# Patient Record
Sex: Female | Born: 1935 | Race: White | Hispanic: No | Smoking: Former smoker
Health system: Southern US, Community
[De-identification: ages and names within clinical notes are randomized; demographics above are authoritative.]

## PROBLEM LIST (undated history)

## (undated) DIAGNOSIS — I272 Pulmonary hypertension, unspecified: Secondary | ICD-10-CM

## (undated) DIAGNOSIS — I5032 Chronic diastolic (congestive) heart failure: Secondary | ICD-10-CM

## (undated) DIAGNOSIS — F0390 Unspecified dementia without behavioral disturbance: Secondary | ICD-10-CM

## (undated) DIAGNOSIS — N183 Chronic kidney disease, stage 3 (moderate): Secondary | ICD-10-CM

## (undated) DIAGNOSIS — I2510 Atherosclerotic heart disease of native coronary artery without angina pectoris: Secondary | ICD-10-CM

## (undated) DIAGNOSIS — I480 Paroxysmal atrial fibrillation: Secondary | ICD-10-CM

## (undated) DIAGNOSIS — I639 Cerebral infarction, unspecified: Secondary | ICD-10-CM

## (undated) DIAGNOSIS — I701 Atherosclerosis of renal artery: Secondary | ICD-10-CM

## (undated) DIAGNOSIS — R918 Other nonspecific abnormal finding of lung field: Secondary | ICD-10-CM

## (undated) DIAGNOSIS — I509 Heart failure, unspecified: Secondary | ICD-10-CM

## (undated) DIAGNOSIS — I4891 Unspecified atrial fibrillation: Secondary | ICD-10-CM

## (undated) DIAGNOSIS — I10 Essential (primary) hypertension: Secondary | ICD-10-CM

## (undated) DIAGNOSIS — A4181 Sepsis due to Enterococcus: Secondary | ICD-10-CM

## (undated) DIAGNOSIS — E039 Hypothyroidism, unspecified: Secondary | ICD-10-CM

## (undated) DIAGNOSIS — W5501XA Bitten by cat, initial encounter: Secondary | ICD-10-CM

## (undated) DIAGNOSIS — S300XXA Contusion of lower back and pelvis, initial encounter: Secondary | ICD-10-CM

## (undated) DIAGNOSIS — I716 Thoracoabdominal aortic aneurysm, without rupture: Secondary | ICD-10-CM

## (undated) HISTORY — PX: OTHER SURGICAL HISTORY: SHX169

## (undated) HISTORY — PX: ABDOMINAL HYSTERECTOMY: SHX81

## (undated) HISTORY — DX: Bitten by cat, initial encounter: W55.01XA

---

## 2000-06-07 ENCOUNTER — Ambulatory Visit (HOSPITAL_COMMUNITY): Admission: RE | Admit: 2000-06-07 | Discharge: 2000-06-08 | Payer: Self-pay | Admitting: Cardiovascular Disease

## 2000-06-07 ENCOUNTER — Encounter: Payer: Self-pay | Admitting: Cardiovascular Disease

## 2000-06-28 ENCOUNTER — Inpatient Hospital Stay (HOSPITAL_COMMUNITY): Admission: EM | Admit: 2000-06-28 | Discharge: 2000-07-05 | Payer: Self-pay | Admitting: Neurology

## 2000-06-29 ENCOUNTER — Encounter: Payer: Self-pay | Admitting: Neurology

## 2000-06-30 ENCOUNTER — Encounter: Payer: Self-pay | Admitting: Neurology

## 2000-06-30 ENCOUNTER — Encounter: Payer: Self-pay | Admitting: Pediatrics

## 2000-07-04 ENCOUNTER — Encounter: Payer: Self-pay | Admitting: Neurology

## 2000-07-05 ENCOUNTER — Inpatient Hospital Stay (HOSPITAL_COMMUNITY)
Admission: RE | Admit: 2000-07-05 | Discharge: 2000-07-15 | Payer: Self-pay | Admitting: Physical Medicine & Rehabilitation

## 2000-07-06 ENCOUNTER — Encounter: Payer: Self-pay | Admitting: Physical Medicine & Rehabilitation

## 2000-07-13 ENCOUNTER — Encounter: Payer: Self-pay | Admitting: Physical Medicine & Rehabilitation

## 2000-08-14 ENCOUNTER — Ambulatory Visit (HOSPITAL_COMMUNITY): Admission: RE | Admit: 2000-08-14 | Discharge: 2000-08-14 | Payer: Self-pay | Admitting: Neurology

## 2000-08-14 ENCOUNTER — Encounter: Payer: Self-pay | Admitting: Neurology

## 2000-10-03 ENCOUNTER — Other Ambulatory Visit: Admission: RE | Admit: 2000-10-03 | Discharge: 2000-10-03 | Payer: Self-pay | Admitting: Internal Medicine

## 2001-06-14 ENCOUNTER — Emergency Department (HOSPITAL_COMMUNITY): Admission: EM | Admit: 2001-06-14 | Discharge: 2001-06-14 | Payer: Self-pay | Admitting: Emergency Medicine

## 2001-06-14 ENCOUNTER — Encounter: Payer: Self-pay | Admitting: Emergency Medicine

## 2002-01-08 ENCOUNTER — Emergency Department (HOSPITAL_COMMUNITY): Admission: EM | Admit: 2002-01-08 | Discharge: 2002-01-08 | Payer: Self-pay | Admitting: Emergency Medicine

## 2003-10-13 ENCOUNTER — Emergency Department (HOSPITAL_COMMUNITY): Admission: EM | Admit: 2003-10-13 | Discharge: 2003-10-14 | Payer: Self-pay | Admitting: Emergency Medicine

## 2003-10-16 ENCOUNTER — Ambulatory Visit (HOSPITAL_COMMUNITY): Admission: RE | Admit: 2003-10-16 | Discharge: 2003-10-16 | Payer: Self-pay | Admitting: Emergency Medicine

## 2004-02-11 ENCOUNTER — Ambulatory Visit (HOSPITAL_COMMUNITY): Admission: RE | Admit: 2004-02-11 | Discharge: 2004-02-11 | Payer: Self-pay | Admitting: Cardiovascular Disease

## 2004-09-23 ENCOUNTER — Emergency Department (HOSPITAL_COMMUNITY): Admission: EM | Admit: 2004-09-23 | Discharge: 2004-09-23 | Payer: Self-pay | Admitting: Emergency Medicine

## 2006-02-04 ENCOUNTER — Ambulatory Visit (HOSPITAL_COMMUNITY): Admission: RE | Admit: 2006-02-04 | Discharge: 2006-02-04 | Payer: Self-pay | Admitting: Family Medicine

## 2009-11-23 ENCOUNTER — Inpatient Hospital Stay (HOSPITAL_COMMUNITY): Admission: EM | Admit: 2009-11-23 | Discharge: 2009-12-01 | Payer: Self-pay | Admitting: Emergency Medicine

## 2009-11-23 ENCOUNTER — Encounter (INDEPENDENT_AMBULATORY_CARE_PROVIDER_SITE_OTHER): Payer: Self-pay | Admitting: Cardiovascular Disease

## 2009-11-26 ENCOUNTER — Ambulatory Visit: Payer: Self-pay | Admitting: Surgery

## 2009-12-05 ENCOUNTER — Emergency Department (HOSPITAL_COMMUNITY): Admission: EM | Admit: 2009-12-05 | Discharge: 2009-12-05 | Payer: Self-pay | Admitting: Emergency Medicine

## 2009-12-23 ENCOUNTER — Ambulatory Visit: Payer: Self-pay | Admitting: Surgery

## 2009-12-23 ENCOUNTER — Encounter: Admission: RE | Admit: 2009-12-23 | Discharge: 2009-12-23 | Payer: Self-pay | Admitting: Surgery

## 2010-03-02 ENCOUNTER — Inpatient Hospital Stay (HOSPITAL_COMMUNITY): Admission: EM | Admit: 2010-03-02 | Discharge: 2010-03-06 | Payer: Self-pay | Admitting: Emergency Medicine

## 2010-03-08 ENCOUNTER — Emergency Department (HOSPITAL_COMMUNITY): Admission: EM | Admit: 2010-03-08 | Discharge: 2010-03-08 | Payer: Self-pay | Admitting: Emergency Medicine

## 2010-03-28 ENCOUNTER — Emergency Department (HOSPITAL_COMMUNITY): Admission: EM | Admit: 2010-03-28 | Discharge: 2010-03-29 | Payer: Self-pay | Admitting: Emergency Medicine

## 2010-05-26 ENCOUNTER — Emergency Department (HOSPITAL_COMMUNITY)
Admission: EM | Admit: 2010-05-26 | Discharge: 2010-05-26 | Payer: Self-pay | Source: Home / Self Care | Admitting: Emergency Medicine

## 2010-06-23 ENCOUNTER — Ambulatory Visit: Payer: Self-pay | Admitting: Surgery

## 2010-06-23 ENCOUNTER — Encounter
Admission: RE | Admit: 2010-06-23 | Discharge: 2010-06-23 | Payer: Self-pay | Source: Home / Self Care | Attending: Surgery | Admitting: Surgery

## 2010-07-19 ENCOUNTER — Encounter: Payer: Self-pay | Admitting: Cardiology

## 2010-07-20 ENCOUNTER — Inpatient Hospital Stay (HOSPITAL_COMMUNITY)
Admission: EM | Admit: 2010-07-20 | Discharge: 2010-07-28 | Disposition: A | Discharge status: Home / Self Care | Payer: Self-pay | Source: Home / Self Care | Attending: Cardiology | Admitting: Cardiology

## 2010-07-20 ENCOUNTER — Inpatient Hospital Stay (HOSPITAL_COMMUNITY)
Admission: EM | Admit: 2010-07-20 | Discharge: 2010-07-20 | Disposition: A | Discharge status: Other Acute Inpatient Hospital | Payer: Self-pay | Source: Home / Self Care

## 2010-07-21 LAB — POCT CARDIAC MARKERS
Myoglobin, poc: >500 ng/mL (ref 12–200)
Troponin i, poc: <0.05 ng/mL (ref 0.00–0.09)

## 2010-07-21 LAB — CBC
HCT: 36.2 % (ref 36.0–46.0)
Hemoglobin: 11.9 g/dL — ABNORMAL LOW (ref 12.0–15.0)
MCH: 30.2 pg (ref 26.0–34.0)
RBC: 3.94 MIL/uL (ref 3.87–5.11)
WBC: 5.6 10*3/uL (ref 4.0–10.5)

## 2010-07-21 LAB — COMPREHENSIVE METABOLIC PANEL
Albumin: 3.9 g/dL (ref 3.5–5.2)
Alkaline Phosphatase: 40 U/L (ref 39–117)
CO2: 35 mEq/L — ABNORMAL HIGH (ref 19–32)
Calcium: 9.3 mg/dL (ref 8.4–10.5)
GFR calc Af Amer: 27 mL/min — ABNORMAL LOW (ref >60)
GFR calc non Af Amer: 23 mL/min — ABNORMAL LOW (ref >60)
Glucose, Bld: 103 mg/dL — ABNORMAL HIGH (ref 70–99)
Sodium: 141 mEq/L (ref 135–145)

## 2010-07-21 LAB — DIFFERENTIAL
Basophils Absolute: 0 10*3/uL (ref 0.0–0.1)
Basophils Relative: 1 % (ref 0–1)
Eosinophils Absolute: 0.3 10*3/uL (ref 0.0–0.7)
Eosinophils Relative: 6 % — ABNORMAL HIGH (ref 0–5)
Lymphocytes Relative: 47 % — ABNORMAL HIGH (ref 12–46)

## 2010-07-21 LAB — CK TOTAL AND CKMB (NOT AT ARMC)
CK, MB: 9.3 ng/mL (ref 0.3–4.0)
Total CK: 140 U/L (ref 7–177)

## 2010-07-21 LAB — MAGNESIUM: Magnesium: 2.3 mg/dL (ref 1.5–2.5)

## 2010-07-21 LAB — MRSA PCR SCREENING: MRSA by PCR: NEGATIVE

## 2010-07-21 LAB — BRAIN NATRIURETIC PEPTIDE: Pro B Natriuretic peptide (BNP): 319 pg/mL — ABNORMAL HIGH (ref 0.0–100.0)

## 2010-07-21 LAB — PROTIME-INR: INR: 1.02 (ref 0.00–1.49)

## 2010-07-22 LAB — BASIC METABOLIC PANEL
BUN: 23 mg/dL (ref 6–23)
CO2: 30 mEq/L (ref 19–32)
CO2: 29 mEq/L (ref 19–32)
Calcium: 8.4 mg/dL (ref 8.4–10.5)
Chloride: 105 mEq/L (ref 96–112)
Creatinine, Ser: 1.58 mg/dL — ABNORMAL HIGH (ref 0.4–1.2)
GFR calc Af Amer: 39 mL/min — ABNORMAL LOW (ref >60)
GFR calc non Af Amer: 27 mL/min — ABNORMAL LOW (ref >60)
Glucose, Bld: 91 mg/dL (ref 70–99)
Glucose, Bld: 97 mg/dL (ref 70–99)
Potassium: 3.8 mEq/L (ref 3.5–5.1)
Sodium: 139 mEq/L (ref 135–145)

## 2010-07-22 LAB — TSH
TSH: 3.547 u[IU]/mL (ref 0.350–4.500)
TSH: 3.686 u[IU]/mL (ref 0.350–4.500)

## 2010-07-22 LAB — LIPID PANEL
HDL: 49 mg/dL (ref >39)
Triglycerides: 108 mg/dL (ref <150)

## 2010-07-22 LAB — T4, FREE: Free T4: 0.42 ng/dL — ABNORMAL LOW (ref 0.80–1.80)

## 2010-07-22 LAB — CARDIAC PANEL(CRET KIN+CKTOT+MB+TROPI): Total CK: 123 U/L (ref 7–177)

## 2010-07-22 LAB — PROTIME-INR
INR: 1.1 (ref 0.00–1.49)
Prothrombin Time: 14.4 seconds (ref 11.6–15.2)

## 2010-07-22 LAB — CBC
Hemoglobin: 10.2 g/dL — ABNORMAL LOW (ref 12.0–15.0)
MCH: 29.1 pg (ref 26.0–34.0)
MCH: 29.2 pg (ref 26.0–34.0)
MCHC: 31.4 g/dL (ref 30.0–36.0)
MCHC: 30.8 g/dL (ref 30.0–36.0)
MCV: 92.9 fL (ref 78.0–100.0)
MCV: 94.6 fL (ref 78.0–100.0)
Platelets: 177 10*3/uL (ref 150–400)
RBC: 3.5 MIL/uL — ABNORMAL LOW (ref 3.87–5.11)
RDW: 14.9 % (ref 11.5–15.5)

## 2010-07-22 LAB — CK TOTAL AND CKMB (NOT AT ARMC)
CK, MB: 13.8 ng/mL (ref 0.3–4.0)
Relative Index: 6.9 — ABNORMAL HIGH (ref 0.0–2.5)
Relative Index: 6.9 — ABNORMAL HIGH (ref 0.0–2.5)
Total CK: 200 U/L — ABNORMAL HIGH (ref 7–177)

## 2010-07-22 LAB — TROPONIN I: Troponin I: 3.46 ng/mL (ref 0.00–0.06)

## 2010-07-22 LAB — HEPARIN LEVEL (UNFRACTIONATED): Heparin Unfractionated: 0.31 IU/mL (ref 0.30–0.70)

## 2010-07-22 LAB — MAGNESIUM: Magnesium: 2.4 mg/dL (ref 1.5–2.5)

## 2010-07-23 LAB — CBC
Hemoglobin: 8.7 g/dL — ABNORMAL LOW (ref 12.0–15.0)
MCV: 93 fL (ref 78.0–100.0)
Platelets: 177 10*3/uL (ref 150–400)
RBC: 3 MIL/uL — ABNORMAL LOW (ref 3.87–5.11)
WBC: 4.7 10*3/uL (ref 4.0–10.5)

## 2010-07-23 LAB — IRON AND TIBC: TIBC: 262 ug/dL (ref 250–470)

## 2010-07-23 LAB — HEPARIN LEVEL (UNFRACTIONATED): Heparin Unfractionated: 0.27 IU/mL — ABNORMAL LOW (ref 0.30–0.70)

## 2010-07-23 LAB — HEMOGLOBIN AND HEMATOCRIT, BLOOD
HCT: 30.5 % — ABNORMAL LOW (ref 36.0–46.0)
Hemoglobin: 9.6 g/dL — ABNORMAL LOW (ref 12.0–15.0)

## 2010-07-23 LAB — PROTIME-INR: Prothrombin Time: 14.3 seconds (ref 11.6–15.2)

## 2010-07-23 NOTE — H&P (Addendum)
Brandy Mcguire, HICKAM             ACCOUNT NO.:  [id number]  MEDICAL RECORD NO.:  [id number]          PATIENT TYPE:  EMS  LOCATION:  ED                            FACILITY:  [facility]  PHYSICIAN:  Elliot Cousin, M.D.    DATE OF BIRTH:  1933-10-22  DATE OF ADMISSION:  07/20/2010 DATE OF DISCHARGE:  LH                             HISTORY & PHYSICAL   PRIMARY CARE PHYSICIAN:  Dr. Assunta Found.  PRIMARY CARDIOLOGIST:  Dr. Nanetta Batty.  PRIMARY VASCULAR SURGEON:  Evelene Croon, M.D.  CHIEF COMPLAINT:  Chest pain and shortness of breath.  HISTORY OF PRESENT ILLNESS:  The patient is a 77 year old woman with a past medical history significant for coronary artery disease, status post stenting in the past, chronic diastolic congestive heart failure, oxygen-dependent COPD, and stage III chronic kidney disease.  She presents to the emergency department today with a chief complaint of chest pain and shortness of breath.  The patient got up this morning in her usual state of health.  When she got up, she became dizzy.  She described the dizziness as a spinning dizziness and also lightheadedness.  Shortly thereafter, she developed chest pain in the substernal area.  This was accompanied by shortness of breath.  She rated the chest pain at that time as  4 or 5/10 in intensity.  She describes it as a pressure.  She took 1 sublingual nitroglycerin, and it decreased the pain to  2/10 in intensity.  She had no associated diaphoresis, nausea, or radiation.  She did acknowledge that she felt her heart racing when she developed chest pain.  During the initial evaluation in the emergency department by Dr. Adriana Simas, the patient was noted to be in atrial fibrillation with a heart rate of 136 beats per minute.  The EKG revealed atrial fibrillation, bifascicular block, and a heart rate of 134 beats per minute.  She was given 10 mg of IV Cardizem.  She converted to normal sinus rhythm.  Her initial cardiac  markers are negative with the exception of a myoglobin of greater than 500.  Her serum sodium is within normal limits at 141, and her serum potassium is low normal at 3.6.  Her BUN is  24, and her creatinine is elevated at 2.14.  She is being admitted for further evaluation and management.  PAST MEDICAL HISTORY: 1. Coronary artery disease with a history of non-ST elevation     myocardial infarction and right coronary artery bare metal stenting     x2 in June 2011 by Dr. Nanetta Batty. 2. Chronic diastolic congestive heart failure. 3. Oxygen-dependent COPD with emphysema. 4. Pulmonary hypertension. 5. Hypertension. 6. Hypothyroidism. 7. Stage III chronic kidney disease. 8. History of renal artery stenosis, status post stent to the left     renal artery in December 2001. 9. Thoracic aortic aneurysm, which is being followed by Dr. Laneta Simmers. 10.Stable pulmonary nodules. 11.Iron deficiency anemia. 12.History of previous strokes.  MEDICATIONS: 1. Levothyroxine 25 mcg daily. 2. Crestor 20 mg q.h.s. 3. Stool softener 100 mg q.h.s. 4. Lasix 20 mg daily. 5. Sublingual nitroglycerin 0.4 mg sublingual every 5 minutes x3 as  needed for chest pain. 6. Fish oil capsule 1000 mg q.h.s. 7. Centrum Silver once daily. 8. Isosorbide mononitrate 30 mg daily. 9. Protonix 40 mg daily. 10.Plavix 75 mg daily. 11.Aspirin 325 mg daily. 12.Symbicort 80/4.5 mg 2 puffs twice daily as needed. 13.Oxygen 3 liters per minute.  ALLERGIES:  NO KNOWN DRUG ALLERGIES.  SOCIAL HISTORY:  The patient is widowed.  [place]  Her son, Gerlene Burdock, lives with her.  She is retired.  She quit smoking in 2002.  She denies alcohol and illicit drug use.  She does not smoke.  She ambulates mostly without a walker.  She has 5 children.  FAMILY HISTORY:  Her mother died of an intestinal cancer at 29 years of age.  Her father died of a blood clot at 60 years of age.  REVIEW OF SYSTEMS:  As  above in the history present illness, otherwise review of systems is negative.  PHYSICAL EXAMINATION:  Temperature 97.7, blood pressure 138/93, pulse 69, respiratory rate 20, oxygen saturation 100% on 3 liters of oxygen. GENERAL: The patient is a pleasant, alert, elderly 77 year old Caucasian woman who is currently sitting up in bed in no acute distress. HEENT:  Head is normocephalic nontraumatic.  Pupils are equal, round, and reactive to light.  Extraocular muscles are intact.  Conjunctivae are clear.  Sclerae are white.  Tympanic membranes not examined.  Nasal mucosa is mildly dry.  No sinus tenderness.  Oropharynx reveals no teeth.  Mucous membranes are moist.  No posterior exudates or erythema. NECK:  Supple.  No adenopathy, no thyromegaly, no bruit or JVD. LUNGS:  Occasional crackles and a few wheezes auscultated bilaterally. Breathing is nonlabored. HEART:  S1-S2 with a soft systolic murmur. ABDOMEN:  Obese positive bowel sounds.  Soft, nontender, nondistended. No hepatosplenomegaly.  No masses palpated. GU/RECTAL:  Deferred. EXTREMITIES:  Pedal pulses are palpable bilaterally.  Only a trace of pedal edema bilaterally.NEUROLOGIC:  The patient is alert and oriented x3.  Cranial nerves II- XII are intact.  Strength is 5/5 throughout.  Sensation is intact.  ADMISSION LABORATORIES:  EKG #1 reveals atrial fibrillation with rapid ventricular response, right bundle branch block, left anterior fascicular block, and a heart rate of 134 beats per minute.  EKG #2 reveals normal sinus rhythm with bifascicular block and a heart rate of 66 beats per minute.  Chest x-ray reveals borderline enlargement of cardiac silhouette. Bronchitic changes.  Sodium 141, potassium 3.6, chloride 97, CO2 35, glucose 103, BUN 24, creatinine 2.14, total bilirubin 0.5, alkaline phosphatase 40, SGOT 27, SGPT 10, total protein 7.9, albumin 3.9, calcium 9.3.  WBC 5.6, hemoglobin 11.9, platelet count  226.  ASSESSMENT: 1. Chest pain with associated shortness of breath and palpitations.     It is likely that the patient's symptoms were secondary to     paroxysmal atrial fibrillation with rapid ventricular response.     However, given her history of coronary artery disease, myocardial     infarction, and/or ischemia are concerns. 2. Coronary artery disease with a history of myocardial infarction and     stenting of the right coronary artery.  The patient is treated with     antiplatelet therapy including Plavix and aspirin.  Her EKG reveals     a bifascicular block. 3. Chronic diastolic congestive heart failure and pulmonary     hypertension per 2-D echocardiogram in May 2011. 4. Oxygen-dependent chronic obstructive pulmonary disease with     emphysema.  The patient has a few  crackles and a couple of wheezes     on exam, but it does not appear that she has exacerbation of her     chronic obstructive pulmonary disease. 5. Stage III chronic kidney disease.  Her creatinine appears to be at     baseline. 6. Chronic pulmonary nodules and  aortic aneurysm.  The     patient is followed once or twice yearly by Dr. Laneta Simmers.  A CT scan     of her chest in December 2011 revealed no significant change in the     thoracic aortic aneurysm.  The pulmonary nodules remained virtually     unchanged. But a follow-up CT of the chest in 6 months was     recommended by the radiologist.  PLAN: 1. The patient was given a 10 mg IV dose of Cardizem.. 2. We will start oral Cardizem at 60 days mg q.8h. initially. 3. We will add Coumadin and hold aspirin.  We will continue Plavix.     We will discuss this further with her cardiologist. 4. We will consult cardiologist, Dr. Allyson Sabal or colleague, for further     management recommendations. 5. For further evaluation, we will order a magnesium level, TSH, free     T4, cardiac enzymes, 2-D echocardiogram, and fasting lipid panel.     Elliot Cousin,  M.D.     DF/MEDQ  D:  07/20/2010  T:  07/20/2010  Job:  [id number]  cc:   Nanetta Batty, M.D. Fax: [phone]  Corrie Mckusick, M.D. Fax: [phone]  Evelene Croon, M.D. [address] [place] [address] [place]  Electronically Signed by Elliot Cousin M.D. on 07/23/2010 03:06:42 PM

## 2010-07-24 LAB — PROTIME-INR: INR: 1.09 (ref 0.00–1.49)

## 2010-07-24 LAB — CBC
HCT: 28.8 % — ABNORMAL LOW (ref 36.0–46.0)
Hemoglobin: 9 g/dL — ABNORMAL LOW (ref 12.0–15.0)
MCV: 92.6 fL (ref 78.0–100.0)
WBC: 5.7 10*3/uL (ref 4.0–10.5)

## 2010-07-24 LAB — BRAIN NATRIURETIC PEPTIDE: Pro B Natriuretic peptide (BNP): 305 pg/mL — ABNORMAL HIGH (ref 0.0–100.0)

## 2010-07-24 LAB — BASIC METABOLIC PANEL
Chloride: 100 mEq/L (ref 96–112)
GFR calc Af Amer: 33 mL/min — ABNORMAL LOW (ref >60)
Potassium: 4.3 mEq/L (ref 3.5–5.1)

## 2010-07-25 LAB — BASIC METABOLIC PANEL
BUN: 31 mg/dL — ABNORMAL HIGH (ref 6–23)
GFR calc Af Amer: 26 mL/min — ABNORMAL LOW (ref >60)
GFR calc non Af Amer: 21 mL/min — ABNORMAL LOW (ref >60)
GFR calc non Af Amer: 17 mL/min — ABNORMAL LOW (ref >60)
Glucose, Bld: 107 mg/dL — ABNORMAL HIGH (ref 70–99)
Potassium: 3.7 mEq/L (ref 3.5–5.1)
Potassium: 4 mEq/L (ref 3.5–5.1)
Sodium: 137 mEq/L (ref 135–145)

## 2010-07-25 LAB — BRAIN NATRIURETIC PEPTIDE: Pro B Natriuretic peptide (BNP): 190 pg/mL — ABNORMAL HIGH (ref 0.0–100.0)

## 2010-07-25 LAB — ABO/RH: ABO/RH(D): O POS

## 2010-07-25 LAB — GLUCOSE, CAPILLARY
Glucose-Capillary: 86 mg/dL (ref 70–99)
Glucose-Capillary: 124 mg/dL — ABNORMAL HIGH (ref 70–99)

## 2010-07-25 LAB — PROTIME-INR
INR: 1.21 (ref 0.00–1.49)
Prothrombin Time: 15.5 seconds — ABNORMAL HIGH (ref 11.6–15.2)

## 2010-07-25 LAB — TYPE AND SCREEN

## 2010-07-25 LAB — CBC
Hemoglobin: 9.1 g/dL — ABNORMAL LOW (ref 12.0–15.0)
MCH: 28.8 pg (ref 26.0–34.0)
MCHC: 30.8 g/dL (ref 30.0–36.0)
Platelets: 214 10*3/uL (ref 150–400)

## 2010-07-25 LAB — TROPONIN I: Troponin I: 0.16 ng/mL — ABNORMAL HIGH (ref 0.00–0.06)

## 2010-07-26 LAB — CBC
Hemoglobin: 8.9 g/dL — ABNORMAL LOW (ref 12.0–15.0)
MCHC: 31.3 g/dL (ref 30.0–36.0)
Platelets: 209 10*3/uL (ref 150–400)
RDW: 14.7 % (ref 11.5–15.5)

## 2010-07-26 LAB — BASIC METABOLIC PANEL
BUN: 31 mg/dL — ABNORMAL HIGH (ref 6–23)
Calcium: 8.6 mg/dL (ref 8.4–10.5)
Creatinine, Ser: 2.57 mg/dL — ABNORMAL HIGH (ref 0.4–1.2)
GFR calc non Af Amer: 18 mL/min — ABNORMAL LOW (ref >60)
Potassium: 3.9 mEq/L (ref 3.5–5.1)

## 2010-07-26 LAB — PROTIME-INR
INR: 1.13 (ref 0.00–1.49)
Prothrombin Time: 14.7 seconds (ref 11.6–15.2)

## 2010-07-26 LAB — CK TOTAL AND CKMB (NOT AT ARMC): Relative Index: INVALID (ref 0.0–2.5)

## 2010-07-27 LAB — BASIC METABOLIC PANEL
CO2: 31 mEq/L (ref 19–32)
Calcium: 9 mg/dL (ref 8.4–10.5)
Chloride: 93 mEq/L — ABNORMAL LOW (ref 96–112)
GFR calc Af Amer: 26 mL/min — ABNORMAL LOW (ref >60)
Sodium: 138 mEq/L (ref 135–145)

## 2010-07-27 LAB — PROTIME-INR
INR: 1.09 (ref 0.00–1.49)
Prothrombin Time: 14.3 seconds (ref 11.6–15.2)

## 2010-07-27 LAB — CBC
Hemoglobin: 9.1 g/dL — ABNORMAL LOW (ref 12.0–15.0)
Platelets: 228 10*3/uL (ref 150–400)
RBC: 3.09 MIL/uL — ABNORMAL LOW (ref 3.87–5.11)

## 2010-07-27 LAB — BRAIN NATRIURETIC PEPTIDE: Pro B Natriuretic peptide (BNP): 178 pg/mL — ABNORMAL HIGH (ref 0.0–100.0)

## 2010-07-27 LAB — FOLATE RBC: RBC Folate: 2218 ng/mL — ABNORMAL HIGH (ref 180–600)

## 2010-07-28 LAB — BASIC METABOLIC PANEL
Calcium: 9.2 mg/dL (ref 8.4–10.5)
GFR calc Af Amer: 29 mL/min — ABNORMAL LOW (ref >60)
GFR calc non Af Amer: 24 mL/min — ABNORMAL LOW (ref >60)
Glucose, Bld: 85 mg/dL (ref 70–99)
Potassium: 4.3 mEq/L (ref 3.5–5.1)
Sodium: 136 mEq/L (ref 135–145)

## 2010-07-28 LAB — CBC
HCT: 28.1 % — ABNORMAL LOW (ref 36.0–46.0)
Hemoglobin: 8.9 g/dL — ABNORMAL LOW (ref 12.0–15.0)
RDW: 14.2 % (ref 11.5–15.5)
WBC: 4.6 10*3/uL (ref 4.0–10.5)

## 2010-07-28 LAB — PROTIME-INR: INR: 1.02 (ref 0.00–1.49)

## 2010-07-29 NOTE — Procedures (Signed)
NAME:  DAFINA, SUK NO.:  [id number]  MEDICAL RECORD NO.:  [id number]          PATIENT TYPE:  INP  LOCATION:  2919                         FACILITY:  [facility]  PHYSICIAN:  Nicki Guadalajara, M.D.     DATE OF BIRTH:  [birth year]  DATE OF PROCEDURE:  07/21/2010 DATE OF DISCHARGE:                           CARDIAC CATHETERIZATION   INDICATIONS:  Ms. Brandy Mcguire is a 77 year old female who had a history of multiple medical problems, which include documented coronary artery disease, renal insufficiency, status post left renal artery stenting with solitary kidney, large thoracoabdominal aneurysm, COPD, hypothyroidism.  She had in May 2011 cardiac catheterization, which revealed 50% to 60% left main stenosis as well as irregularities in the LAD, 50% to 60% proximal circumflex stenosis, and high-grade proximal and distal RCA stenoses.  At that time, she underwent surgical evaluation, was turned down for surgery.  She underwent stenting of the proximal and distal right coronary artery.  The patient presented to [facility] yesterday in rapid atrial fibrillation with associated ST-T changes.  She subsequently converted to sinus rhythm, but has ruled in for non-ST-segment elevation MI with troponin increasing to 4.05 and CK-MB of 13.8.  She is now referred for cardiac catheterization.  She has been hydrated.  PROCEDURE:  After premedication with Versed 12 mg plus fentanyl 25 mcg, the patient was prepped and draped in usual fashion.  The right femoral artery was punctured anteriorly.  Initially, a 5-French sheath was inserted.  Due to a large thoracoabdominal aneurysm, a Glidewire was necessary to navigate the aorta.  This was advanced to the aortic root. A 5-French sheath was then removed and exchanged for a 6-French 35 cm Brite Tip Cordis sheath.  Exchange technique was necessary throughout the entire procedure with long wire.  The left coronary system  was injected with a 6-French FL-4 catheter.  Initially, a 6-French AR-2 catheter was inserted, but this was unable to selectively engage the right coronary artery.  This was changed to a no torque catheter, which also was unsuccessful.  Ultimately, a 6-French XB RCA guide with side holes was necessary for selective cannulation to the right coronary artery.  All exchanges were done with a long wire exchange.  A 6-French pigtail catheter was then inserted and the catheter was able to cross the aortic valve, which was mildly calcified into the left ventricle. Pressures were recorded.  Due to the patient's renal insufficiency with a baseline creatinine of 1.85 and a solitary kidney, left ventriculography was not performed nor was distal aortography to further reevaluate the thoracoabdominal extensive aneurysm as well as prior left renal artery stents.  All wires and catheters were removed from the patient.  Hemostasis was obtained by direct manual pressure.  The patient tolerated the procedure well.  HEMODYNAMIC DATA:  Initial central aortic pressure was 94/57.  Initial LV pressure was 95/50.  Post A-wave 25.  On pullback, LV pressure was 119/7, post A-wave 21, central aortic pressure 125/68.  There was evidence for coronary calcification involving the LAD system, circumflex, and right coronary artery.  The left main coronary artery had previously noted 60% mid stenosis.  The LAD had mild 20% proximal narrowing, 20% mid narrowing.  There were luminal irregularities and 40% distal narrowing.  This gave rise to several diagonal vessels and septal perforating arteries and was free of critical stenoses.  The circumflex vessel had 40% to 50% narrowing proximally on the proximal bend of the vessel before the first marginal branch.  The right coronary artery had ostial calcification.  The stent in the proximal right coronary artery was patent, but there was intimal hyperplasia with narrowing in  the mid segment of about 40% to 50%.  The stent in the distal RCA was patent with intimal hyperplasia narrowing of 30%.  The RCA supplied a PDA and a small PLA vessel with luminal irregularities.  IMPRESSION:  Multivessel coronary artery disease with no significant change in the 60% left main stenosis; calcification of the left anterior descending system with luminal irregularities of 20% in the proximal and mid segment, 40% distal narrowing; 40% to 50% proximal stenosis in the circumflex coronary artery; ostial calcification of the right coronary artery with proximal RCA stent with intimal hyperplasia narrowing of 40% to 50% and 30% narrowing in the distal RCA stent.  RECOMMENDATIONS:  Medical therapy.  The patient did rule in for non-ST- segment elevation myocardial infarction.  Per yesterday's presentation with atrial fibrillation with rapid ventricular response, she did ST-T changes, which improved and normalized following rate control and conversion to sinus rhythm.  Increased medical therapy will be recommended.  The patient will be aggressively hydrated.          ______________________________ Nicki Guadalajara, M.D.     TK/MEDQ  D:  07/21/2010  T:  07/22/2010  Job:  [id number]  cc:   Landry Corporal, MD  Electronically Signed by Nicki Guadalajara M.D. on 07/29/2010 02:40:02 PM

## 2010-08-14 NOTE — Discharge Summary (Signed)
Brandy Mcguire, Brandy Mcguire             ACCOUNT NO.:  [id number]  MEDICAL RECORD NO.:  [id number]          PATIENT TYPE:  INP  LOCATION:  2910                         FACILITY:  [facility]  PHYSICIAN:  Italy Hilty, MD         DATE OF BIRTH:  06-05-1934  DATE OF ADMISSION:  07/20/2010 DATE OF DISCHARGE:  07/28/2010                              DISCHARGE SUMMARY   DISCHARGE DIAGNOSES: 1. Non-ST-elevation myocardial infarction required medical treatment. 2. History of hypertension. 3. Congestive heart failure, diastolic grade one. 4. Chronic kidney disease, stage III. 5. Atrial fibrillation with rapid ventricular response. 6. Coronary artery disease status post bare-metal stent x2 to the     right coronary artery in June 2011. 7. Chronic obstructive pulmonary disease, emphysema, O2 dependent. 8. Hypothyroid. 9. Renal artery stenosis status post stent to the right renal artery     in December 2001. 10.Abdominal aortic aneurysm followed by Dr. Laneta Simmers. 11.Anemia, iron-deficiency. 12.History of cerebrovascular accident.  HOSPITAL COURSE:  Brandy Mcguire is a 77 year old female with past medical history of coronary artery disease status post bare-metal stent to the right coronary artery in June 2011, chronic diastolic congestive heart failure, oxygen-dependent COPD, stage III chronic kidney disease, hypertension, hypothyroidism, renal artery stenosis status post stent to the right renal artery in December 2001, abdominal aortic aneurysm, iron- deficiency anemia, and history of CVA.  She presented to the emergency department on the 23rd with complaints of chest pain and shortness of breath.  Upon evaluation in the ER, she was found to be in atrial fibrillation with bifascicular block, heart rate of 134 beats per minute.  She was started on 10 mg of IV Cardizem and converted to normal sinus rhythm.  She was admitted for cardiac workup for acute coronary syndrome and her atrial fibrillation.  The  patient's peak troponin was 4.05.  She was started on oral Cardizem initially and as well as started on Coumadin and her Plavix was continued and also ordered was a magnesium level, TSH, free T4, additional cardiac enzymes x4, 2-D echocardiogram, and fasting lipid panel.  Brandy Mcguire was subsequently scheduled for cardiac catheterization on July 21, 2010.  At that time, she had no complaints of chest pain.  Cardiac cath revealed multivessel coronary artery disease with no significant change in the 60% left main stenosis.  There were luminal irregularities 20% in the proximal and mid segment of the left anterior descending as well as 40% distal narrowing and 43% proximal stenosis in the circumflex coronary artery.  There was also ostial calcification of the right coronary artery with proximal RCA stent with intimal hyperplasia narrowing of 40% to 50% and 30% narrowing in the distal RCA stent.  Recommendations were for medical therapy.  Cardiac rehabilitation was initiated.  A 2-D echocardiogram on July 22, 2010, revealed ejection fraction of 60% to 65% with mild LVH, normal systolic function, moderate tricuspid regurgitation and PA pressure of 39 mmHg.  On the 26th, the patient was complaining of shortness of breath at rest.  Hemoglobin level at 8.7. Iron studies were completed as well as a Hemoccult of her stool.  Iron and percent  saturation were low.  The nurse called and stated that the patient's blood pressure is 180/106 with pulse of 83.  The patient was given 25 mg of p.o. Lopressor and subsequently 10 mg of IV hydralazine. The patient had a resulting decrease in her blood pressure to 130/80. BNP was also ordered and found to be 305.  The patient was given 40 mg of IV Lasix.  She had good urinary output as a result of over a liter. On the 27th, the patient had no complaints.  The patient was also started on incentive spirometry.  Subsequent BNP checks showed a decrease, the  next one was 190.  The patient continued to deny any chest pain or shortness of breath.  On the 28th, the patient had a systolic blood pressure of 60 and diastolic of 45.  She was given 250 normal saline bolus without change in blood pressure and bolus was repeated. EKG was ordered.  The patient was started on IV dopamine at 3 mg/kg per minute and titrated to keep the systolic pressure greater than 90.  She was transferred to the ICU and Lopressor was held.  On the 29th, the patient's blood pressure was 140/68 with heart rate of 59.  The patient stated she was feeling better and she was off the dopamine.  The patient continued without complaints.  She had been seen by Dr. Rennis Golden on the 31st who feels she is stable for discharge.  The patient feels much better.  DISCHARGE LABS:  WBC is 4.6, hemoglobin 8.9, hematocrit 28.1, platelets 211.  PT 13.6, INR 1.02.  Sodium 136, potassium 4.3, chloride 94, carbon dioxide 33, glucose 85, BUN 25, creatinine 2.02, calcium 9.2.  BNP is 220.0.  Vitamin B12 was 889, folate was 2218.  Total cholesterol 151, triglycerides 108, HDL 49, LDL 80, free T4 was 0.42.  TSH was 3.686.  STUDIES/PROCEDURES: 1. Last chest x-ray was on July 26, 2010.  Impression:  Resolution     of airspace disease with minimal opacity remaining at the right     lung base, most consistent with atelectasis. 2. Cardiac catheterization July 21, 2010.  Impression:  Multivessel     coronary artery disease with no significant change in the 60% left     main stenosis.  Calcification in the left anterior descending     system with luminal irregularities of 20% proximal and mid segment,     40% distal narrowing, 40% to 50% proximal stenosis in the     circumflex coronary artery, ostial calcification of the right     coronary artery within proximal RCA stent with intimal hyperplasia     narrowing of 40% to 50%, 30% narrowing of the distal RCA stent.     Recommendations are for medical  therapy. 3. A 2-D echocardiogram July 22, 2010.  The conclusion is left     ventricle cavity size was normal, wall thickness was increased and     mild LVH.  There was mild concentric hypertrophy.  Systolic     function was normal with an ejection fraction estimated at 60% to     65%.  Normal wall motion.  Tricuspid valve showed moderate     regurgitation.  Pulmonary artery peak PA pressure was 39 mmHg.  DISCHARGE MEDICATIONS: 1. Symbicort 2 puffs inhaled twice daily. 2. Diltiazem 120 mg capsules CD/ER 1 tablet by mouth daily. 3. Iron complex 150 mg 1 tablet by mouth twice daily. 4. Lasix 40 mg 1 tablet by mouth daily.  5. Metoprolol tartrate 25 mg one-half tablet twice daily. 6. MiraLax 17 g by mouth daily over the counter mixed in water. 7. Acetaminophen 325 mg 2 tablets by mouth every 4 hours as needed for     pain. 8. Aspirin 325 mg enteric-coated 1 tablet by mouth daily. 9. Plavix 75 mg 1 tablet by mouth daily with meals. 10.Fish oil over the counter 1 tablet by mouth daily. 11.Imdur 30 mg 1 tablet by mouth daily. 12.Synthroid 25 mcg 1 tablet by mouth daily before breakfast. 13.Multivitamin/iron over the counter 1 tablet by mouth daily. 14.Nitroglycerin sublingual 0.4 mg 1 tablet under the tongue every 5    minutes as needed for chest pain up to 3 doses. 15.Protonix 40 mg 1 tablet by mouth daily. 16.Rosuvastatin 20 mg 1 tablet by mouth daily.  DISPOSITION:  Ms. Solanki will be discharged home in stable condition. She is to increase her activity slowly.  May shower and bathe.  She is to eat a low-sodium heart-healthy diet.  She is to follow up with Dr. Allyson Sabal in [place] on August 13, 2010, at 3:15 p.m.  She has been instructed to follow heart failure instructions on the reverse of the pink sheet. She is advised to call if she has a 1-2 pounds weight gain in 1 day or 3-4 pounds weight gain in a week.    ______________________________ Wilburt Finlay,  PA   ______________________________ [place] Hilty, MD    BH/MEDQ  D:  07/28/2010  T:  07/29/2010  Job:  [id number]  cc:   Evelene Croon, M.D. Nanetta Batty, M.D. Corrie Mckusick, M.D.  Electronically Signed by Wilburt Finlay PA on 08/12/2010 03:27:19 PM Electronically Signed by Kirtland Bouchard. HILTY M.D. on 08/14/2010 11:17:53 AM

## 2010-09-08 LAB — BASIC METABOLIC PANEL
Calcium: 9.6 mg/dL (ref 8.4–10.5)
Chloride: 96 mEq/L (ref 96–112)
Creatinine, Ser: 2.17 mg/dL — ABNORMAL HIGH (ref 0.4–1.2)
GFR calc Af Amer: 27 mL/min — ABNORMAL LOW (ref >60)
Sodium: 140 mEq/L (ref 135–145)

## 2010-09-08 LAB — CBC
MCV: 90.8 fL (ref 78.0–100.0)
Platelets: 225 10*3/uL (ref 150–400)
RBC: 3.81 MIL/uL — ABNORMAL LOW (ref 3.87–5.11)
WBC: 6.3 10*3/uL (ref 4.0–10.5)

## 2010-09-08 LAB — DIFFERENTIAL
Lymphocytes Relative: 46 % (ref 12–46)
Lymphs Abs: 2.9 10*3/uL (ref 0.7–4.0)
Neutrophils Relative %: 39 % — ABNORMAL LOW (ref 43–77)

## 2010-09-08 LAB — POCT CARDIAC MARKERS
Myoglobin, poc: 203 ng/mL (ref 12–200)
Troponin i, poc: <0.05 ng/mL (ref 0.00–0.09)
Troponin i, poc: <0.05 ng/mL (ref 0.00–0.09)

## 2010-09-10 LAB — BASIC METABOLIC PANEL
BUN: 23 mg/dL (ref 6–23)
BUN: 24 mg/dL — ABNORMAL HIGH (ref 6–23)
BUN: 28 mg/dL — ABNORMAL HIGH (ref 6–23)
BUN: 34 mg/dL — ABNORMAL HIGH (ref 6–23)
CO2: 30 mEq/L (ref 19–32)
CO2: 33 mEq/L — ABNORMAL HIGH (ref 19–32)
CO2: 34 mEq/L — ABNORMAL HIGH (ref 19–32)
CO2: 34 mEq/L — ABNORMAL HIGH (ref 19–32)
CO2: 36 mEq/L — ABNORMAL HIGH (ref 19–32)
Calcium: 8.7 mg/dL (ref 8.4–10.5)
Calcium: 8.9 mg/dL (ref 8.4–10.5)
Chloride: 90 mEq/L — ABNORMAL LOW (ref 96–112)
Chloride: 91 mEq/L — ABNORMAL LOW (ref 96–112)
Chloride: 91 mEq/L — ABNORMAL LOW (ref 96–112)
Chloride: 93 mEq/L — ABNORMAL LOW (ref 96–112)
Chloride: 95 mEq/L — ABNORMAL LOW (ref 96–112)
Creatinine, Ser: 1.61 mg/dL — ABNORMAL HIGH (ref 0.4–1.2)
Creatinine, Ser: 1.66 mg/dL — ABNORMAL HIGH (ref 0.4–1.2)
Creatinine, Ser: 2.35 mg/dL — ABNORMAL HIGH (ref 0.4–1.2)
Creatinine, Ser: 2.39 mg/dL — ABNORMAL HIGH (ref 0.4–1.2)
GFR calc Af Amer: 24 mL/min — ABNORMAL LOW (ref >60)
GFR calc Af Amer: 30 mL/min — ABNORMAL LOW (ref >60)
GFR calc non Af Amer: 30 mL/min — ABNORMAL LOW (ref >60)
Glucose, Bld: 94 mg/dL (ref 70–99)
Glucose, Bld: 99 mg/dL (ref 70–99)
Potassium: 3.4 mEq/L — ABNORMAL LOW (ref 3.5–5.1)
Sodium: 134 mEq/L — ABNORMAL LOW (ref 135–145)
Sodium: 137 mEq/L (ref 135–145)

## 2010-09-10 LAB — IRON AND TIBC
Iron: 36 ug/dL — ABNORMAL LOW (ref 42–135)
Saturation Ratios: 12 % — ABNORMAL LOW (ref 20–55)
TIBC: 311 ug/dL (ref 250–470)
UIBC: 275 ug/dL

## 2010-09-10 LAB — CBC
HCT: 31.4 % — ABNORMAL LOW (ref 36.0–46.0)
HCT: 32.2 % — ABNORMAL LOW (ref 36.0–46.0)
Hemoglobin: 10.6 g/dL — ABNORMAL LOW (ref 12.0–15.0)
Hemoglobin: 11.2 g/dL — ABNORMAL LOW (ref 12.0–15.0)
MCH: 30.4 pg (ref 26.0–34.0)
MCH: 30.5 pg (ref 26.0–34.0)
MCV: 91.3 fL (ref 78.0–100.0)
MCV: 92 fL (ref 78.0–100.0)
MCV: 92.6 fL (ref 78.0–100.0)
Platelets: 208 10*3/uL (ref 150–400)
Platelets: 225 10*3/uL (ref 150–400)
RBC: 3.44 MIL/uL — ABNORMAL LOW (ref 3.87–5.11)
RBC: 3.68 MIL/uL — ABNORMAL LOW (ref 3.87–5.11)
RDW: 14.9 % (ref 11.5–15.5)
RDW: 15 % (ref 11.5–15.5)
RDW: 15.1 % (ref 11.5–15.5)
WBC: 4.9 10*3/uL (ref 4.0–10.5)
WBC: 6 10*3/uL (ref 4.0–10.5)

## 2010-09-10 LAB — DIFFERENTIAL
Basophils Absolute: 0 10*3/uL (ref 0.0–0.1)
Basophils Absolute: 0 10*3/uL (ref 0.0–0.1)
Basophils Relative: 0 % (ref 0–1)
Eosinophils Absolute: 0.1 10*3/uL (ref 0.0–0.7)
Eosinophils Absolute: 0.2 10*3/uL (ref 0.0–0.7)
Eosinophils Relative: 0 % (ref 0–5)
Eosinophils Relative: 3 % (ref 0–5)
Eosinophils Relative: 3 % (ref 0–5)
Lymphocytes Relative: 27 % (ref 12–46)
Lymphocytes Relative: 37 % (ref 12–46)
Lymphs Abs: 1.6 10*3/uL (ref 0.7–4.0)
Lymphs Abs: 2 10*3/uL (ref 0.7–4.0)
Lymphs Abs: 2.2 10*3/uL (ref 0.7–4.0)
Monocytes Absolute: 0.8 10*3/uL (ref 0.1–1.0)
Monocytes Relative: 13 % — ABNORMAL HIGH (ref 3–12)
Monocytes Relative: 9 % (ref 3–12)
Neutro Abs: 2.7 10*3/uL (ref 1.7–7.7)
Neutrophils Relative %: 55 % (ref 43–77)

## 2010-09-10 LAB — CARDIAC PANEL(CRET KIN+CKTOT+MB+TROPI)
CK, MB: 6.1 ng/mL (ref 0.3–4.0)
Relative Index: 2.9 — ABNORMAL HIGH (ref 0.0–2.5)
Total CK: 213 U/L — ABNORMAL HIGH (ref 7–177)
Total CK: 243 U/L — ABNORMAL HIGH (ref 7–177)
Troponin I: 0.03 ng/mL (ref 0.00–0.06)

## 2010-09-10 LAB — LIPID PANEL
Cholesterol: 124 mg/dL (ref 0–200)
LDL Cholesterol: 48 mg/dL (ref 0–99)
Total CHOL/HDL Ratio: 2.2 RATIO
Triglycerides: 99 mg/dL (ref <150)
VLDL: 20 mg/dL (ref 0–40)

## 2010-09-10 LAB — POCT CARDIAC MARKERS
Troponin i, poc: <0.05 ng/mL (ref 0.00–0.09)
Troponin i, poc: <0.05 ng/mL (ref 0.00–0.09)

## 2010-09-10 LAB — BRAIN NATRIURETIC PEPTIDE: Pro B Natriuretic peptide (BNP): 221 pg/mL — ABNORMAL HIGH (ref 0.0–100.0)

## 2010-09-14 LAB — BASIC METABOLIC PANEL
BUN: 21 mg/dL (ref 6–23)
BUN: 22 mg/dL (ref 6–23)
BUN: 23 mg/dL (ref 6–23)
CO2: 28 mEq/L (ref 19–32)
CO2: 31 mEq/L (ref 19–32)
CO2: 32 mEq/L (ref 19–32)
CO2: 33 mEq/L — ABNORMAL HIGH (ref 19–32)
CO2: 33 mEq/L — ABNORMAL HIGH (ref 19–32)
CO2: 34 mEq/L — ABNORMAL HIGH (ref 19–32)
Calcium: 8.5 mg/dL (ref 8.4–10.5)
Calcium: 9.2 mg/dL (ref 8.4–10.5)
Calcium: 8.7 mg/dL (ref 8.4–10.5)
Calcium: 8.9 mg/dL (ref 8.4–10.5)
Chloride: 102 mEq/L (ref 96–112)
Chloride: 97 mEq/L (ref 96–112)
Creatinine, Ser: 1.6 mg/dL — ABNORMAL HIGH (ref 0.4–1.2)
Creatinine, Ser: 1.62 mg/dL — ABNORMAL HIGH (ref 0.4–1.2)
Creatinine, Ser: 1.87 mg/dL — ABNORMAL HIGH (ref 0.4–1.2)
Creatinine, Ser: 1.94 mg/dL — ABNORMAL HIGH (ref 0.4–1.2)
Creatinine, Ser: 1.72 mg/dL — ABNORMAL HIGH (ref 0.4–1.2)
Creatinine, Ser: 1.74 mg/dL — ABNORMAL HIGH (ref 0.4–1.2)
Creatinine, Ser: 1.76 mg/dL — ABNORMAL HIGH (ref 0.4–1.2)
GFR calc Af Amer: 38 mL/min — ABNORMAL LOW (ref >60)
GFR calc Af Amer: 34 mL/min — ABNORMAL LOW (ref >60)
GFR calc Af Amer: 35 mL/min — ABNORMAL LOW (ref >60)
GFR calc non Af Amer: 25 mL/min — ABNORMAL LOW (ref >60)
GFR calc non Af Amer: 26 mL/min — ABNORMAL LOW (ref >60)
GFR calc non Af Amer: 30 mL/min — ABNORMAL LOW (ref >60)
GFR calc non Af Amer: 31 mL/min — ABNORMAL LOW (ref >60)
GFR calc non Af Amer: 29 mL/min — ABNORMAL LOW (ref >60)
Glucose, Bld: 91 mg/dL (ref 70–99)
Glucose, Bld: 91 mg/dL (ref 70–99)
Glucose, Bld: 93 mg/dL (ref 70–99)
Glucose, Bld: 107 mg/dL — ABNORMAL HIGH (ref 70–99)
Glucose, Bld: 90 mg/dL (ref 70–99)
Potassium: 4.8 mEq/L (ref 3.5–5.1)
Potassium: 3.7 mEq/L (ref 3.5–5.1)
Potassium: 4 mEq/L (ref 3.5–5.1)
Sodium: 134 mEq/L — ABNORMAL LOW (ref 135–145)
Sodium: 138 mEq/L (ref 135–145)
Sodium: 139 mEq/L (ref 135–145)
Sodium: 136 mEq/L (ref 135–145)
Sodium: 138 mEq/L (ref 135–145)

## 2010-09-14 LAB — HEPATIC FUNCTION PANEL
AST: 43 U/L — ABNORMAL HIGH (ref 0–37)
Albumin: 4 g/dL (ref 3.5–5.2)
Alkaline Phosphatase: 39 U/L (ref 39–117)
Total Bilirubin: 0.7 mg/dL (ref 0.3–1.2)
Total Protein: 7.4 g/dL (ref 6.0–8.3)

## 2010-09-14 LAB — LIPID PANEL
LDL Cholesterol: 236 mg/dL — ABNORMAL HIGH (ref 0–99)
VLDL: 22 mg/dL (ref 0–40)

## 2010-09-14 LAB — URINALYSIS, ROUTINE W REFLEX MICROSCOPIC
Hgb urine dipstick: NEGATIVE
Ketones, ur: NEGATIVE mg/dL
Protein, ur: NEGATIVE mg/dL
Urobilinogen, UA: 0.2 mg/dL (ref 0.0–1.0)

## 2010-09-14 LAB — CBC
HCT: 27.2 % — ABNORMAL LOW (ref 36.0–46.0)
HCT: 35.5 % — ABNORMAL LOW (ref 36.0–46.0)
HCT: 35.1 % — ABNORMAL LOW (ref 36.0–46.0)
HCT: 36.4 % (ref 36.0–46.0)
HCT: 36.5 % (ref 36.0–46.0)
Hemoglobin: 10.3 g/dL — ABNORMAL LOW (ref 12.0–15.0)
Hemoglobin: 10.9 g/dL — ABNORMAL LOW (ref 12.0–15.0)
Hemoglobin: 11.7 g/dL — ABNORMAL LOW (ref 12.0–15.0)
Hemoglobin: 9.2 g/dL — ABNORMAL LOW (ref 12.0–15.0)
Hemoglobin: 12.1 g/dL (ref 12.0–15.0)
Hemoglobin: 12.2 g/dL (ref 12.0–15.0)
Hemoglobin: 12.4 g/dL (ref 12.0–15.0)
MCHC: 33.6 g/dL (ref 30.0–36.0)
MCHC: 33.8 g/dL (ref 30.0–36.0)
MCHC: 33.5 g/dL (ref 30.0–36.0)
MCHC: 33.9 g/dL (ref 30.0–36.0)
MCV: 96.4 fL (ref 78.0–100.0)
MCV: 96.8 fL (ref 78.0–100.0)
MCV: 96.9 fL (ref 78.0–100.0)
MCV: 94.9 fL (ref 78.0–100.0)
MCV: 95.5 fL (ref 78.0–100.0)
Platelets: 177 10*3/uL (ref 150–400)
Platelets: 193 10*3/uL (ref 150–400)
RBC: 3.2 MIL/uL — ABNORMAL LOW (ref 3.87–5.11)
RBC: 3.28 MIL/uL — ABNORMAL LOW (ref 3.87–5.11)
RBC: 3.64 MIL/uL — ABNORMAL LOW (ref 3.87–5.11)
RBC: 3.82 MIL/uL — ABNORMAL LOW (ref 3.87–5.11)
RDW: 14.7 % (ref 11.5–15.5)
RDW: 14.8 % (ref 11.5–15.5)
RDW: 15.3 % (ref 11.5–15.5)
RDW: 14.6 % (ref 11.5–15.5)
RDW: 14.8 % (ref 11.5–15.5)
WBC: 5.1 10*3/uL (ref 4.0–10.5)
WBC: 5.9 10*3/uL (ref 4.0–10.5)

## 2010-09-14 LAB — CARDIAC PANEL(CRET KIN+CKTOT+MB+TROPI)
CK, MB: 5.1 ng/mL — ABNORMAL HIGH (ref 0.3–4.0)
CK, MB: 6.1 ng/mL (ref 0.3–4.0)
CK, MB: 6.3 ng/mL (ref 0.3–4.0)
CK, MB: 6.5 ng/mL (ref 0.3–4.0)
Relative Index: 2.2 (ref 0.0–2.5)
Relative Index: 2.3 (ref 0.0–2.5)
Relative Index: 3.1 — ABNORMAL HIGH (ref 0.0–2.5)
Total CK: 234 U/L — ABNORMAL HIGH (ref 7–177)
Total CK: 321 U/L — ABNORMAL HIGH (ref 7–177)
Total CK: 340 U/L — ABNORMAL HIGH (ref 7–177)
Total CK: 444 U/L — ABNORMAL HIGH (ref 7–177)
Total CK: 344 U/L — ABNORMAL HIGH (ref 7–177)
Troponin I: 3.19 ng/mL (ref 0.00–0.06)
Troponin I: 4.57 ng/mL (ref 0.00–0.06)
Troponin I: 6.58 ng/mL (ref 0.00–0.06)
Troponin I: 2.22 ng/mL (ref 0.00–0.06)
Troponin I: 2.95 ng/mL (ref 0.00–0.06)

## 2010-09-14 LAB — HEPARIN LEVEL (UNFRACTIONATED)
Heparin Unfractionated: 0.48 IU/mL (ref 0.30–0.70)
Heparin Unfractionated: 0.53 IU/mL (ref 0.30–0.70)
Heparin Unfractionated: 0.57 IU/mL (ref 0.30–0.70)
Heparin Unfractionated: 0.63 IU/mL (ref 0.30–0.70)

## 2010-09-14 LAB — DIFFERENTIAL
Basophils Absolute: 0 10*3/uL (ref 0.0–0.1)
Basophils Relative: 1 % (ref 0–1)
Eosinophils Absolute: 0.1 10*3/uL (ref 0.0–0.7)
Eosinophils Relative: 3 % (ref 0–5)
Lymphocytes Relative: 36 % (ref 12–46)
Lymphs Abs: 2 10*3/uL (ref 0.7–4.0)
Monocytes Absolute: 0.4 10*3/uL (ref 0.1–1.0)
Monocytes Absolute: 0.4 10*3/uL (ref 0.1–1.0)
Monocytes Relative: 7 % (ref 3–12)
Monocytes Relative: 8 % (ref 3–12)
Neutro Abs: 2.5 10*3/uL (ref 1.7–7.7)

## 2010-09-14 LAB — POCT CARDIAC MARKERS
CKMB, poc: 4 ng/mL (ref 1.0–8.0)
Myoglobin, poc: 281 ng/mL (ref 12–200)

## 2010-09-14 LAB — TSH
TSH: 5.533 u[IU]/mL — ABNORMAL HIGH (ref 0.350–4.500)
TSH: 5.959 u[IU]/mL — ABNORMAL HIGH (ref 0.350–4.500)

## 2010-09-14 LAB — HEMOGLOBIN A1C: Mean Plasma Glucose: 111 mg/dL (ref <117)

## 2010-09-14 LAB — T3: T3, Total: 38.5 ng/dl — ABNORMAL LOW (ref 80.0–204.0)

## 2010-09-14 LAB — APTT: aPTT: 33 seconds (ref 24–37)

## 2010-09-14 LAB — PROTIME-INR
INR: 1.01 (ref 0.00–1.49)
Prothrombin Time: 13.2 seconds (ref 11.6–15.2)

## 2010-10-04 ENCOUNTER — Emergency Department (HOSPITAL_COMMUNITY): Payer: Medicare Other

## 2010-10-04 ENCOUNTER — Observation Stay (HOSPITAL_COMMUNITY)
Admission: EM | Admit: 2010-10-04 | Discharge: 2010-10-05 | Disposition: A | Discharge status: Home / Self Care | Payer: Medicare Other | Attending: Internal Medicine | Admitting: Internal Medicine

## 2010-10-04 DIAGNOSIS — E876 Hypokalemia: Secondary | ICD-10-CM | POA: Insufficient documentation

## 2010-10-04 DIAGNOSIS — J449 Chronic obstructive pulmonary disease, unspecified: Secondary | ICD-10-CM | POA: Insufficient documentation

## 2010-10-04 DIAGNOSIS — I10 Essential (primary) hypertension: Secondary | ICD-10-CM | POA: Insufficient documentation

## 2010-10-04 DIAGNOSIS — I509 Heart failure, unspecified: Secondary | ICD-10-CM | POA: Insufficient documentation

## 2010-10-04 DIAGNOSIS — E039 Hypothyroidism, unspecified: Secondary | ICD-10-CM | POA: Insufficient documentation

## 2010-10-04 DIAGNOSIS — R0789 Other chest pain: Secondary | ICD-10-CM | POA: Insufficient documentation

## 2010-10-04 DIAGNOSIS — Z79899 Other long term (current) drug therapy: Secondary | ICD-10-CM | POA: Insufficient documentation

## 2010-10-04 DIAGNOSIS — I5032 Chronic diastolic (congestive) heart failure: Secondary | ICD-10-CM | POA: Insufficient documentation

## 2010-10-04 DIAGNOSIS — J4489 Other specified chronic obstructive pulmonary disease: Secondary | ICD-10-CM | POA: Insufficient documentation

## 2010-10-04 DIAGNOSIS — K219 Gastro-esophageal reflux disease without esophagitis: Principal | ICD-10-CM | POA: Insufficient documentation

## 2010-10-04 LAB — BASIC METABOLIC PANEL
CO2: 32 mEq/L (ref 19–32)
Calcium: 9.4 mg/dL (ref 8.4–10.5)
Chloride: 92 mEq/L — ABNORMAL LOW (ref 96–112)
Creatinine, Ser: 2.5 mg/dL — ABNORMAL HIGH (ref 0.4–1.2)
GFR calc Af Amer: 23 mL/min — ABNORMAL LOW (ref >60)
Sodium: 136 mEq/L (ref 135–145)

## 2010-10-04 LAB — CBC
HCT: 35.4 % — ABNORMAL LOW (ref 36.0–46.0)
Hemoglobin: 11.3 g/dL — ABNORMAL LOW (ref 12.0–15.0)
MCH: 28.8 pg (ref 26.0–34.0)
MCHC: 31.9 g/dL (ref 30.0–36.0)
RBC: 3.93 MIL/uL (ref 3.87–5.11)

## 2010-10-04 LAB — HEPATIC FUNCTION PANEL
ALT: 12 U/L (ref 0–35)
AST: 29 U/L (ref 0–37)
Albumin: 3.6 g/dL (ref 3.5–5.2)
Alkaline Phosphatase: 37 U/L — ABNORMAL LOW (ref 39–117)
Total Bilirubin: 0.5 mg/dL (ref 0.3–1.2)
Total Protein: 7.2 g/dL (ref 6.0–8.3)

## 2010-10-04 LAB — POCT CARDIAC MARKERS
Myoglobin, poc: 193 ng/mL (ref 12–200)
Troponin i, poc: <0.05 ng/mL (ref 0.00–0.09)

## 2010-10-04 LAB — DIFFERENTIAL
Basophils Relative: 0 % (ref 0–1)
Lymphocytes Relative: 32 % (ref 12–46)
Monocytes Absolute: 0.6 10*3/uL (ref 0.1–1.0)
Monocytes Relative: 10 % (ref 3–12)
Neutro Abs: 3.5 10*3/uL (ref 1.7–7.7)
Neutrophils Relative %: 55 % (ref 43–77)

## 2010-10-04 LAB — TSH: TSH: 2.926 u[IU]/mL (ref 0.350–4.500)

## 2010-10-04 LAB — CARDIAC PANEL(CRET KIN+CKTOT+MB+TROPI)
CK, MB: 2.2 ng/mL (ref 0.3–4.0)
Relative Index: INVALID (ref 0.0–2.5)
Total CK: 78 U/L (ref 7–177)
Troponin I: 0.03 ng/mL (ref 0.00–0.06)

## 2010-10-04 LAB — CK TOTAL AND CKMB (NOT AT ARMC)
CK, MB: 2.5 ng/mL (ref 0.3–4.0)
Relative Index: INVALID (ref 0.0–2.5)
Total CK: 96 U/L (ref 7–177)

## 2010-10-05 LAB — DIFFERENTIAL
Basophils Absolute: 0 10*3/uL (ref 0.0–0.1)
Basophils Relative: 0 % (ref 0–1)
Eosinophils Absolute: 0.2 10*3/uL (ref 0.0–0.7)
Eosinophils Relative: 4 % (ref 0–5)
Lymphs Abs: 2.1 10*3/uL (ref 0.7–4.0)
Neutrophils Relative %: 52 % (ref 43–77)

## 2010-10-05 LAB — CBC
MCV: 93.5 fL (ref 78.0–100.0)
Platelets: 182 10*3/uL (ref 150–400)
RDW: 15.2 % (ref 11.5–15.5)
WBC: 6.1 10*3/uL (ref 4.0–10.5)

## 2010-10-05 LAB — BASIC METABOLIC PANEL
BUN: 24 mg/dL — ABNORMAL HIGH (ref 6–23)
Chloride: 101 mEq/L (ref 96–112)
Creatinine, Ser: 2.11 mg/dL — ABNORMAL HIGH (ref 0.4–1.2)
Glucose, Bld: 89 mg/dL (ref 70–99)
Potassium: 4.6 mEq/L (ref 3.5–5.1)

## 2010-10-05 LAB — LIPID PANEL
HDL: 53 mg/dL (ref >39)
LDL Cholesterol: 55 mg/dL (ref 0–99)
Triglycerides: 81 mg/dL (ref <150)

## 2010-10-05 LAB — CARDIAC PANEL(CRET KIN+CKTOT+MB+TROPI)
CK, MB: 1.6 ng/mL (ref 0.3–4.0)
Total CK: 68 U/L (ref 7–177)

## 2010-10-05 NOTE — H&P (Signed)
NAME:  Brandy Mcguire, Brandy Mcguire             ACCOUNT NO.:  [id number]  MEDICAL RECORD NO.:  [id number]           PATIENT TYPE:  O  LOCATION:  A339                          FACILITY:  [facility]  PHYSICIAN:  Rosanna Randy, MDDATE OF BIRTH:  11/05/1933  DATE OF ADMISSION:  10/04/2010 DATE OF DISCHARGE:  LH                             HISTORY & PHYSICAL   PRIMARY CARE PHYSICIAN:  Corrie Mckusick, MD  PRIMARY CARDIOLOGIST:  Nanetta Batty, MD  CHIEF COMPLAINT:  Chest pain.  The patient is going to be admitted under [facility].  HISTORY OF PRESENT ILLNESS:  A 77 year old female with a past medical history significant for coronary artery disease, hypertension, hyperlipidemia, COPD on chronic oxygen, diastolic CHF, and chronic kidney disease stage III, who comes to the emergency department of [facility] secondary to chest pain.  The patient reports that she was doing okay and after she ate her early breakfast, she started experiencing mid epigastric discomfort that radiated up to her mid chest associated with nausea and small vomiting.  Since the patient continues experiencing chest pain after vomiting, she took nitroglycerin tablet without much relief.  In fact, the pain becomes sharper, that was the reason why she called 911.  After further doses of nitroglycerin and morphine given by EMS and also by the emergency department physician, the patient's pain went away.  By the time that I saw the patient, her pain was completely gone and she was lying on bed comfortable without any acute distress.  Due to the patient's history of coronary artery disease, [facility] was called to admit the patient for observation and rule out acute coronary syndrome.  The patient denies any fever, any chills, any palpitations, or any cough.  She also denies any long trips and endorses just mild rhinorrhea and nasal congestion. She reports not been missing any of her  medications and denies any swelling of her legs.  While in the emergency department, the patient had EKG that demonstrated no new changes in comparison to her old one, still showing right bundle-branch block with a left anterior bifascicular block and nonspecific ST changes in her lateral leads.  The patient also had cardiac markers x1, which were negative and x-ray that demonstrated no acute cardiopulmonary process.  ALLERGIES:  No known drug allergies.  PAST MEDICAL HISTORY: 1. Significant for coronary artery disease with a history of non-ST     segment elevation myocardial infarction and right coronary artery     bare-metal stenting x2 in June 2011 by Dr. Allyson Sabal. 2. Chronic diastolic congestive heart failure.  Last 2-D echo done in     January 2012, ejection fraction 60-65%.  The patient with COPD with     emphysema oxygen dependent on 2 liters chronically. 3. Pulmonary hypertension. 4. Hypertension. 5. Hypothyroidism. 6. Stage III chronic kidney disease with a baseline creatinine from     1.9 to 2.5 after looking at her laboratory results of her multiple     admissions. 7. History of renal artery stenosis and status post stent in December     2001. 8. Thoracic aortic aneurysm,  which is being followed by Dr. Laneta Simmers. 9. Stable pulmonary nodules. 10.Iron-deficiency anemia. 11.History of previous stroke.  MEDICATIONS:  Med rec was still pending at the moment of this dictation, but talking with the patient and looking previous discharge summary from February 2012, home medications include, 1. Symbicort 2 puffs inhaled b.i.d. 2. Diltiazem 120 mg ER by mouth daily. 3. Iron complex 150 mg 1 tablet by mouth twice a day. 4. Lasix 40 mg 1 tablet by mouth daily. 5. Metoprolol 12.5 mg b.i.d. 6. MiraLax 17 grams by mouth daily. 7. Acetaminophen 325 mg 2 tablets by mouth every 4 hours as needed for     pain. 8. Aspirin 325 mg enteric-coated aspirin by mouth daily. 9. Plavix 75 mg 1  tablet by mouth daily. 10.Fish oil 1 tablet by mouth daily. 11.Imdur 30 mg 1 tablet by mouth daily. 12.Synthroid 25 mcg 1 tablet by mouth daily. 13.Multivitamin 1 tablet by mouth daily. 14.Nitroglycerin sublingual 0.4 mg 1 tablet q.5 minutes as needed for     chest pain up to three doses. 15.Protonix 40 mg 1 tablet by mouth daily. 16.Crestor 20 mg 1 tablet by mouth daily.  SOCIAL HISTORY:  The patient is widowed.  [place] son, [place]on.  She is retired, quit smoking in 2002. Denies any alcohol or illicit drugs.  FAMILY HISTORY:  Significant for intestinal cancer in her mother and pulmonary embolism in her father, otherwise noncontributory.  REVIEW OF SYSTEMS:  As mentioned in history of present illness, otherwise unremarkable.  PHYSICAL EXAM:  VITAL SIGNS:  Temperature 97.7, heart rate 66, respiratory rate 18, blood pressure 160/111, oxygen saturation 94% on room air. GENERAL:  The patient was lying in bed, in no acute distress, able to speak in full sentences, breathing comfortable and chest pain-free. HEENT:  Head was normocephalic, atraumatic.  Dentures in place.  No erythema, exudates, or plaques inside her mouth.  Moist mucous membranes.  Eyes, PERLA.  Extraocular muscles intact.  No icterus.  No nystagmus. NECK:  No JVD.  Supple.  No thyromegaly.  No bruits. RESPIRATORY:  Clear to auscultation except for mild expiratory wheezing. HEART:  Regular rate and rhythm.  Positive systolic murmur. ABDOMEN:  Soft, nontender, nondistended.  Positive bowel sounds. EXTREMITIES:  Trace of edema bilaterally.  Good pulses. NEUROLOGIC:  The patient was alert, awake, and oriented x3.  Cranial nerves II through XII intact.  Strength was 5/5 throughout.  Sensation was intact.  PERTINENT LABORATORY DATA:  On admission, the patient had a sodium of 136, potassium 3.0, had a chloride of 92, bicarb 32, BUN 27, creatinine 2.50.  Blood sugar was 127.  White  blood cells 6.3, hemoglobin 11.3, platelets 205.  EKG with significant findings for right bundle-branch block, left anterior fascicular block, and nonspecific T-wave abnormalities in the lateral leads, unchanged for her previous EKG. Cardiac markers negative x1.  Chest x-ray without cardiopulmonary disease.  ASSESSMENT AND PLAN: 1. The patient's chest pain.  Per her description and the fact that     she endorses thinking that she overeat this morning, most likely     reflux in etiology, but due to the patient's significant history of     coronary artery disease, we are going to admit for observation,     serial EKG, telemetry.  We are going to cycle her cardiac enzymes     depending findings.  We will consult [facility] in the     morning.  The patient had  a recent 2-D echo in January, so we are     going to hold on that for now.  We are going to treat her blood     pressure and we are going to continue on her heart medications. 2. The patient's hypertension, elevated this morning since she had     missed all her meds.  We are going to restart the patient's home     medications and we are going to follow her blood pressure and the     rest of her vital signs. 3. The patient's hypokalemia.  We are going to check a magnesium level     and we are going to replete her potassium. 4. The patient's reflux.  We are going to increase to twice a day her     antacid medication and we are going to provide also p.r.n. Mylanta. 5. The patient's hypothyroidism.  We are going to check a TSH and we     are going to continue Synthroid. 6. The patient's hyperlipidemia.  We are going to check a fasting     lipid profile and continue statins. 7. The patient's chronic kidney disease, which looks to be stable and     at baseline.  We will continue monitor as in the trending of her     creatinine. 8. The patient's seasonal allergies.  We are going to start the     patient on loratadine 10 mg by  mouth daily. 9. The patient's chronic obstructive pulmonary disease.  We are going     to continue Symbicort and also oxygen. 10.The patient's congestive heart failure, diastolic with a 2-D echo     just done in January.  We are going to continue her daily Lasix.     Depending how the patient improves and further results, we will     decide any other workup that might be required throughout this     hospitalization.     Rosanna Randy, MD     CEM/MEDQ  D:  10/04/2010  T:  10/04/2010  Job:  [id number]  cc:   Corrie Mckusick, M.D. Fax: [phone]  Nanetta Batty, M.D. Fax: [phone]  Electronically Signed by Vassie Loll MD on 10/05/2010 04:07:42 PM

## 2010-10-06 NOTE — Discharge Summary (Signed)
Brandy Mcguire, Brandy Mcguire             ACCOUNT NO.:  [id number]  MEDICAL RECORD NO.:  [id number]           PATIENT TYPE:  O  LOCATION:  A339                          FACILITY:  [facility]  PHYSICIAN:  Rosanna Randy, MDDATE OF BIRTH:  05-26-1934  DATE OF ADMISSION:  10/04/2010 DATE OF DISCHARGE:  04/09/2012LH                              DISCHARGE SUMMARY   PRIMARY CARE PHYSICIAN:  Corrie Mckusick, MD  DISCHARGE DIAGNOSES: 1. Noncardiac chest pain secondary to exacerbation of the patient's     gastroesophageal reflux disease. 2. Hypertension. 3. Hypokalemia. 4. Gastroesophageal reflux disease. 5. Hypothyroidism. 6. Chronic obstructive pulmonary disease, on chronic oxygen. 7. Congestive heart failure, diastolic, last 2-D echo in January 2012,     ejection fraction 60-65%. 8. Anemia, combination of anemia of chronic disease and iron     deficiency. 9. Seasonal allergy. 10.Chronic kidney disease stage III/IV with a GFR of 23 and a     creatinine that fluctuates between 1.9 and 2.5. 11.Hyperlipidemia. 12.History of cerebrovascular accident, no residual deficit. 13.Pulmonary hypertension. 14.Thoracic aortic aneurysm followed by Dr. Laneta Simmers. 15.History of renal artery stenosis, status post stent in December     2001. 16.Stable pulmonary nodules.  DISCHARGE MEDICATIONS: 1. Loratadine 10 mg by mouth daily. 2. Protonix 40 mg, now increased to one tablet by mouth twice a day. 3. Aspirin 325 one tablet by mouth daily. 4. Bisacodyl 5 mg one tablet by mouth daily as needed for     constipation. 5. Symbicort two puffs inhaled twice a day. 6. Plavix 75 mg one tablet by mouth daily. 7. Diltiazem 120 mg capsule ER one tablet by mouth daily. 8. Ferrous sulfate 325 mg one tablet by mouth daily. 9. Fish oil one tablet by mouth daily. 10.Imdur 30 mg one tablet by mouth daily. 11.Lasix 40 mg one tablet by mouth daily. 12.Levothyroxine 25 mcg one tablet by mouth daily before  breakfast. 13.Metoprolol tartrate 25 mg, the patient instructed to the half     tablet twice a day. 14.Multivitamins one tablet by mouth daily. 15.Nitroglycerin one tablet sublingual 0.4 mg every 5 minutes as     needed up to three doses for chest pain. 16.Crestor 20 one tablet by mouth daily at bedtime.  DISPOSITION AND FOLLOWUP:  The patient has been discharged in stable and improved condition with instructions to arrange a hospital followup with primary care physician Dr. Phillips Odor in 7-10 days.  The patient had been advised to try not to overeat in fact it will be better for her to follow multiple small meals throughout the day to help with the reflux. The patient received information that her Protonix had been increased to twice a day for better control of her reflux and she is going to continue taking the rest of her medications as prescribed.  PROCEDURE PERFORMED DURING THIS HOSPITALIZATION:  The patient had a chest x-ray on October 04, 2010, that demonstrated no acute abnormalities. No other procedures were performed.  The patient have serial EKG demonstrating just biventricular right bundle-branch block and left anterior fascicular block, which are unchanged from previous EKG.  CONSULTATIONS:  No consultations were made.  HISTORY  OF PRESENT ILLNESS:  A 77 year old female with significant past medical history for coronary artery disease, hypertension, hyperlipidemia, and COPD on chronic oxygen who comes to the emergency department secondary to chest pain.  The patient reports that she was doing okay and after she ate her early breakfast, she was suddenly experienced a midepigastric discomfort that radiated up to her midchest associated with nausea and a small vomiting.  Since the patient continue experiencing chest pain after vomiting, she took nitroglycerin tablets without much relief in fact the pain becomes sharper and that was the main reason why she called 911.  After the  patient received further doses of nitroglycerin and also morphine given by EMS and the emergency department physician, the patient's pain went away.  By the time the patient was evaluated, her pain was completely gone.  She was lying in bed comfortable without any acute distress.  Due to the patient's history of coronary artery disease, [facility] was called to admit the patient for observation and further cycle of cardiac enzymes and serial EKG.  The patient denies any fever, chills, palpitations, or any cough.  Physical exam on admission demonstrated a temperature of 97.7, heart rate 66, respiratory rate 18, blood pressure was 160/111, oxygen saturation was 94% on room air.  PERTINENT LABORATORY DATA:  The patient had a sodium of 136, potassium 3.0, chloride 92, bicarb 32, BUN 27, creatinine 2.50, blood sugar was 127.  White blood cells 6.3, hemoglobin 11.3, platelets 205.  EKG without significant findings except for right bundle-branch block, left anterior fascicular block and nonspecific T-wave abnormalities in the lateral leads, which were all unchanged from her previous EKG.  Cardiac markers negative x1 prior to admission while she was in the ED.  Chest x- ray as already mentioned and procedures were performed during this admission demonstrated no cardiopulmonary disease.  HOSPITAL COURSE BY PROBLEM: 1. Chest pain, none associated to her heart, most likely secondary to     gastroesophageal reflux disease.  For further control of reflux,     the patient Protonix was increased to twice a day and the patient     was advised to follow multiple small meals throughout the day and     try not to light down immediately after eating.  The patient is     going to arrange a hospital followup with primary care physician     and at that time, it is going to be important to review that the     patient's symptoms have in fact not returned and that she continues     to  do okay. 2. Hypertension, initially elevated on admission since the patient due     to the nausea, vomiting and discomfort that was suspicious.  Did     not take any of her meds.  She was placed back on her medications     and her blood pressure maintained well controlled and is stable     throughout hospitalization.  Plan is to continue with the same home     regimen. 3. Hypokalemia most likely secondary to mild dehydration due to the     nausea and vomiting that she reported and also the fact that she is     using diuretics at home.  Potassium was repleted.  Magnesium was     checked.  At discharge, both electrolytes were back into normal     range. 4. Gastroesophageal reflux disease.  We are going to adjust her  Protonix to twice a day.  Recommendations for multiple small meals     were given. 5. Hypothyroidism.  Normal TSH.  Continue Synthroid. 6. Chronic obstructive pulmonary disease.  Plan is to continue     Symbicort and also home oxygen. 7. Diastolic congestive heart failure, chronic, stable.  BMP this     hospitalization 203, lower than her BMP during her admission in     January 2012, which was 237.  Currently no JVD and no significant     edema on her legs.  We are going to continue her daily doses of     Lasix.  We are going to continue also the treatment with her Imdur     and beta-blocker. 8. Anemia.  We are going to continue using her ferrous sulfate. 9. Seasonal allergy.  We are going to give a 30 days supply to     loratadine 10 mg by mouth daily. 10.Chronic kidney disease, stage III/IV with a GFR of 23 and a     creatinine baseline that fluctuates between 1.9 and 2.5.  The     patient is going to continue taking her medications and is going to     follow with her primary care physician, it is going to be important     do to her creatinine to decrease if founds appropriate by PCP her     Crestor to 10 mg by mouth daily instead of 20. 11.Hyperlipidemia.  We are  going to continue her statins per pharmacy     recommendations due to the patient's creatinine clearance, this is     something that might need to be decreased to 10 mg by mouth daily     instead of 20. 12.History of cerebrovascular accident.  We are going to continue     aspirin, statins, and Plavix. 13.Coronary artery disease, unchanged.  No signs of ischemia during     this hospitalization.  We are going to continue the patient's home     medications.  The rest of the patient's problems remained stable and unchanged throughout this hospitalization.  We are going to continue the same home meds.  At discharge, the patient's temperature 98.7, heart rate 58, respiratory rate 18, blood pressure 107/69, oxygen saturation 98% on 2 liters.  The patient was in no acute distress.  Denies any chest pain, shortness of breath, nausea, vomiting or abdominal pain.  Respiratory system was clear to auscultation.  Heart, regular rate and rhythm with a positive systolic ejection murmur.  Extremities, trace of edema. Abdomen, soft, nontender, and nondistended.  Positive bowel sounds. Neurologic exam was nonfocal.     Rosanna Randy, MD     CEM/MEDQ  D:  10/05/2010  T:  10/06/2010  Job:  [id number]  cc:   Corrie Mckusick, M.D. Fax: [phone]  Electronically Signed by Vassie Loll MD on 10/06/2010 02:42:18 PM

## 2010-11-10 NOTE — Assessment & Plan Note (Signed)
OFFICE VISIT   Brandy Mcguire, Brandy Mcguire  DOB:  April 14, 1934                                        June 23, 2010  CHART #:  [id number]   HISTORY:  The patient returned to my office today for followup of  ascending and descending thoracic aortic aneurysms as well as multiple  bilateral pulmonary nodules seen on previous CT scan.  Since I last saw  her on December 23, 2009, she said she did have one admission to [facility] for about 5 days for management of congestive heart failure  with lower extremity swelling.  She said this was several months ago and  since then she has been feeling fairly well with no recurrent swelling  in her legs.  She denies any chest pain or pressure.  She has mild  shortness of breath at times.  She has had no cough or hemoptysis.   PHYSICAL EXAMINATION:  Her blood pressure is 94/66, pulse 84 and  regular, and respiratory rate is 18 and unlabored.  Oxygen saturation on  room air is 92%.  She looks well.  Cardiac exam shows regular rate and  rhythm with a grade 2/6 systolic murmur heard throughout the precordium.  Lungs reveal slight rales in the left base.  There is good aeration  bilaterally.  There is no peripheral edema.  There is no cervical or  supraclavicular adenopathy.   DIAGNOSTIC TESTS:  A followup CT scan today shows the size of the  ascending thoracic aorta measured 4.4 x 4.6 cm, which is essentially  unchanged from the previous scan with a measurement was 4.6 x 4.7 cm.  The descending thoracic aorta today measures 3.3 cm which is unchanged.  The distal thoracic aorta just proximal to the diaphragm has a maximum  AP diameter of 4.9 cm which is unchanged.  The lung windows showed no  evidence of pulmonary edema or airspace consolidation.  The right upper  lobe subpleural calcified granuloma is stable.  There is a 1-2 mm  peripheral nodule in the posterior right upper lobe which is unchanged.  The 4-mm right middle  lobe pulmonary nodule is unchanged.  The 8-mm  pulmonary nodule in the right lung base is unchanged.  This had been  noted back in 2005 with some progression in size from 2005 to June 2011.  It is unchanged over the past 4-month interval.   IMPRESSION:  Overall, the patient remains stable.  She has stable  ascending and descending thoracic aortic aneurysms which are below the  threshold requiring surgery.  She has multiple pulmonary nodules that  are unchanged.  We will plan to see her back in 1 year with a repeat CT  scan of the chest to follow up on these lung nodules and the aortic  aneurysms.  She has followup appointment tomorrow with Dr. Allyson Sabal and  will continue to follow up with her primary physician, Dr. Phillips Odor.   Evelene Croon, M.D.  Electronically Signed   BB/MEDQ  D:  06/23/2010  T:  06/23/2010  Job:  [id number]   cc:   Nanetta Batty, M.D.  Corrie Mckusick, M.D.

## 2010-11-10 NOTE — Assessment & Plan Note (Signed)
OFFICE VISIT   Brandy Mcguire, Brandy Mcguire  DOB:  1934-06-02                                        December 23, 2009  CHART #:  [id number]   HISTORY:  The patient returned to my office today for followup.  She is  a 77 year old woman who I originally saw in consultation on November 26, 2009, for coronary artery disease status post non-ST segment elevation  MI as well as ascending and descending thoracic aneurysms.  She had a  culprit vessel that appeared to be a 99% proximal and mid right coronary  stenosis.  She had moderate 50% distal left main stenosis as well as 50-  60% proximal left circumflex stenosis.  We got a CT scan of the chest  which showed the size of her ascending aortic aneurysm to be less than 5  cm and the size of her descending aortic aneurysm was about 4.9 cm at  the level of the diaphragm.  Since these aneurysms are not large enough  to recommend surgery and she was a high-risk surgical patient to start  with, I felt the best to perform a percutaneous intervention on the  right coronary artery.  She did have 2 stents placed and did well with  this.  Her CT scan also showed multiple bilateral pulmonary nodules,  some of which were calcified and some not.  The only nodule which had  change in size was a right lower lobe pulmonary nodule, which was 4 mm  by previous CT scan in 2005, and now it increased to about 8 mm.  This  was noncalcified and Radiology recommended another scan in about 6  months.   Since I last saw her in the hospital, she said she has been feeling  relatively well, although she feels she has had some memory disturbance.  She denies any chest pain or shortness of breath.   PHYSICAL EXAMINATION:  Blood pressure 144/88, pulse is 74 and regular,  and respiratory rate is 18, unlabored.  Oxygen saturation on room air is  88%.  She looks well.  Cardiac exam shows regular rate and rhythm with  normal heart sounds.  Lung exam reveals crackles  in her lung bases  bilaterally.  There is no peripheral edema.   DIAGNOSTIC TESTS:  A followup chest x-ray today shows clear lung fields  and no pleural effusions.   IMPRESSION:  The patient has moderate residual coronary disease, has not  required intervention as well as ascending and descending thoracic  aortic aneurysms that are less than 5 cm and not required intervention  at this time.  She also has multiple stable bilateral pulmonary nodules  and one noncalcified nodule in the right lower lobe that has increased  in size up to 8 mm.  We will plan to see her back in about 6 months with  a repeat CT scan of the chest to evaluate this nodule.  Otherwise, she  will follow up with Dr. Nanetta Batty.   Evelene Croon, M.D.  Electronically Signed   BB/MEDQ  D:  12/23/2009  T:  12/24/2009  Job:  [id number]

## 2010-11-13 NOTE — Discharge Summary (Signed)
[place]. [facility]  Patient:    Brandy Mcguire, Brandy Mcguire                    MRN: [id number] Adm. Date:  [id number] Disc. Date: 07/15/00 Attending:  Herold Harms Dictator:   Dian Situ, P.A. CC:         Runell Gess, M.D.             Dr. Lauralee Evener, M.D.                           Discharge Summary  DISCHARGE DIAGNOSES: 1. Status post cerebrovascular accident. 2. Hypertension. 3. Renal artery stenosis. 4. Anemia of chronic disease. 5. History of tobacco abuse.  HISTORY OF PRESENT ILLNESS:  Brandy Mcguire is a 77 year old female with hypertension, renal artery stenosis, a 2-1/107-month-history of intermittent episodes of slurred speech, generalized weakness, right greater than left. She was admitted on June 28, 2000, with recurrent dysarthria and ataxia. An evaluation by Dr. Marlan Palau, and placed on heparin.  An MRI held, secondary to recent renal artery stent.  Carotid cerebral angiogram done showed multiple focal segmental areas of narrowing, question of athero, versus vasculitis.  The heparin was discontinued on June 30, 2000, and on July 03, 2000, as the patient was with increase in left-sided weakness, an CCG was done showing low density in the internal capsule, question normal versus ischemic changes.  Heparin was resumed, and Coumadin initiated.  The patient is moderate assist for bed mobility, minimal assist for transfers, with two total assist 75% for ambulating 15 feet with a rolling walker.  PAST MEDICAL HISTORY:  See the discharge diagnoses, plus history of deep vein thrombosis of the right lower extremity, bilateral renal artery stenosis, and a hysterectomy.  ALLERGIES:  No known drug allergies.  SOCIAL HISTORY:  The patient was living with family in [place] and was independent prior to admission.  She does not use any alcohol.  Smokes one pack per day.  HOSPITAL COURSE:  Ms.  Jakita Mcguire was admitted to rehabilitation on July 05, 2000, for inpatient therapy, to consist of PT and OT daily.  After admission the patient was started on treatment doses of Lovenox until the INR was therapeutic level.  The blood pressures were monitored on a b.i.d. basis, and were noted to be very controlled.  The patient was continued on puree honey-thick liquids for her dysphagia.  A chest x-ray done after admission showed no acute disease.  Initially the patient was noted to have dysarthria with difficulty carrying on a conversation, secondary to endurance level.  Her speech has improved greatly, and repeat MBS of 116, showed flash penetration with thin, and was within normal limits.  The patient was advanced to D-III liquids with intermittent supervision.  Labs checked on admission showed an H&H of 10.6 and 32.9, platelets 309. Sodium 139, potassium 4.3, chloride 103, CO2 of 29, BUN 9, creatinine 1.2, glucose 89.  A recheck on July 13, 2000, showed sodium 133, potassium 4.5, BUN 27, creatinine 2.0, glucose 79.  Hemoglobin 10.0, hematocrit 30.7, white count 4.5, platelets 319.  The patient was encouraged to push honey-thick liquids, expecting ________ to improve greatly as she has been advanced to thin liquids.  Follow-up lytes to be checked in the next two weeks at [facility].  The patients respiratory status has remained stable.  Initially the patient was on a nebulizer to help with better aeration, and currently MDIs are being used on a p.r.n. basis.  The patient has been very motivated.  She has done well in her therapies. Currently the patients endurance is diminished, but she has improved greatly. She shows good insight into safety awareness.  She is currently at modified independence for ________ care, requesting setups for upper body dressing. She is supervision for low body bathing and dressing, as well as toileting. In terms of speech therapy, the patients basic  comprehension expression is intact.  All other comprehension is intact; however, expression shows minimal decrease in intelligibility.  Dysarthria is improved with increase in intelligibility.  Currently the patient is modified independent for bed mobility, modified independent for transfers.  She is currently modified independent for ambulating short distances, requests supervision secondary to fatigue issues.  Further progressive PT and OT to continue after discharge.  DISPOSITION:  The patient is to be discharged to an [facility] on July 15, 2000.  DISCHARGE MEDICATIONS: 1. Toprol XL 25 mg p.o. q.d. 2. Catapres TTS, one patch, change weekly on Tuesdays. 3. Pepcid 20 mg one q.d. 4. Senokot S two p.o. q.h.s. 5. Albuterol MDI two puffs q.i.d. p.r.n. shortness of breath. 6. Os-Cal 500 +D two p.o. b.i.d. 7. Tylenol 650 mg p.o. q.4-6h. p.r.n. pain. 8. Coumadin  ACTIVITIES:  Intermittent supervision at wheelchair level.  DIET:  D-III, thin liquids with intermittent supervision.  No straws. To be upright 90 degrees.  SPECIAL INSTRUCTIONS:  Avoid aspirin and aspirin products, nonsteroidal anti-inflammatory drugs.  Drink plenty of fluids.  FOLLOWUP:  The patient is to follow up with Dr. Jodelle Green and with Dr. Runell Gess in the next couple of weeks for a recheck, including a check of electrolytes.  Follow up with Dr. Anne Hahn in one month.  Follow up with Dr. Rande Brunt. Collins in one month for a recheck. DD:  07/14/00 TD:  07/14/00 Job: [id number]

## 2010-11-13 NOTE — Discharge Summary (Signed)
[place]. [facility]  Patient:    Brandy Mcguire, Brandy Mcguire                    MRN: [id number] Adm. Date:  [id number] Disc. Date: 07/02/00 Attending:  Lesly Dukes CC:         [facility]   Discharge Summary  ADMISSION DIAGNOSES: 1. Episodic dysarthria with exacerbation prior to this admission, rule out    stroke. 2. History of hypertension. 3. Renal artery stenosis status post recent renal artery stent.  DISCHARGE DIAGNOSES: 1. Probable small deep white matter or brain stem stroke event with dysarthria    and mild left upper extremity weakness. 2. Hypertension. 3. Renal artery stenosis status post left renal artery stent.  PROCEDURE: 1. CT of the brain. 2. Cerebral angiogram.  COMPLICATIONS:  None.  HISTORY OF PRESENT ILLNESS:  Brandy Mcguire is a 77 year old right-handed white female born [birth year] with a history of hypertension, renal artery stenosis, and peripheral vascular disease.  Patient has been seen previously by Dr. Sandria Manly for episodes of slurred speech, generalized weakness and numbness in the right greater than left side of the body lasting an hour or two usually.  They have been occurring on and off on an average of once a week over the last two and a half months.  This patient comes to the emergency room for an evaluation of a similar episode that came on the night prior to admission that persisted throughout the day of admission.  This patient has had some difficulty with balance and had increased difficulty with ambulation. Patient has had no falls.  Patient went to the [facility] Emergency Room and was sent down to [facility] for further evaluation.  Patient has already had a recent carotid Doppler study that apparently was unremarkable.  Patient was admitted at this point for further evaluation.  PAST MEDICAL HISTORY: 1. History of episodes of dysarthria, rule out stroke. 2. History of  hypertension. 3. History of renal artery stenosis on the left with status post stent    placement on June 07, 2000. 4. History of hysterectomy three years ago. 5. History of tobacco abuse.  MEDICATIONS: 1. Aspirin one tablet q.d. 2. Plavix 75 mg q.d. 3. Lotrel 5/10 mg tablet one q.d. 4. Toprol XL 25 mg daily.  ALLERGIES:  No known drug allergies.  SOCIAL HISTORY:  Smokes a pack of cigarettes a day.  Does not drink alcohol. Please refer to history and physical dictation summary.  FAMILY HISTORY:  Please refer to history and physical dictation summary.  REVIEW OF SYSTEMS:  Please refer to history and physical dictation summary.  PHYSICAL EXAMINATION:  Please refer to history and physical dictation summary.  LABORATORIES:  White count 5.2, hemoglobin 10.4, hematocrit 32.8, MCV 94.7, platelets 326.  ANA is pending.  Complement level C3, C4 are normal.  CH50 is pending.  Rheumatoid factor negative.  Serum protein electrophoresis is pending.  Urinalysis reveals specific gravity 1.005, pH 7.0, otherwise unremarkable.  Sedimentation rate 64.  Coags on admission unremarkable. Cholesterol level 260, triglycerides 107, HDL 57, LDL 182.  TSH 4.2.  Chest x-ray shows no active disease.  EKG reveals normal sinus rhythm, nonspecific T-wave abnormalities, heart rate 75.  HOSPITAL COURSE:  Patient has done well during the course of hospitalization. Patient was admitted for further evaluation.  Patient is noted to have significant dysarthria.  Speech is difficult to understand.  Patient initially felt to have good strength  on all fours.  Patient was started on heparin therapy and was set up for cerebral angiogram.  This was performed.  Shows evidence of some intracranial atherosclerosis.  No severe blockage is seen. No evidence of vertebrobasilar insufficiency was seen.  Patient was noted to have some mild ataxia with slight weakness of the left arm.  Patient then was taken off of heparin,  placed back on aspirin and Plavix and has done about the same off heparin.  Patient has ongoing mild left upper extremity drift, severe dysarthria.  Has been seen by speech therapy in evaluation and was placed on dysphasia 1 diet.  Patient is to have honey-thick liquids.  Patient has been seen by physical therapy for gait.  Has been able to walk with minimal assistance.  Plans are to potentially discharge this patient to home on July 02, 2000 with follow-up with speech therapy and physical therapy as an outpatient.  Patient will be discharged on aspirin one tablet q.d. 325 mg, Plavix 75 mg q.d., Toprol XL 25 mg q.d., and Lotrel 5/10 tablet one q.d. Patient will follow up with [facility] within two to three weeks.  May consider brain stem ______ response test in the future. Patient will be set up for an MRI scan of the brain around that time as well. D:  07/01/00 TD:  07/01/00 Job: [id number]

## 2010-11-13 NOTE — H&P (Signed)
. [facility]  Patient:    Brandy Mcguire, Brandy Mcguire                    MRN: [id number] Adm. Date:  [id number] Disc. Date: [id number] Attending:  Berry, Jonathan [place] CC:         Drs. Fusco/Whitley  [facility] [address]   History and Physical  HISTORY OF PRESENT ILLNESS:  Brandy Mcguire is a 77 year old right-handed white female born [birth year] with a history of hypertension and renal artery stenosis and peripheral vascular disease.  The patient has been seen and followed by Dr. Nanetta Batty as she had a left renal artery stent placed on the 11th of December 2001.  The patient, however, has a two-and-one-half months history of slurred speech, generalized weakness, numbness right greater than left body that come and go usually lasting an hour or two with full resolution.  This patient was seen by Dr. Sandria Manly in the last week or two prior to this admission.  She appeared due to recent renal artery stent placement.  MI scan of the brain, and MI angiogram was delayed until early February 2002.  This patient has been placed on aspirin and Plavix but returns to the [facility] Emergency Room on the day of this admission for evaluation of recurrent episode of slurred dysarthric speech with generalized weakness, numbness right greater than left side excluding the face.  The patient had a headache last evening when the episode began, but the problem has persisted until today.  The patient has been able to ambulate but feels somewhat off balance.  The patient has not fallen.  No blackout episodes have been noted. The patient denies any double vision or loss of vision.  The patient again feels "weak all over".  The patient claims her headache is now gone.  A CT scan at [facility] Emergency Room was unremarkable, and a carotid Doppler study was done recently and was apparently unremarkable.  This patient is admitted for further  evaluation.  PAST MEDICAL HISTORY:  Significant for: 1. History of episodes of dysarthria as above, right greater than left    hemicentric changes over the last two-and-a-half months.  Rule out    vertebral basilar insufficiency. 2. Hypertension. 3. Renal artery stenosis status post left renal artery stent on the 11th of    December 2001. 4. History of hysterectomy 3 years ago. 5. History of tobacco abuse.  CURRENT MEDICATIONS:  Include aspirin one tablet a day, Plavix 75 mg a day, clonidine taper.  Patient is currently off this medication.  Patient is on Lotrel 5 mg tablet one a day, Toprol XL 25 mg daily.  ALLERGIES:  Patient again states no known allergies.  SOCIAL HISTORY:  Smokes a pack of cigarettes a day.  Does not drink alcohol. The patient is a widow, lives in the [place] area.  Lives with her daughter and son-in-law.  Has 5 children who are alive and well.  FAMILY HISTORY:  Mother died of cancer.  Father died with stroke.  Patient has one brother, one sister, both dying from cancer.  Patient has 5 other surviving sisters.  REVIEW OF SYSTEMS:  Noted for no fevers, chills.  The patient denies any regular headache but did have a headache last evening.  The patient has had some episodes of visual dimming or visual loss with episodes of hypertension. The patient denies any shortness of breath.  Has had some chest tightness today,  occasional cough.  Had some problems with constipation.  Occasionally feels off balance with gait.  PHYSICAL EXAMINATION:  VITAL SIGNS:  Blood pressure 121/67, heart rate 78, respiratory rate 18, temperature afebrile.  GENERAL:  This patient is a fairly well developed white female who is alert and cooperative at the time of examination.  HEENT:  Examination reveals atraumatic eyes.  Pupils are equal, round and reactive to light.  Discus appear to be flat bilaterally.  Ptosis is noted on the right eye, not on the left.  NECK:  Relatively  supple.  No carotid bruits noted.  CHEST:  Examination reveals occasional bibasilar rales.  No rhonchi noted.  CARDIOVASCULAR:  Examination reveals regular rate and rhythm, no obvious murmurs, rubs.  ABDOMINAL:  Examination reveals positive bowel sounds, no organomegaly or tenderness noted.  EXTREMITIES:  Reveal no significant edema.  NEUROLOGIC:  Examination reveals cranial nerves as above.  Facial symmetry appears to be present.  Patient has good sensation to face pinprick and soft touch bilaterally.  The patient has good strength of facial muscles and the muscles to head turn bilaterally.  Tongue is midline.  Excellent gag reflexes seen.  The patient has no evidence of aphagia but significantly dysarthric speech is noted.  The patient has good strength to direct testing on all fours.  Good symmetric motor and tone is noted throughout.  Sensory testing is intact to pinprick, soft touch, ______ throughout.  Patient has fair finger-to-nose and finger-toe-to-finger bilaterally.  Patient is not ambulating.  Deep tendon reflexes are relatively symmetric.  Toes are neutral bilaterally.  LABORATORY VALUES:  Notable for glucose of 99, BUN of 14, creatinine of 1.5, sodium 136, potassium 4.7, chloride of 99, CO2 of 25, calcium 8.9, total protein 7.4, albumin 4.2, globulin of 3.2, SGPT of 18, alkaline phosphatase of 52, total bilirubin 0.3.  White count of 5.6, hemoglobin of 11.2, hematocrit 33.1, MCV of 91.7, platelets of 323.  Coags unremarkable.  IMPRESSION: 1. Episodic dysarthria, generalized weakness, numbness.  Rule out vertebral    basilar insufficiency. 2. Hypertension. 3. Tobacco abuse. 4. Renal artery stenosis status post recent stenting procedure.  This patient may not be able to undergo an MI scan with a recent renal artery stent placed.  May need to contact Dr. Nanetta Batty concerning this.  The patient is on aspirin and Plavix and has continued to have episodes  of  significant dysarthria that is persistent since last evening.  The patient may be a candidate for heparinization at this point and may need to consider cerebral angiogram, but this patient does have a slightly elevated creatinine level.  PLAN: 1. Admission to [facility]. 2. IV heparin therapy. 3. IV fluid hydration. 4. Check creatinine level in a.m.  If this normalizes, may consider cerebral    angiogram.  Will follow clinical course while in house.  The patient will be seen by physical therapy for gait and speech therapy for swallowing. DD:  06/28/00 TD:  06/28/00 Job: [id number]

## 2010-11-13 NOTE — Discharge Summary (Signed)
[place]. [facility]  Patient:    Brandy Mcguire                    MRN: [id number] Adm. Date:  [id number] Disc. Date: 07/05/00 Attending:  Herold Harms CC:         [facility], [address] [place], [place]  Drs. Fusco/Whitley, Sidney Ace, [place]  [facility]   Discharge Summary  ADMISSION DIAGNOSES: 1. Onset of dysarthria, gait disturbance, rule out ______ insufficiency. 2. History of hypertension. 3. Tobacco abuse. 4. Renal artery stenosis, status post recent stenting procedure.  DISCHARGE DIAGNOSES: 1. Onset of dysarthria, left arm weakness, gait disorder, rule out    subcortical infarct versus small brainstem lacunar infarct. 2. Hypertension. 3. Renal artery stenosis with left renal artery stent procedure. 4. History of tobacco abuse.  PROCEDURES: 1. Cerebral angiogram. 2. CT of the brain. 3. Modified barium swallow.  COMPLICATIONS OF ABOVE PROCEDURES:  None.  HISTORY OF PRESENT ILLNESS:  Brandy Mcguire is a 77 year old right-handed white female born 10/30/1933, with a history of hypertension and renal artery stenosis on the left, peripheral vascular disease.  This patient presented to the emergency room with a 2-1/30-month history of intermittent episodes of slurred speech, generalized weakness, numbness on the right greater than left body that would come and go within an hour or two will full resolution.  The patient was seen recently by Dr. Sandria Manly, within the week prior to this admission, and was set up for an MRI of the brain, and an MR angiogram was to be done in early February 2002, due to recent renal artery stent procedure placement.  The patient has been placed on aspirin and Plavix but returned to the [facility] Emergency Room on the day of admission to [facility] with recurrent episode of slurred speech, generalized weakness that did not clear overnight.  The patient was  transferred to [facility] and was admitted for further evaluation.  The patient has had a recent carotid Doppler study that was unremarkable.  PAST MEDICAL HISTORY: 1. History of ______ dysarthria presenting TIA events. 2. History of hypertension. 3. Renal artery stenosis with left renal artery stent placement on June 07, 2000. 4. History of hysterectomy. 5. History of tobacco abuse.  MEDICATIONS PRIOR TO ADMISSION: 1. Aspirin 1 tablet a day. 2. Plavix 75 mg a day. 3. Clonidine taper.  The patient was off clonidine at the time of this    admission. 4. Lotrel 5/10 mg tablet 1 a day. 5. Toprol 25 mg XL tablet 1 a day.  ALLERGIES:  No known allergies.  TOBACCO/ALCOHOL:  Smokes a pack of cigarettes a day.  Does not drink alcohol.  Please refer to history and physical dictation summary for social history, family history, review of systems, physical examination.  LABORATORY DATA:  At this time were notable for white count of 5.6, hemoglobin of 10.2, hematocrit of 31.2, platelets of 290.  Sedimentation rate was 64. Sodium 140, potassium 4.1, chloride of 105, CO2 28, glucose of 90, BUN of 12, creatinine 1.2, calcium 8.9, total protein 7.0, albumin 3.7, AST 22, ALT of 12, ALP of 36, total bilirubin 0.6.  Serum protein electrophoresis showed a nonspecific pattern associated with chronic inflammatory response.  TSH was 4.2.  The complement levels were normal.  Rheumatoid factor less than 20, ANA negative.  ANCA is pending.  Chest x-ray shows no active disease.  CT scan of  the head shows no definite abnormality.  Questionable subtle low-density area in the genu of the internal capsule bilaterally was seen.  EKG reveals normal sinus rhythm, nonspecific T-wave abnormalities.  Heart rate is 75.  HOSPITAL COURSE:  The patient was admitted to [facility] on June 28, 2000.  The patient was placed on heparin therapy.  MRI scan could not be performed due to recent renal  stent placement.  This patient had been set up for a cerebral angiogram, therefore, which was performed.  This shows areas of focal segmental narrowing in the pericallosal arteries and the closer marginal arteries.  It was felt that these findings were nonspecific and may represent atherosclerosis or possibly a vasculitic process.  The basilar artery was patent.  Left vertebral artery was dominant, and no discrete posteroinferior cerebellar artery was identified.  The posterior cerebellar arteries, superior cerebellar arteries were normal.  The left anteroinferior cerebellar artery is normal.  Left posteroinferior cerebellar artery was not well visualized.  The right anteroinferior cerebellar artery was normal.  The patient was placed back on aspirin and Plavix once the angiogram was done.  The patient continued to progress slowly on or off heparin.  The patient eventually was converted to Coumadin therapy, which was ongoing at the time of discharge.  The patient has developed more prominent left arm weakness with 4/5 strength in the left arm, severe dysarthria.  The patient was seen by speech therapy.  Underwent modified barium swallow, and the patient was converted to a dysphagia I diet with honey-thick liquids.  The patient seems to do well with this.  The patient has remained afebrile during the hospitalization.  Due to the question of vasculitis a sedimentation rate was obtained, which was elevated at 65-70 range.  ANA rheumatoid factor was obtained and unremarkable.  ANCA levels pending.  The patient has been seen by physical and occupational therapy, has been ambulating short distances with therapists, has significant balance issues.  Rehabilitation consultation was obtained.  The patient was seen by rehabilitation consult group and was eventually transferred to the rehabilitation service for acute inpatient rehabilitation.  The patient was discharged on July 05, 2000.   DISCHARGE MEDICATIONS: 1. Coumadin per pharmacy protocol. 2. The patient was on heparin, but this was changed to Lovenox. 3. Toprol XL 25 mg tablet 1 a day. 4. Albuterol every four to six hours if needed as an inhaler. 5. Lotrel 5/10 mg tablet. 6. The patient is off aspirin and Plavix at this time.  DISCHARGE INSTRUCTIONS:  The patient will continue to be followed by speech therapy on rehabilitation. DD:  07/06/00 TD:  07/06/00 Job: [id number]

## 2011-03-14 ENCOUNTER — Emergency Department (HOSPITAL_COMMUNITY)
Admission: EM | Admit: 2011-03-14 | Discharge: 2011-03-15 | Disposition: A | Discharge status: Home / Self Care | Payer: Medicare Other | Attending: Emergency Medicine | Admitting: Emergency Medicine

## 2011-03-14 ENCOUNTER — Encounter: Payer: Self-pay | Admitting: *Deleted

## 2011-03-14 DIAGNOSIS — IMO0001 Reserved for inherently not codable concepts without codable children: Secondary | ICD-10-CM | POA: Insufficient documentation

## 2011-03-14 DIAGNOSIS — S51809A Unspecified open wound of unspecified forearm, initial encounter: Secondary | ICD-10-CM | POA: Insufficient documentation

## 2011-03-14 DIAGNOSIS — I10 Essential (primary) hypertension: Secondary | ICD-10-CM | POA: Insufficient documentation

## 2011-03-14 DIAGNOSIS — Y92009 Unspecified place in unspecified non-institutional (private) residence as the place of occurrence of the external cause: Secondary | ICD-10-CM | POA: Insufficient documentation

## 2011-03-14 DIAGNOSIS — Z79899 Other long term (current) drug therapy: Secondary | ICD-10-CM | POA: Insufficient documentation

## 2011-03-14 DIAGNOSIS — IMO0002 Reserved for concepts with insufficient information to code with codable children: Secondary | ICD-10-CM

## 2011-03-14 DIAGNOSIS — Z7982 Long term (current) use of aspirin: Secondary | ICD-10-CM | POA: Insufficient documentation

## 2011-03-14 HISTORY — DX: Cerebral infarction, unspecified: I63.9

## 2011-03-14 HISTORY — DX: Essential (primary) hypertension: I10

## 2011-03-14 MED ORDER — RABIES IMMUNE GLOBULIN 150 UNIT/ML IM INJ
20.0000 [IU]/kg | INJECTION | Freq: Once | INTRAMUSCULAR | Status: AC
  Administered 2011-03-14: 1650 [IU] via INTRAMUSCULAR
  Filled 2011-03-14: qty 11

## 2011-03-14 MED ORDER — IBUPROFEN 800 MG PO TABS
800.0000 mg | ORAL_TABLET | Freq: Once | ORAL | Status: AC
  Administered 2011-03-14: 800 mg via ORAL
  Filled 2011-03-14: qty 1

## 2011-03-14 MED ORDER — RABIES IMMUNE GLOBULIN 150 UNIT/ML IM INJ
INJECTION | INTRAMUSCULAR | Status: AC
  Filled 2011-03-14: qty 12

## 2011-03-14 MED ORDER — AMOXICILLIN-POT CLAVULANATE 875-125 MG PO TABS
1.0000 | ORAL_TABLET | Freq: Once | ORAL | Status: AC
  Administered 2011-03-14: 1 via ORAL
  Filled 2011-03-14: qty 1

## 2011-03-14 MED ORDER — RABIES VACCINE, PCEC IM SUSR
1.0000 mL | Freq: Once | INTRAMUSCULAR | Status: AC
  Administered 2011-03-14: 1 mL via INTRAMUSCULAR
  Filled 2011-03-14: qty 1

## 2011-03-14 MED ORDER — TETANUS-DIPHTH-ACELL PERTUSSIS 5-2.5-18.5 LF-MCG/0.5 IM SUSP
0.5000 mL | Freq: Once | INTRAMUSCULAR | Status: AC
  Administered 2011-03-14: 0.5 mL via INTRAMUSCULAR
  Filled 2011-03-14 (×2): qty 0.5

## 2011-03-14 NOTE — ED Provider Notes (Signed)
History     CSN: [id number] Arrival date & time: 03/14/2011  9:58 PM   Chief Complaint  Patient presents with  . Animal Bite     (Include location/radiation/quality/duration/timing/severity/associated sxs/prior treatment) HPI Comments: Seen 2303.  Patient is a 77 y.o. female presenting with animal bite. The history is provided by the patient.  Animal Bite  The incident occurred today (Patient feed a stray cat daily x one yar. Today cat bit her on the left forearm while feeding.). The incident occurred at home. She came to the ER via personal transport. There is an injury to the left forearm. There have been no prior injuries to these areas. She is right-handed. Her tetanus status is UTD.     Past Medical History  Diagnosis Date  . Stroke   . MI (myocardial infarction)   . Hypertension      Past Surgical History  Procedure Date  . Stents      History reviewed. No pertinent family history.  History  Substance Use Topics  . Smoking status: Former Games developer  . Smokeless tobacco: Not on file  . Alcohol Use: No    OB History    Grav Para Term Preterm Abortions TAB SAB Ect Mult Living                  Review of Systems  Skin:       Scratch to left forearm  All other systems reviewed and are negative.    Allergies  Review of patient's allergies indicates no known allergies.  Home Medications   Current Outpatient Rx  Name Route Sig Dispense Refill  . ASPIRIN 325 MG PO TBEC Oral Take 325 mg by mouth daily.      Marland Kitchen CLOPIDOGREL BISULFATE 75 MG PO TABS Oral Take 75 mg by mouth daily.      Marland Kitchen DILTIAZEM HCL 120 MG PO TABS Oral Take 120 mg by mouth daily.      Marland Kitchen DOCUSATE SODIUM 50 MG PO CAPS Oral Take by mouth daily.      Marland Kitchen FERROUS SULFATE 325 (65 FE) MG PO TABS Oral Take 325 mg by mouth daily.      . FUROSEMIDE 40 MG PO TABS Oral Take 40 mg by mouth daily.      . ISOSORBIDE DINITRATE 30 MG PO TABS Oral Take 30 mg by mouth daily.      Marland Kitchen LEVOTHYROXINE SODIUM 25 MCG PO  TABS Oral Take 25 mcg by mouth daily.      Marland Kitchen METOPROLOL TARTRATE 25 MG PO TABS Oral Take 25 mg by mouth at bedtime.      . MULTI-VITAMIN/MINERALS PO TABS Oral Take 1 tablet by mouth daily.      Marland Kitchen FISH OIL 1200 MG PO CAPS Oral Take 1 capsule by mouth daily.      Marland Kitchen ROSUVASTATIN CALCIUM 20 MG PO TABS Oral Take 20 mg by mouth daily.        Physical Exam    BP 103/73  Pulse 81  Temp(Src) 98.7 F (37.1 C) (Oral)  Resp 14  Ht 5\' 6"  (1.676 m)  Wt 175 lb (79.379 kg)  BMI 28.25 kg/m2  SpO2 95%  Physical Exam  Nursing note and vitals reviewed. Constitutional: She is oriented to person, place, and time. She appears well-developed and well-nourished.  HENT:  Head: Normocephalic and atraumatic.  Eyes: EOM are normal.  Neck: Normal range of motion.  Cardiovascular: Normal rate, normal heart sounds and intact distal pulses.  Pulmonary/Chest: Breath sounds normal.  Musculoskeletal: Normal range of motion.  Neurological: She is alert and oriented to person, place, and time.  Skin:       1 cm scratch to left forearm with surrounding bruising.     ED Course  Procedures   No results found.  Patient with cat bite to left forearm. Cat has not had vaccinations. Rabies ordered. Antibiotics begun. Patient and son informed of clinical course, understand medical decision-making process, and agree with plan. MDM Reviewed: nursing note and vitals         Nicoletta Dress. Colon Branch, MD 03/15/11 0010

## 2011-03-14 NOTE — ED Notes (Signed)
Stray cat bite patient to left wrist, patient has been feeding this stray cat, unknown of shots or owner

## 2011-03-15 MED ORDER — AMOXICILLIN-POT CLAVULANATE 875-125 MG PO TABS: 1.0000 | ORAL_TABLET | Freq: Two times a day (BID) | ORAL | Status: AC

## 2011-03-17 ENCOUNTER — Encounter (HOSPITAL_COMMUNITY): Payer: Medicare Other | Attending: Emergency Medicine

## 2011-03-17 ENCOUNTER — Encounter (HOSPITAL_COMMUNITY): Payer: Self-pay

## 2011-03-17 DIAGNOSIS — Z203 Contact with and (suspected) exposure to rabies: Secondary | ICD-10-CM | POA: Insufficient documentation

## 2011-03-17 DIAGNOSIS — Z298 Encounter for other specified prophylactic measures: Secondary | ICD-10-CM | POA: Insufficient documentation

## 2011-03-17 DIAGNOSIS — W5501XA Bitten by cat, initial encounter: Secondary | ICD-10-CM

## 2011-03-17 DIAGNOSIS — Z2989 Encounter for other specified prophylactic measures: Secondary | ICD-10-CM | POA: Insufficient documentation

## 2011-03-17 DIAGNOSIS — Z23 Encounter for immunization: Secondary | ICD-10-CM | POA: Insufficient documentation

## 2011-03-17 HISTORY — DX: Bitten by cat, initial encounter: W55.01XA

## 2011-03-17 MED ORDER — RABIES VACCINE, PCEC IM SUSR
1.0000 mL | Freq: Once | INTRAMUSCULAR | Status: AC
  Administered 2011-03-17: 1 mL via INTRAMUSCULAR
  Filled 2011-03-17: qty 1

## 2011-03-17 NOTE — Progress Notes (Signed)
Brandy Mcguire presents today for injection per MD orders. Rabivert administered IM in right Deltoid. Administration without incident. Patient tolerated well.

## 2011-03-22 ENCOUNTER — Encounter (HOSPITAL_COMMUNITY): Payer: Medicare Other

## 2011-03-22 VITALS — BP 160/106 | HR 66

## 2011-03-22 DIAGNOSIS — W5501XA Bitten by cat, initial encounter: Secondary | ICD-10-CM

## 2011-03-22 MED ORDER — RABIES VACCINE, PCEC IM SUSR
1.0000 mL | Freq: Once | INTRAMUSCULAR | Status: AC
  Administered 2011-03-22: 1 mL via INTRAMUSCULAR
  Filled 2011-03-22: qty 1

## 2011-03-22 NOTE — Progress Notes (Signed)
Brandy Mcguire presents today for injection per MD orders. rab avert 1 cc administered im in right Upper Arm. Administration without incident. Patient tolerated well. Advised to notify primary care MD re increased BP

## 2011-03-29 ENCOUNTER — Encounter (HOSPITAL_COMMUNITY): Payer: Medicare Other | Attending: Family Medicine

## 2011-03-29 DIAGNOSIS — Z203 Contact with and (suspected) exposure to rabies: Secondary | ICD-10-CM | POA: Insufficient documentation

## 2011-03-29 DIAGNOSIS — Z23 Encounter for immunization: Secondary | ICD-10-CM | POA: Insufficient documentation

## 2011-03-29 DIAGNOSIS — W5501XA Bitten by cat, initial encounter: Secondary | ICD-10-CM

## 2011-03-29 MED ORDER — RABIES VACCINE, PCEC IM SUSR
1.0000 mL | Freq: Once | INTRAMUSCULAR | Status: AC
  Administered 2011-03-29: 1 mL via INTRAMUSCULAR
  Filled 2011-03-29: qty 1

## 2011-03-29 NOTE — Progress Notes (Signed)
Brandy Mcguire presents today for injection per MD orders. RabAvert administered IM in left Upper Arm. Administration without incident. Patient tolerated well.

## 2011-04-01 ENCOUNTER — Emergency Department (HOSPITAL_COMMUNITY)
Admission: EM | Admit: 2011-04-01 | Discharge: 2011-04-01 | Disposition: A | Discharge status: Home / Self Care | Payer: Medicare Other | Attending: Emergency Medicine | Admitting: Emergency Medicine

## 2011-04-01 ENCOUNTER — Encounter (HOSPITAL_COMMUNITY): Payer: Self-pay | Admitting: *Deleted

## 2011-04-01 ENCOUNTER — Emergency Department (HOSPITAL_COMMUNITY): Payer: Medicare Other

## 2011-04-01 DIAGNOSIS — IMO0001 Reserved for inherently not codable concepts without codable children: Secondary | ICD-10-CM | POA: Insufficient documentation

## 2011-04-01 DIAGNOSIS — S81809A Unspecified open wound, unspecified lower leg, initial encounter: Secondary | ICD-10-CM | POA: Insufficient documentation

## 2011-04-01 DIAGNOSIS — S81009A Unspecified open wound, unspecified knee, initial encounter: Secondary | ICD-10-CM | POA: Insufficient documentation

## 2011-04-01 DIAGNOSIS — W5501XA Bitten by cat, initial encounter: Secondary | ICD-10-CM

## 2011-04-01 MED ORDER — RABIES VACCINE, PCEC IM SUSR
1.0000 mL | Freq: Once | INTRAMUSCULAR | Status: AC
  Administered 2011-04-01: 1 mL via INTRAMUSCULAR
  Filled 2011-04-01: qty 1

## 2011-04-01 MED ORDER — TETANUS-DIPHTH-ACELL PERTUSSIS 5-2.5-18.5 LF-MCG/0.5 IM SUSP: 0.5000 mL | Freq: Once | INTRAMUSCULAR | Status: DC

## 2011-04-01 MED ORDER — AMOXICILLIN-POT CLAVULANATE 875-125 MG PO TABS: 1.0000 | ORAL_TABLET | Freq: Two times a day (BID) | ORAL | Status: AC

## 2011-04-01 NOTE — ED Notes (Signed)
Pt has what appears to be 3 puncture wounds to rt ankle

## 2011-04-01 NOTE — ED Notes (Signed)
Cat bite to right ankle, bruising and puncture marks noted

## 2011-04-01 NOTE — ED Notes (Signed)
Pt foot and ankle soaked in Betadine/Ns solution.

## 2011-04-01 NOTE — ED Provider Notes (Signed)
History     CSN: [id number] Arrival date & time: 04/01/2011  1:54 AM  Chief Complaint  Patient presents with  . Animal Bite    (Consider location/radiation/quality/duration/timing/severity/associated sxs/prior treatment) HPI Comments: Patient presents with her son with a complaint of a cat bite to the right ankle. This is a recurrent bite from the same animal that occurred 2 weeks ago. The animal was never found by animal control. This was an unprovoked attack where she opened the door to the house and the cat lunged at her foot from outside. This was acute in onset, pain is constant and is associated with bruising at the puncture wound sites. She finished a tetanus shot 2 weeks ago, one week of Augmentin and finished her last dose of rabies vaccination 3 days ago.  Patient is a 77 y.o. female presenting with animal bite. The history is provided by the patient, medical records and a relative.  Animal Bite  Pertinent negatives include no nausea and no vomiting.    Past Medical History  Diagnosis Date  . Stroke   . MI (myocardial infarction)   . Hypertension   . Bite from cat 03/17/2011    Past Surgical History  Procedure Date  . Stents      History reviewed. No pertinent family history.  History  Substance Use Topics  . Smoking status: Former Games developer  . Smokeless tobacco: Not on file  . Alcohol Use: No    OB History    Grav Para Term Preterm Abortions TAB SAB Ect Mult Living                  Review of Systems  Constitutional: Negative for fever and chills.  Gastrointestinal: Negative for nausea and vomiting.  Musculoskeletal: Negative for joint swelling.  Skin: Positive for wound.    Allergies  Review of patient's allergies indicates no known allergies.  Home Medications   Current Outpatient Rx  Name Route Sig Dispense Refill  . ASPIRIN 325 MG PO TBEC Oral Take 325 mg by mouth daily.      Marland Kitchen CLOPIDOGREL BISULFATE 75 MG PO TABS Oral Take 75 mg by mouth daily.       Marland Kitchen DILTIAZEM HCL 120 MG PO TABS Oral Take 120 mg by mouth daily.      Marland Kitchen DOCUSATE SODIUM 50 MG PO CAPS Oral Take by mouth daily.      Marland Kitchen FERROUS SULFATE 325 (65 FE) MG PO TABS Oral Take 325 mg by mouth daily.      . FUROSEMIDE 40 MG PO TABS Oral Take 40 mg by mouth daily.      . ISOSORBIDE DINITRATE 30 MG PO TABS Oral Take 30 mg by mouth daily.      Marland Kitchen LEVOTHYROXINE SODIUM 25 MCG PO TABS Oral Take 25 mcg by mouth daily.      Marland Kitchen METOPROLOL TARTRATE 25 MG PO TABS Oral Take 25 mg by mouth at bedtime.      . MULTI-VITAMIN/MINERALS PO TABS Oral Take 1 tablet by mouth daily.      Marland Kitchen FISH OIL 1200 MG PO CAPS Oral Take 1 capsule by mouth daily.      Marland Kitchen ROSUVASTATIN CALCIUM 20 MG PO TABS Oral Take 20 mg by mouth daily.        BP 108/56  Pulse 79  Temp(Src) 97.6 F (36.4 C) (Oral)  Resp 16  Ht 5\' 6"  (1.676 m)  Wt 175 lb (79.379 kg)  BMI 28.25 kg/m2  SpO2 94%  Physical Exam  Nursing note and vitals reviewed. Constitutional: She appears well-developed and well-nourished. No distress.  HENT:  Head: Normocephalic and atraumatic.  Eyes: Conjunctivae are normal. No scleral icterus.  Cardiovascular: Normal rate, regular rhythm and intact distal pulses.   Pulmonary/Chest: Effort normal and breath sounds normal.  Musculoskeletal: She exhibits tenderness. She exhibits no edema.       3 distinct Puncture wounds to the right dorsal ankle. Surrounding ecchymosis, no edema and no pain with range of motion of the ankle.  Neurological: She is alert.  Skin: Skin is warm and dry. No rash noted. She is not diaphoretic.    ED Course  Procedures (including critical care time)  Labs Reviewed - No data to display No results found.   No diagnosis found.    MDM  Recurrent cat bite, rule out foreign body, antibiotics, tetanus up-to-date,  Per CDC web site the recommendation for a previously vaccinated individual is Rabies Vaccine on days 0 and 3 only.  Animal control to be recontacted by staff.  X-ray  negative, wound irrigated with Betadine and water sterile saline solution, antibiotic started, x-ray confirms no foreign bodies.    04/01/2011  *RADIOLOGY REPORT*  Clinical Data: Cat bite anterior right ankle with three small puncture wounds and bruising.  RIGHT ANKLE - COMPLETE 3+ VIEW  Comparison: None.  Findings: No radiopaque foreign bodies or focal gas collections demonstrated in the subcutaneous soft tissues.  Bones appear intact.  No evidence of bone destruction or sclerosis.  No evidence of acute fracture or subluxation.  Small Achilles calcaneal spur.  IMPRESSION: No acute bony abnormalities demonstrated.  No radiopaque foreign bodies demonstrated in the soft tissues.  Original Report Authenticated By: Marlon Pel, M.D.      Vida Roller, MD 04/01/11 [phone]

## 2011-04-04 ENCOUNTER — Emergency Department (HOSPITAL_COMMUNITY)
Admission: EM | Admit: 2011-04-04 | Discharge: 2011-04-04 | Disposition: A | Discharge status: Home / Self Care | Payer: Medicare Other | Attending: Emergency Medicine | Admitting: Emergency Medicine

## 2011-04-04 ENCOUNTER — Encounter (HOSPITAL_COMMUNITY): Payer: Self-pay | Admitting: Emergency Medicine

## 2011-04-04 DIAGNOSIS — I509 Heart failure, unspecified: Secondary | ICD-10-CM | POA: Insufficient documentation

## 2011-04-04 DIAGNOSIS — I10 Essential (primary) hypertension: Secondary | ICD-10-CM | POA: Insufficient documentation

## 2011-04-04 DIAGNOSIS — S81809A Unspecified open wound, unspecified lower leg, initial encounter: Secondary | ICD-10-CM | POA: Insufficient documentation

## 2011-04-04 DIAGNOSIS — W5501XA Bitten by cat, initial encounter: Secondary | ICD-10-CM

## 2011-04-04 DIAGNOSIS — S81009A Unspecified open wound, unspecified knee, initial encounter: Secondary | ICD-10-CM | POA: Insufficient documentation

## 2011-04-04 DIAGNOSIS — Z8673 Personal history of transient ischemic attack (TIA), and cerebral infarction without residual deficits: Secondary | ICD-10-CM | POA: Insufficient documentation

## 2011-04-04 DIAGNOSIS — IMO0001 Reserved for inherently not codable concepts without codable children: Secondary | ICD-10-CM | POA: Insufficient documentation

## 2011-04-04 DIAGNOSIS — Z23 Encounter for immunization: Secondary | ICD-10-CM | POA: Insufficient documentation

## 2011-04-04 DIAGNOSIS — Z79899 Other long term (current) drug therapy: Secondary | ICD-10-CM | POA: Insufficient documentation

## 2011-04-04 DIAGNOSIS — Z87891 Personal history of nicotine dependence: Secondary | ICD-10-CM | POA: Insufficient documentation

## 2011-04-04 DIAGNOSIS — I252 Old myocardial infarction: Secondary | ICD-10-CM | POA: Insufficient documentation

## 2011-04-04 HISTORY — DX: Heart failure, unspecified: I50.9

## 2011-04-04 MED ORDER — RABIES VACCINE, PCEC IM SUSR
1.0000 mL | Freq: Once | INTRAMUSCULAR | Status: AC
  Administered 2011-04-04: 1 mL via INTRAMUSCULAR
  Filled 2011-04-04: qty 1

## 2011-04-04 MED ORDER — BACITRACIN-NEOMYCIN-POLYMYXIN 400-5-5000 EX OINT
TOPICAL_OINTMENT | Freq: Once | CUTANEOUS | Status: AC
  Administered 2011-04-04: 15:00:00 via TOPICAL
  Filled 2011-04-04: qty 1

## 2011-04-04 NOTE — ED Provider Notes (Signed)
History     CSN: [id number] Arrival date & time: 04/04/2011  1:03 PM  Chief Complaint  Patient presents with  . Wound Check  . Rabies Injection    (Consider location/radiation/quality/duration/timing/severity/associated sxs/prior treatment) HPI Comments: Pt was seen here ~ 1 month ago by a stray cate bite to the L hand.  She is here for the last in the rabies series.  Also, she has another wound on the R lower leg where the cat bit her again 4 days ago.  She is still on augmentin.  The history is provided by the patient. No language interpreter was used.    Past Medical History  Diagnosis Date  . Stroke   . MI (myocardial infarction)   . Hypertension   . Bite from cat 03/17/2011  . CHF (congestive heart failure)     Past Surgical History  Procedure Date  . Stents      kidneys and 2 in heart  . Abdominal hysterectomy     Family History  Problem Relation Age of Onset  . Cancer Mother   . Cancer Sister   . COPD Sister   . Heart failure Sister     History  Substance Use Topics  . Smoking status: Former Smoker -- 1.0 packs/day for 50 years    Types: Cigarettes    Quit date: 04/03/2001  . Smokeless tobacco: Never Used  . Alcohol Use: No    OB History    Grav Para Term Preterm Abortions TAB SAB Ect Mult Living   7 7 7       5       Review of Systems  Skin: Positive for wound.  All other systems reviewed and are negative.    Allergies  Review of patient's allergies indicates no known allergies.  Home Medications   Current Outpatient Rx  Name Route Sig Dispense Refill  . AMOXICILLIN-POT CLAVULANATE 875-125 MG PO TABS Oral Take 1 tablet by mouth every 12 (twelve) hours. 14 tablet 0  . ASPIRIN 325 MG PO TBEC Oral Take 325 mg by mouth daily.      Marland Kitchen CLOPIDOGREL BISULFATE 75 MG PO TABS Oral Take 75 mg by mouth daily.      Marland Kitchen DILTIAZEM HCL 120 MG PO TABS Oral Take 120 mg by mouth daily.      Marland Kitchen DOCUSATE SODIUM 50 MG PO CAPS Oral Take by mouth daily.      Marland Kitchen  FERROUS SULFATE 325 (65 FE) MG PO TABS Oral Take 325 mg by mouth daily.      . FUROSEMIDE 40 MG PO TABS Oral Take 40 mg by mouth daily.      . ISOSORBIDE DINITRATE 30 MG PO TABS Oral Take 30 mg by mouth daily.      Marland Kitchen LEVOTHYROXINE SODIUM 25 MCG PO TABS Oral Take 25 mcg by mouth daily.      Marland Kitchen METOPROLOL TARTRATE 25 MG PO TABS Oral Take 25 mg by mouth at bedtime.      . MULTI-VITAMIN/MINERALS PO TABS Oral Take 1 tablet by mouth daily.      Marland Kitchen FISH OIL 1200 MG PO CAPS Oral Take 1 capsule by mouth daily.      Marland Kitchen ROSUVASTATIN CALCIUM 20 MG PO TABS Oral Take 20 mg by mouth daily.        BP 124/89  Pulse 60  Temp(Src) 98.2 F (36.8 C) (Oral)  Resp 18  Ht 5\' 6"  (1.676 m)  Wt 175 lb (79.379 kg)  BMI 28.25  kg/m2  SpO2 96%  Physical Exam  Nursing note and vitals reviewed. Constitutional: She is oriented to person, place, and time. Vital signs are normal. She appears well-developed and well-nourished. No distress.  HENT:  Head: Normocephalic and atraumatic.  Right Ear: External ear normal.  Left Ear: External ear normal.  Nose: Nose normal.  Mouth/Throat: No oropharyngeal exudate.  Eyes: Conjunctivae and EOM are normal. Pupils are equal, round, and reactive to light. Right eye exhibits no discharge. Left eye exhibits no discharge. No scleral icterus.  Neck: Normal range of motion. Neck supple. No JVD present. No tracheal deviation present. No thyromegaly present.  Cardiovascular: Normal rate, regular rhythm, normal heart sounds, intact distal pulses and normal pulses.  Exam reveals no gallop and no friction rub.   No murmur heard. Pulmonary/Chest: Effort normal and breath sounds normal. No stridor. No respiratory distress. She has no wheezes. She has no rales. She exhibits no tenderness.  Abdominal: Soft. Normal appearance and bowel sounds are normal. She exhibits no distension and no mass. There is no tenderness. There is no rebound and no guarding.  Musculoskeletal: Normal range of motion. She  exhibits no edema and no tenderness.  Lymphadenopathy:    She has no cervical adenopathy.  Neurological: She is alert and oriented to person, place, and time. She has normal reflexes. Coordination normal. GCS eye subscore is 4. GCS verbal subscore is 5. GCS motor subscore is 6.  Skin: Skin is warm and dry. No rash noted. She is not diaphoretic.     Psychiatric: She has a normal mood and affect. Her speech is normal and behavior is normal. Judgment and thought content normal. Cognition and memory are normal.    ED Course  Procedures (including critical care time)  Labs Reviewed - No data to display No results found.   No diagnosis found.    MDM          Worthy Rancher, PA 04/04/11 [phone]

## 2011-04-04 NOTE — ED Notes (Signed)
Patient bitten on right ankle by wild cat on Wednesday.  Patient bite 2 weeks ago by same cat-started the rabies series and finished after 4 injections. Patient receiving 2 more rabies shots per [facility] for recent cat bite-here today for last rabies shot and wound check. Right ankle red and swollen-per son patient taking amoxicillin.

## 2011-04-04 NOTE — ED Provider Notes (Signed)
Medical screening examination/treatment/procedure(s) were performed by non-physician practitioner and as supervising physician I was immediately available for consultation/collaboration.  Lynn Sissel, MD 04/04/11 1533 

## 2011-04-04 NOTE — ED Notes (Signed)
Pt states that she was bitten by cat, is here for recheck of right ankle wound and her last rabies shot. Pt states that she was bitten by same cat recently and received the rabies series but was advised to come in for additional shot due to being bitten again. Pt also states that she has had some swelling to right ankle and leg area. Swelling is better when she is not on her leg, when asked if swelling is new, pt states that she has had swelling since getting bitten.

## 2011-05-12 ENCOUNTER — Other Ambulatory Visit: Payer: Self-pay | Admitting: Surgery

## 2011-05-12 DIAGNOSIS — D381 Neoplasm of uncertain behavior of trachea, bronchus and lung: Secondary | ICD-10-CM

## 2011-05-18 ENCOUNTER — Other Ambulatory Visit (HOSPITAL_COMMUNITY): Payer: Self-pay | Admitting: Family Medicine

## 2011-05-18 DIAGNOSIS — Z139 Encounter for screening, unspecified: Secondary | ICD-10-CM

## 2011-05-18 DIAGNOSIS — Z01419 Encounter for gynecological examination (general) (routine) without abnormal findings: Secondary | ICD-10-CM

## 2011-05-27 ENCOUNTER — Ambulatory Visit (HOSPITAL_COMMUNITY)
Admission: RE | Admit: 2011-05-27 | Discharge: 2011-05-27 | Disposition: A | Discharge status: Home / Self Care | Payer: Medicare Other | Source: Ambulatory Visit | Attending: Family Medicine | Admitting: Family Medicine

## 2011-05-27 DIAGNOSIS — Z780 Asymptomatic menopausal state: Secondary | ICD-10-CM | POA: Insufficient documentation

## 2011-05-27 DIAGNOSIS — Z01419 Encounter for gynecological examination (general) (routine) without abnormal findings: Secondary | ICD-10-CM

## 2011-05-27 DIAGNOSIS — Z139 Encounter for screening, unspecified: Secondary | ICD-10-CM

## 2011-05-27 DIAGNOSIS — Z13820 Encounter for screening for osteoporosis: Secondary | ICD-10-CM | POA: Insufficient documentation

## 2011-05-27 DIAGNOSIS — M899 Disorder of bone, unspecified: Secondary | ICD-10-CM | POA: Insufficient documentation

## 2011-05-27 DIAGNOSIS — Z1231 Encounter for screening mammogram for malignant neoplasm of breast: Secondary | ICD-10-CM | POA: Insufficient documentation

## 2011-06-07 DIAGNOSIS — R918 Other nonspecific abnormal finding of lung field: Secondary | ICD-10-CM

## 2011-06-07 DIAGNOSIS — I712 Thoracic aortic aneurysm, without rupture: Secondary | ICD-10-CM | POA: Insufficient documentation

## 2011-06-07 HISTORY — DX: Other nonspecific abnormal finding of lung field: R91.8

## 2011-06-08 ENCOUNTER — Ambulatory Visit
Admission: RE | Admit: 2011-06-08 | Discharge: 2011-06-08 | Disposition: A | Discharge status: Home / Self Care | Payer: Medicare Other | Source: Ambulatory Visit | Attending: Surgery | Admitting: Surgery

## 2011-06-08 ENCOUNTER — Encounter: Payer: Self-pay | Admitting: Surgery

## 2011-06-08 ENCOUNTER — Ambulatory Visit (INDEPENDENT_AMBULATORY_CARE_PROVIDER_SITE_OTHER): Payer: Medicare Other | Admitting: Surgery

## 2011-06-08 ENCOUNTER — Ambulatory Visit: Payer: Medicare Other | Admitting: Surgery

## 2011-06-08 VITALS — BP 125/78 | HR 60 | Resp 20 | Ht 60.0 in | Wt 179.0 lb

## 2011-06-08 DIAGNOSIS — R918 Other nonspecific abnormal finding of lung field: Secondary | ICD-10-CM

## 2011-06-08 DIAGNOSIS — D381 Neoplasm of uncertain behavior of trachea, bronchus and lung: Secondary | ICD-10-CM

## 2011-06-08 DIAGNOSIS — I712 Thoracic aortic aneurysm, without rupture: Secondary | ICD-10-CM

## 2011-06-08 NOTE — Progress Notes (Signed)
[address]            [place] [address]          [phone]       HPI:  The patient returns today for followup of the ascending and descending thoracic aortic aneurysms as well as multiple small bilateral pulmonary nodules seen on previous CT scans. She has multiple severe medical problems including coronary disease, status post myocardial infarction and stenting, oxygen-dependent COPD, stage III chronic kidney disease, congestive heart failure, and prior stroke. She reports that in September of this year she was bit on the hand by cat and had undergo treatment for possible rabies exposure. Her son is here with her today and said that she has been doing poorly in general.  Current Outpatient Prescriptions  Medication Sig Dispense Refill  . aspirin 325 MG EC tablet Take 325 mg by mouth daily.        . budesonide-formoterol (SYMBICORT) 80-4.5 MCG/ACT inhaler Inhale 2 puffs into the lungs 2 (two) times daily.        . clopidogrel (PLAVIX) 75 MG tablet Take 75 mg by mouth daily.        Marland Kitchen diltiazem (CARDIZEM) 120 MG tablet Take 120 mg by mouth daily.        Marland Kitchen docusate sodium (COLACE) 50 MG capsule Take by mouth daily.        . ferrous sulfate 325 (65 FE) MG tablet Take 325 mg by mouth daily.        . furosemide (LASIX) 40 MG tablet Take 40 mg by mouth daily.        . isosorbide dinitrate (ISORDIL) 30 MG tablet Take 30 mg by mouth daily.        Marland Kitchen levothyroxine (SYNTHROID, LEVOTHROID) 25 MCG tablet Take 25 mcg by mouth daily.        . metoprolol tartrate (LOPRESSOR) 25 MG tablet Take 25 mg by mouth at bedtime.        . Multiple Vitamins-Minerals (MULTIVITAMIN WITH MINERALS) tablet Take 1 tablet by mouth daily.        . Omega-3 Fatty Acids (FISH OIL) 1200 MG CAPS Take 1 capsule by mouth daily.        . Potassium 99 MG TABS Take 1 tablet by mouth 1 day or 1 dose.        . rosuvastatin (CRESTOR) 20 MG tablet Take 20 mg by mouth daily.        . traZODone  (DESYREL) 50 MG tablet Take 50 mg by mouth at bedtime.           Physical Exam: BP 125/78  Pulse 60  Resp 20  Ht 5' (1.524 m)  Wt 179 lb (81.194 kg)  BMI 34.96 kg/m2  SpO2 95%  She is a disheveled, elderly white female in no acute distress who came in a wheelchair. Cardiac exam shows a regular rate and rhythm with a grade 2/6 systolic murmur over the aortic valve. Lung exam reveals decreased breath sounds throughout with bilateral rales. Extremity exam shows marked bilateral lower extremity edema.  Diagnostic Tests:    *RADIOLOGY REPORT*   Clinical Data: History of aortic aneurysm and lung nodules, follow- up   CT CHEST WITHOUT CONTRAST   Technique:  Multidetector CT imaging of the chest was performed following the standard protocol without IV contrast.   Comparison: CT chest of 06/23/2010  and 11/26/2009   Findings: On the lung window images, the dominant nodule previously described at the right lung base is completely stable.  A nodule in the right upper lobe on the prior study is no longer seen.  There are however is slightly more prominent small nodular opacities just above the left hemidiaphragm within the left lower lobe of questionable significance, possibly inflammatory in nature.  Since these nodules would not be visible on chest x-ray, and additional follow-up CT the chest in 4-6 months may be warranted to assess stability.  No infiltrate is seen.  No pleural effusion is noted.   On soft tissue window images, probable scarring in the right axilla appears stable.  No axillary adenopathy is seen.  On this unenhanced study no mediastinal or hilar adenopathy is seen with small mediastinal nodes remaining stable.  The ascending aorta measures 4.7 x 4.6 cm compared to of 4.6 x 4.4 cm previously. Atheromatous change Is noted diffusely throughout the entire thoracic aorta.  The descending thoracic aorta is ectatic and becomes somewhat aneurysmal just above the  diaphragmatic hiatus where it measures 5.0 cm, compared to 4.9 cm at that site previously.  There is also aneurysmal dilatation of the suprarenal abdominal aorta measuring up to 5.0 cm in diameter.  An atrophic right kidney is noted with a left renal artery stent noted.  No acute bony abnormality is seen with thoracic kyphosis present.   IMPRESSION:   1.  No enlarging pulmonary nodule is seen.  There are new small nodules just above the left hemidiaphragm within the left lower lobe of questionable significance and continued follow-up CT is recommended to assess stability. 2.  Stable fusiform dilatation of the ascending aorta measuring 4.7 x 4.6 cm. 3.  Stable dilatation of the distal descending thoracic aorta measuring 5.0 cm with fusiform dilatation of the suprarenal abdominal aorta measuring 5.0 cm in maximum diameter as well. 4.  Atrophy of the right kidney.   Original Report Authenticated By: Juline Patch, M.D.       Impression:  She has multiple small bilateral pulmonary nodules which appears stable. There are new small nodules just above the left hemidiaphragm which are very small and of questionable significance and may be inflammatory. There is stable fusiform aneurysmal dilatation of the ascending aorta and stable dilatation of the distal descending thoracic and suprarenal abdominal aorta. CT scan of the abdomen was not done to evaluate the infrarenal abdominal aorta. Given her age and multiple comorbidities I don't think she would be a surgical candidate for resection of any pulmonary lesions or aneurysm repair. I discussed this with the patient's son who seems to understand her general condition quite well and is in agreement that no further CT scan followup is warranted. I discussed the importance of good blood pressure control with them.  Plan:  She will continue to followup with Dr. Allyson Sabal and her primary physician.

## 2011-12-26 IMAGING — CR DG CHEST 1V PORT
1 series · 1 of 1 positions shown · non-contrast
Comparison: Portable chest x-ray of 07/24/2010

CLINICAL DATA: Weakness, shortness of breath, history of recent
myocardial infarction

PORTABLE CHEST - 1 VIEW

[view not recorded]
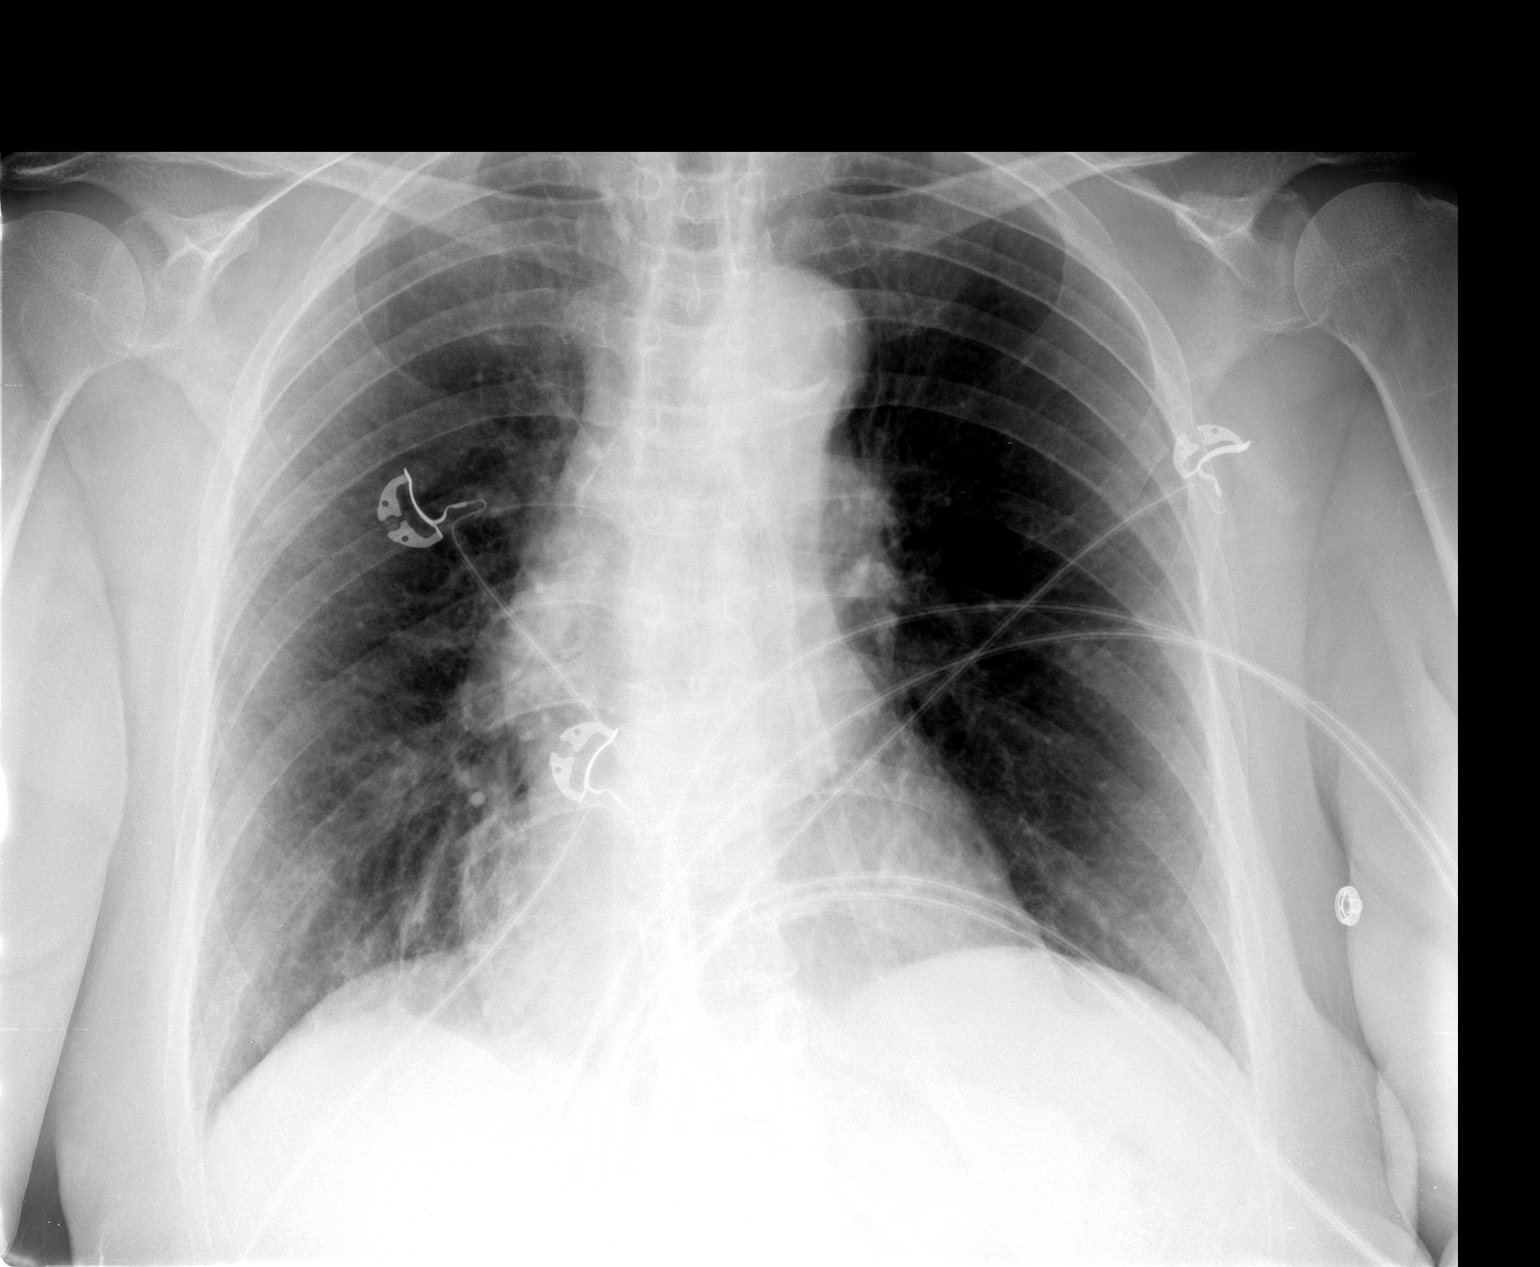

[1 of 1 positions shown; findings below may reference images not displayed]

FINDINGS: There has been resolution of airspace disease.  Only
minimal opacity remains at the right lung base most consistent with
linear atelectasis.  No effusion is seen.  Heart size is stable.
Mediastinal contours are stable
IMPRESSION: Resolution of airspace disease with minimal opacity remaining at
the right lung base most consistent with atelectasis.

## 2013-01-24 ENCOUNTER — Other Ambulatory Visit: Payer: Self-pay | Admitting: *Deleted

## 2013-01-24 ENCOUNTER — Ambulatory Visit: Payer: Medicare Other | Admitting: Cardiovascular Disease

## 2013-01-24 MED ORDER — AMLODIPINE BESYLATE 5 MG PO TABS: 5.0000 mg | ORAL_TABLET | Freq: Every day | ORAL | Status: DC

## 2013-01-24 NOTE — Telephone Encounter (Signed)
Rx was sent to pharmacy electronically. 

## 2013-01-25 ENCOUNTER — Ambulatory Visit (INDEPENDENT_AMBULATORY_CARE_PROVIDER_SITE_OTHER): Payer: Medicare Other | Admitting: Physician Assistant

## 2013-01-25 ENCOUNTER — Encounter: Payer: Self-pay | Admitting: Physician Assistant

## 2013-01-25 VITALS — BP 106/72 | Ht 66.0 in | Wt 162.6 lb

## 2013-01-25 DIAGNOSIS — I4891 Unspecified atrial fibrillation: Secondary | ICD-10-CM

## 2013-01-25 DIAGNOSIS — Z8673 Personal history of transient ischemic attack (TIA), and cerebral infarction without residual deficits: Secondary | ICD-10-CM

## 2013-01-25 DIAGNOSIS — I2510 Atherosclerotic heart disease of native coronary artery without angina pectoris: Secondary | ICD-10-CM

## 2013-01-25 DIAGNOSIS — I712 Thoracic aortic aneurysm, without rupture, unspecified: Secondary | ICD-10-CM

## 2013-01-25 DIAGNOSIS — I10 Essential (primary) hypertension: Secondary | ICD-10-CM

## 2013-01-25 DIAGNOSIS — I701 Atherosclerosis of renal artery: Secondary | ICD-10-CM

## 2013-01-25 NOTE — Progress Notes (Signed)
Date:  01/25/2013   ID:  Brandy Mcguire, DOB 1933-07-17, MRN [id number]  PCP:  Colette Ribas, MD    Primary Cardiologist:  Allyson Sabal     History of Present Illness: Brandy Mcguire is a 79 y.o. female widowed Caucasian female, mother of 5, grandmother to 5 grandchildren who is accompanied by her son today. She has not been seen by Dr. Allyson Sabal since July 2013.  She has a history of remote tobacco abuse, having quit in 2001.  She has a very calcified kidney. She had a TIA in the past. She had a stent placed to the left renal artery 06/07/2000 by Dr. Allyson Sabal.  She has had a thoracic and abdominal aortic aneurysm and was evaluated by Dr. Evelene Croon but after discussion with her and her son they decided to stop doing followup CT scans.  Last one was in December 2012.  She has had stenting of her proximal RCA by Dr. Allyson Sabal with a Vision bare-metal stent. Her last catheterization by Dr. Tresa Endo on July 21, 2010, revealed patent stents with noncritical CAD.  She has a history of atrial fibrillation with RVR, non-ST-segment elevation myocardial infarction.  Last 2-D echocardiogram was May 2001 showed normal EF, normal wall motion and diastolic dysfunction  Patient states that she has indigestion type chest pain approximately once per month.  There are no associated symptoms i.e. radiation to arm, neck, jaw, nausea, diaphoresis. She denies dizziness or shortness of breath. She cannot tell that her heart rate is elevated.  She presents today due to changes in her EKG seen yesterday in her primary care provider's office.  The patient currently denies  vomiting, fever, orthopnea, dizziness, PND, cough, congestion, abdominal pain, hematochezia, melena, lower extremity edema.  Last cardiac catheterization June or 2012:  no significant change in the 60% left  main stenosis. Calcification in the left anterior descending   system with luminal irregularities of 20% proximal and mid segment, 40% distal  narrowing, 40% to 50%  proximal stenosis in the  circumflex coronary artery, ostial calcification of the right   coronary artery within proximal RCA stent with intimal hyperplasia  narrowing of 40% to 50%, 30% narrowing of  the distal RCA stent.  Recommendations are for medical therapy.   Wt Readings from Last 3 Encounters:  01/25/13 162 lb 9.6 oz (73.755 kg)  06/08/11 179 lb (81.194 kg)  04/04/11 175 lb (79.379 kg)     Past Medical History  Diagnosis Date  . Stroke   . MI (myocardial infarction)   . Hypertension   . Bite from cat 03/17/2011  . CHF (congestive heart failure)     Current Outpatient Prescriptions  Medication Sig Dispense Refill  . amLODipine (NORVASC) 5 MG tablet Take 1 tablet (5 mg total) by mouth daily.  30 tablet  2  . aspirin 325 MG EC tablet Take 325 mg by mouth daily.        . budesonide-formoterol (SYMBICORT) 80-4.5 MCG/ACT inhaler Inhale 2 puffs into the lungs 2 (two) times daily.        . clopidogrel (PLAVIX) 75 MG tablet Take 75 mg by mouth daily.        Marland Kitchen diltiazem (CARDIZEM) 120 MG tablet Take 120 mg by mouth daily.        Marland Kitchen docusate sodium (COLACE) 50 MG capsule Take by mouth daily.        . furosemide (LASIX) 40 MG tablet Take 40 mg by mouth daily.        Marland Kitchen  levothyroxine (SYNTHROID, LEVOTHROID) 25 MCG tablet Take 25 mcg by mouth daily.        . metoprolol tartrate (LOPRESSOR) 25 MG tablet Take 12.5 mg by mouth 2 (two) times daily.       . Multiple Vitamins-Minerals (MULTIVITAMIN WITH MINERALS) tablet Take 1 tablet by mouth daily.        . Omega-3 Fatty Acids (FISH OIL) 1200 MG CAPS Take 1 capsule by mouth daily.        . rosuvastatin (CRESTOR) 20 MG tablet Take 20 mg by mouth daily.        . traZODone (DESYREL) 50 MG tablet Take 50 mg by mouth at bedtime.        . isosorbide dinitrate (ISORDIL) 30 MG tablet Take 30 mg by mouth daily.        . Potassium 99 MG TABS Take 1 tablet by mouth 1 day or 1 dose.         No current facility-administered  medications for this visit.    Allergies:   No Known Allergies  Social History:  The patient  reports that she quit smoking about 11 years ago. Her smoking use included Cigarettes. She has a 50 pack-year smoking history. She has never used smokeless tobacco. She reports that she does not drink alcohol or use illicit drugs.   Family history:   Family History  Problem Relation Age of Onset  . Cancer Mother   . Cancer Sister   . COPD Sister   . Heart failure Sister     ROS:  Please see the history of present illness.  All other systems reviewed and negative.   PHYSICAL EXAM: VS:  BP 106/72  Ht 5\' 6"  (1.676 m)  Wt 162 lb 9.6 oz (73.755 kg)  BMI 26.26 kg/m2 Well nourished, well developed, in no acute distress HEENT: Pupils are equal round react to light accommodation extraocular movements are intact.  Neck: no JVDNo cervical lymphadenopathy. Cardiac: Irregular rate and rhythm without murmurs rubs or gallops. Lungs:  clear to auscultation bilaterally, no wheezing, rhonchi or rales Abd: soft, nontender, positive bowel sounds all quadrants Ext: no lower extremity edema.  1+ radial and 2+ dorsalis pedis, PT and rachial pulses. Skin: warm and dry Neuro:  Grossly normal, strength is 4/5 and equal bilaterally upper and lower extremities.  EKG:  Atrial fibrillation right bundle branch block, rate 110 beats per minute    ASSESSMENT AND PLAN:  Problem List Items Addressed This Visit   CAD (coronary artery disease)   Atrial fibrillation with rapid ventricular response     Patient is asymptomatic and cannot tell that her heart rate is elevated and that it is regular. I will have her start a full dose aspirin however, given her history of thoracic and abdominal aortic aneurysms, do not think it's appropriate to start her on full anticoagulation.  I have considered increasing her beta blocker however, blood pressure is borderline at this time and has been on the low side previous office visits.    Should she become symptomatic to consider cardioversion along with TEE.  Followup with Dr. Allyson Sabal in one month.    Ascending and descending thoracic aortic aneurysms  - Primary     Was followed by Dr. Laneta Simmers.  He and the patient's son agree that she is not a surgical candidate and further monitoring with CT scan is not necessary.  Last CT of the chest was Nov 2012.

## 2013-01-25 NOTE — Assessment & Plan Note (Signed)
Patient is asymptomatic and cannot tell that her heart rate is elevated and that it is regular. I will have her start a full dose aspirin however, given her history of thoracic and abdominal aortic aneurysms, do not think it's appropriate to start her on full anticoagulation.  I have considered increasing her beta blocker however, blood pressure is borderline at this time and has been on the low side previous office visits.   Should she become symptomatic to consider cardioversion along with TEE.  Followup with Dr. Allyson Sabal in one month.

## 2013-01-25 NOTE — Assessment & Plan Note (Addendum)
Was followed by Dr. Laneta Simmers.  He and the patient's son agree that she is not a surgical candidate and further monitoring with CT scan is not necessary.  Last CT of the chest was Nov 2012.

## 2013-01-25 NOTE — Patient Instructions (Signed)
Start taking aspirin 325 mg daily.  She developed shortness of breath or dizziness, call our office immediately for an appointment.  Schedule you to see Dr. Allyson Sabal in approximately one month.

## 2013-01-26 ENCOUNTER — Encounter: Payer: Self-pay | Admitting: Cardiovascular Disease

## 2013-02-21 ENCOUNTER — Ambulatory Visit: Payer: Medicare Other | Admitting: Cardiovascular Disease

## 2013-02-22 ENCOUNTER — Other Ambulatory Visit: Payer: Self-pay | Admitting: Cardiovascular Disease

## 2013-02-22 NOTE — Telephone Encounter (Signed)
Rx was sent to pharmacy electronically. 

## 2013-02-28 ENCOUNTER — Encounter: Payer: Self-pay | Admitting: Neurology

## 2013-03-01 ENCOUNTER — Encounter: Payer: Self-pay | Admitting: Neurology

## 2013-03-01 ENCOUNTER — Ambulatory Visit (INDEPENDENT_AMBULATORY_CARE_PROVIDER_SITE_OTHER): Payer: Medicare Other | Admitting: Neurology

## 2013-03-01 VITALS — BP 130/92 | HR 104 | Ht 64.0 in | Wt 173.0 lb

## 2013-03-01 DIAGNOSIS — E039 Hypothyroidism, unspecified: Secondary | ICD-10-CM

## 2013-03-01 DIAGNOSIS — R4189 Other symptoms and signs involving cognitive functions and awareness: Secondary | ICD-10-CM | POA: Insufficient documentation

## 2013-03-01 DIAGNOSIS — F09 Unspecified mental disorder due to known physiological condition: Secondary | ICD-10-CM

## 2013-03-01 DIAGNOSIS — E538 Deficiency of other specified B group vitamins: Secondary | ICD-10-CM

## 2013-03-01 MED ORDER — RIVASTIGMINE 4.6 MG/24HR TD PT24: 1.0000 | MEDICATED_PATCH | Freq: Every day | TRANSDERMAL | Status: DC
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
## 2013-03-01 NOTE — Patient Instructions (Addendum)
Overall you are doing fairly well but I do want to suggest a few things today:   Remember to drink plenty of fluid, eat healthy meals and do not skip any meals. Try to eat protein with a every meal and eat a healthy snack such as fruit or nuts in between meals. Try to keep a regular sleep-wake schedule and try to exercise daily, particularly in the form of walking, 20-30 minutes a day, if you can.   As far as your medications are concerned, I would like to suggest  As far as diagnostic testing: I would like to do some blood work and order a MRI to check for reversible causes of your memory decline.  I would like to start a medication called Exelon patch. It will help improve your memory.   I would like to see you back in 4 to 6 months, sooner if we need to. Please call us with any interim questions, concerns, problems, updates or refill requests.   Please also call us for any test results so we can go over those with you on the phone.  My clinical assistant and will answer any of your questions and relay your messages to me and also relay most of my messages to you.   Our phone number is [phone]. We also have an after hours call service for urgent matters and there is a physician on-call for urgent questions. For any emergencies you know to call 911 or go to the nearest emergency room

## 2013-03-02 LAB — HGB A1C W/O EAG: Hgb A1c MFr Bld: 6 % — ABNORMAL HIGH (ref 4.8–5.6)

## 2013-03-02 LAB — VITAMIN B12: Vitamin B-12: 369 pg/mL (ref 211–946)

## 2013-03-02 LAB — TSH: TSH: 4.17 u[IU]/mL (ref 0.450–4.500)

## 2013-03-05 LAB — VITAMIN B1, WHOLE BLOOD: Thiamine: 210.6 nmol/L — ABNORMAL HIGH (ref 66.5–200.0)

## 2013-03-05 NOTE — Progress Notes (Signed)
Quick Note:  I called pt and relayed normal lab results to pt as per Dr. Minus Breeding comment. ______

## 2013-03-06 ENCOUNTER — Telehealth: Payer: Self-pay | Admitting: Cardiovascular Disease

## 2013-03-06 NOTE — Telephone Encounter (Signed)
Message from [facility] in Medical Records: Records for this patient faxed to them. ST  Returned call.  Left message to call back tomorrow before 4pm and that MR staff indicated records were faxed.

## 2013-03-06 NOTE — Telephone Encounter (Signed)
[facility] is trying to schedule MRI of brain for patient.  Patient can't tell them whether she has stents in her heart or what she has wrong with her heart.  THey need this information before they can schedule the test.

## 2013-03-13 ENCOUNTER — Inpatient Hospital Stay: Admission: RE | Admit: 2013-03-13 | Payer: Medicare Other | Source: Ambulatory Visit

## 2013-03-20 ENCOUNTER — Encounter: Payer: Self-pay | Admitting: Cardiovascular Disease

## 2013-03-21 ENCOUNTER — Encounter: Payer: Self-pay | Admitting: Cardiovascular Disease

## 2013-03-28 ENCOUNTER — Other Ambulatory Visit: Payer: Self-pay | Admitting: *Deleted

## 2013-03-28 MED ORDER — FUROSEMIDE 40 MG PO TABS: 40.0000 mg | ORAL_TABLET | Freq: Every day | ORAL | Status: DC

## 2013-04-17 ENCOUNTER — Encounter (HOSPITAL_COMMUNITY): Payer: Self-pay | Admitting: Emergency Medicine

## 2013-04-17 ENCOUNTER — Emergency Department (HOSPITAL_COMMUNITY): Payer: Medicare Other

## 2013-04-17 ENCOUNTER — Other Ambulatory Visit: Payer: Self-pay

## 2013-04-17 ENCOUNTER — Emergency Department (HOSPITAL_COMMUNITY)
Admission: EM | Admit: 2013-04-17 | Discharge: 2013-04-17 | Disposition: A | Discharge status: Home / Self Care | Payer: Medicare Other | Attending: Emergency Medicine | Admitting: Emergency Medicine

## 2013-04-17 DIAGNOSIS — Z7982 Long term (current) use of aspirin: Secondary | ICD-10-CM | POA: Insufficient documentation

## 2013-04-17 DIAGNOSIS — Z8673 Personal history of transient ischemic attack (TIA), and cerebral infarction without residual deficits: Secondary | ICD-10-CM | POA: Insufficient documentation

## 2013-04-17 DIAGNOSIS — I712 Thoracic aortic aneurysm, without rupture, unspecified: Secondary | ICD-10-CM | POA: Insufficient documentation

## 2013-04-17 DIAGNOSIS — I10 Essential (primary) hypertension: Secondary | ICD-10-CM | POA: Insufficient documentation

## 2013-04-17 DIAGNOSIS — I509 Heart failure, unspecified: Secondary | ICD-10-CM | POA: Insufficient documentation

## 2013-04-17 DIAGNOSIS — Z79899 Other long term (current) drug therapy: Secondary | ICD-10-CM | POA: Insufficient documentation

## 2013-04-17 DIAGNOSIS — I252 Old myocardial infarction: Secondary | ICD-10-CM | POA: Insufficient documentation

## 2013-04-17 DIAGNOSIS — N390 Urinary tract infection, site not specified: Secondary | ICD-10-CM | POA: Insufficient documentation

## 2013-04-17 DIAGNOSIS — E871 Hypo-osmolality and hyponatremia: Secondary | ICD-10-CM | POA: Insufficient documentation

## 2013-04-17 DIAGNOSIS — Z87891 Personal history of nicotine dependence: Secondary | ICD-10-CM | POA: Insufficient documentation

## 2013-04-17 LAB — URINALYSIS, ROUTINE W REFLEX MICROSCOPIC
Glucose, UA: NEGATIVE mg/dL
Ketones, ur: NEGATIVE mg/dL
Protein, ur: NEGATIVE mg/dL
pH: 7 (ref 5.0–8.0)

## 2013-04-17 LAB — CBC WITH DIFFERENTIAL/PLATELET
Basophils Absolute: 0 10*3/uL (ref 0.0–0.1)
Basophils Relative: 0 % (ref 0–1)
Eosinophils Absolute: 0.1 10*3/uL (ref 0.0–0.7)
Eosinophils Relative: 2 % (ref 0–5)
HCT: 37.4 % (ref 36.0–46.0)
Hemoglobin: 12.6 g/dL (ref 12.0–15.0)
Lymphocytes Relative: 34 % (ref 12–46)
Lymphs Abs: 1.7 10*3/uL (ref 0.7–4.0)
MCH: 29.7 pg (ref 26.0–34.0)
MCHC: 33.7 g/dL (ref 30.0–36.0)
MCV: 88.2 fL (ref 78.0–100.0)
Monocytes Absolute: 0.5 10*3/uL (ref 0.1–1.0)
Monocytes Relative: 11 % (ref 3–12)
Neutro Abs: 2.6 10*3/uL (ref 1.7–7.7)
Neutrophils Relative %: 53 % (ref 43–77)
Platelets: 207 10*3/uL (ref 150–400)
RBC: 4.24 MIL/uL (ref 3.87–5.11)
RDW: 14.4 % (ref 11.5–15.5)
WBC: 5 10*3/uL (ref 4.0–10.5)

## 2013-04-17 LAB — COMPREHENSIVE METABOLIC PANEL
ALT: 6 U/L (ref 0–35)
AST: 25 U/L (ref 0–37)
Albumin: 3.9 g/dL (ref 3.5–5.2)
CO2: 30 mEq/L (ref 19–32)
Chloride: 84 mEq/L — ABNORMAL LOW (ref 96–112)
Creatinine, Ser: 2.28 mg/dL — ABNORMAL HIGH (ref 0.50–1.10)
GFR calc non Af Amer: 20 mL/min — ABNORMAL LOW (ref >90)
Sodium: 126 mEq/L — ABNORMAL LOW (ref 135–145)
Total Bilirubin: 0.5 mg/dL (ref 0.3–1.2)

## 2013-04-17 LAB — URINE MICROSCOPIC-ADD ON

## 2013-04-17 LAB — TROPONIN I: Troponin I: <0.3 ng/mL

## 2013-04-17 MED ORDER — SODIUM CHLORIDE 0.9 % IV BOLUS (SEPSIS)
500.0000 mL | Freq: Once | INTRAVENOUS | Status: AC
  Administered 2013-04-17: 500 mL via INTRAVENOUS

## 2013-04-17 MED ORDER — DEXTROSE 5 % IV SOLN
1.0000 g | Freq: Once | INTRAVENOUS | Status: AC
  Administered 2013-04-17: 1 g via INTRAVENOUS
  Filled 2013-04-17: qty 10

## 2013-04-17 MED ORDER — ALBUTEROL SULFATE (5 MG/ML) 0.5% IN NEBU
2.5000 mg | INHALATION_SOLUTION | RESPIRATORY_TRACT | Status: DC
  Administered 2013-04-17: 2.5 mg via RESPIRATORY_TRACT
  Filled 2013-04-17: qty 0.5

## 2013-04-17 MED ORDER — CEFPODOXIME PROXETIL 100 MG PO TABS: 100.0000 mg | ORAL_TABLET | Freq: Two times a day (BID) | ORAL | Status: DC

## 2013-04-17 MED ORDER — IPRATROPIUM BROMIDE 0.02 % IN SOLN
0.5000 mg | RESPIRATORY_TRACT | Status: DC
  Administered 2013-04-17: 0.5 mg via RESPIRATORY_TRACT
  Filled 2013-04-17: qty 2.5

## 2013-04-17 NOTE — ED Notes (Signed)
Patient with no complaints at this time. Respirations even and unlabored. Skin warm/dry. Discharge instructions reviewed with patient at this time. Patient given opportunity to voice concerns/ask questions. IV removed per policy and band-aid applied to site. Patient discharged at this time and left Emergency Department with steady gait.  

## 2013-04-17 NOTE — ED Notes (Signed)
IV infiltrated in Right Posterior forearm. Rocephin and bolus half way done. MD aware and requests that new IV be started. New IV initiated in Right AC on second attempt. Patient tolerated well.

## 2013-04-17 NOTE — ED Provider Notes (Signed)
Date: 04/17/2013 12:05 PM  Rate: 77  Rhythm: Atrial fibrillation  QRS Axis: LAD  Intervals: LAFB and RBBB   ST/T Wave abnormalities: normal  Conduction Disutrbances: none  Narrative Interpretation: A fib which is new compared to prior, left anterior fascicular block, right bundle branch block that are unchanged, no new ischemic changes      Layla Maw Ward, DO 04/17/13 1635

## 2013-04-17 NOTE — ED Notes (Signed)
Pt sent by husband via EMS for hypotension. Initial BP by EMS was 132/70 and has been stable while in route. Husband also told EMS that pt was stumbling around yesterday. Pt reports taking laxatives. NAD PT is a/o x 3

## 2013-04-17 NOTE — ED Provider Notes (Signed)
TIME SEEN: 11:20  CHIEF COMPLAINT: Low blood pressure  HPI: Pt is 79 y.o. with history of hypertension, MI, CVA who was brought to the ED via EMS after her son took her blood pressure this morning at home and it was low. Pt states that she hasn't been feeling well lately but is unable to elaborate. She takes one laxative per week at baseline because it is hard for her to BM. Pt denies chest pain, SOB, fever, cough, vomiting, diarrhea, blood in stool, black tarry stools, no abdominal pain. States that she's been eating and drinking well.  ROS: See HPI Constitutional: no fever  Eyes: no drainage  ENT: no runny nose   Cardiovascular:  no chest pain  Resp: no SOB, no cough,   GI: no vomiting, no nausea, no diarrhea, no blood in stool, no black tarry stools, no abdominal pain GU: no dysuria Integumentary: no rash  Allergy: no hives  Musculoskeletal: no leg swelling  Neurological: no slurred speech ROS otherwise negative  PAST MEDICAL HISTORY/PAST SURGICAL HISTORY:  Past Medical History  Diagnosis Date  . Stroke   . MI (myocardial infarction)   . Hypertension   . Bite from cat 03/17/2011  . CHF (congestive heart failure)     MEDICATIONS:  Prior to Admission medications   Medication Sig Start Date End Date Taking? Authorizing Provider  amLODipine (NORVASC) 5 MG tablet Take 1 tablet (5 mg total) by mouth daily. 01/24/13  Yes Runell Gess, MD  aspirin 325 MG EC tablet Take 325 mg by mouth daily.     Yes Historical Provider, MD  clopidogrel (PLAVIX) 75 MG tablet Take 75 mg by mouth daily.   Yes Historical Provider, MD  furosemide (LASIX) 40 MG tablet Take 1 tablet (40 mg total) by mouth daily. 03/28/13  Yes Runell Gess, MD  levothyroxine (SYNTHROID, LEVOTHROID) 25 MCG tablet Take 25 mcg by mouth daily.     Yes Historical Provider, MD  metoprolol tartrate (LOPRESSOR) 25 MG tablet Take 12.5 mg by mouth 2 (two) times daily.   Yes Historical Provider, MD  Potassium 99 MG TABS Take 1  tablet by mouth daily.   Yes Historical Provider, MD  rosuvastatin (CRESTOR) 20 MG tablet Take 20 mg by mouth daily.   Yes Historical Provider, MD  senna-docusate (SENOKOT-S) 8.6-50 MG per tablet Take 1 tablet by mouth once a week.   Yes Historical Provider, MD  traZODone (DESYREL) 50 MG tablet Take 50 mg by mouth at bedtime.     Yes Historical Provider, MD    ALLERGIES:  No Known Allergies  SOCIAL HISTORY:  History  Substance Use Topics  . Smoking status: Former Smoker -- 1.00 packs/day for 50 years    Types: Cigarettes    Quit date: 04/03/2001  . Smokeless tobacco: Never Used  . Alcohol Use: No    FAMILY HISTORY: Family History  Problem Relation Age of Onset  . Cancer Mother   . Cancer Sister   . COPD Sister   . Heart failure Sister     EXAM: Triage Vitals: BP 116/78  Pulse 73  Temp(Src) 98.2 F (36.8 C) (Oral)  Resp 21  SpO2 92% CONSTITUTIONAL: Alert and oriented and responds appropriately to questions. Well-appearing; well-nourished HEAD: Normocephalic EYES: Conjunctivae clear, PERRL ENT: normal nose; no rhinorrhea; slightly dry mucous membranes; pharynx without lesions noted NECK: Supple, no meningismus, no LAD  CARD: RRR; S1 and S2 appreciated; no murmurs, no clicks, no rubs, no gallops RESP: Normal chest excursion without  splinting or tachypnea; breath sounds clear and equal bilaterally; no wheezes, no rhonchi, no rales,  ABD/GI: Normal bowel sounds; non-distended; soft, non-tender, no rebound, no guarding BACK:  The back appears normal and is non-tender to palpation, there is no CVA tenderness EXT: Normal ROM in all joints; non-tender to palpation; no edema; normal capillary refill; no cyanosis    SKIN: Normal color for age and race; warm NEURO: Moves all extremities equally PSYCH: The patient's mood and manner are appropriate. Grooming and personal hygiene are appropriate.  MEDICAL DECISION MAKING: Patient here with hypotension that was measured at home by  her son. Blood pressure in the ED and was EMS has been normotensive. She has no current complaints. We'll check basic labs, urine, chest x-ray to evaluate for possible causes of transient hypotension although suspect it was due to BP cuff and an experience of son using this cuff.  ED PROGRESS: Labs show mild hyponatremia. We'll give IV fluids. Will have patient followup with her primary care physician to have this rechecked. Patient also has urinary tract infection. Urine culture pending. Given ceftriaxone in the ED. We'll discharge home on Cefpodoxime. Chest x-ray says no infiltrate but does show a 5.2 cm thoracic aortic aneurysm. Patient's son is aware of this aneurysm and they have been followed by vascular surgeon in the past. Have recommended she followup with this doctor again seen. Given patient is not having any hypotension in the ED and has no chest pain or shortness of breath, back pain, and her creatinine is elevated at 2.28 which is chronic for her, I do not feel she needs a CT of her chest at this time. Discussed at length strict return precautions and importance of monitoring blood pressure and treating hypertension. Patient and son verbalize understanding and are comfortable this plan.   Results for orders placed during the hospital encounter of 04/17/13  CBC WITH DIFFERENTIAL      Result Value Range   WBC 5.0  4.0 - 10.5 K/uL   RBC 4.24  3.87 - 5.11 MIL/uL   Hemoglobin 12.6  12.0 - 15.0 g/dL   HCT 08.6  57.8 - 46.9 %   MCV 88.2  78.0 - 100.0 fL   MCH 29.7  26.0 - 34.0 pg   MCHC 33.7  30.0 - 36.0 g/dL   RDW 62.9  52.8 - 41.3 %   Platelets 207  150 - 400 K/uL   Neutrophils Relative % 53  43 - 77 %   Neutro Abs 2.6  1.7 - 7.7 K/uL   Lymphocytes Relative 34  12 - 46 %   Lymphs Abs 1.7  0.7 - 4.0 K/uL   Monocytes Relative 11  3 - 12 %   Monocytes Absolute 0.5  0.1 - 1.0 K/uL   Eosinophils Relative 2  0 - 5 %   Eosinophils Absolute 0.1  0.0 - 0.7 K/uL   Basophils Relative 0  0 - 1  %   Basophils Absolute 0.0  0.0 - 0.1 K/uL  COMPREHENSIVE METABOLIC PANEL      Result Value Range   Sodium 126 (*) 135 - 145 mEq/L   Potassium 3.3 (*) 3.5 - 5.1 mEq/L   Chloride 84 (*) 96 - 112 mEq/L   CO2 30  19 - 32 mEq/L   Glucose, Bld 91  70 - 99 mg/dL   BUN 26 (*) 6 - 23 mg/dL   Creatinine, Ser 2.44 (*) 0.50 - 1.10 mg/dL   Calcium 9.9  8.4 -  10.5 mg/dL   Total Protein 7.8  6.0 - 8.3 g/dL   Albumin 3.9  3.5 - 5.2 g/dL   AST 25  0 - 37 U/L   ALT 6  0 - 35 U/L   Alkaline Phosphatase 41  39 - 117 U/L   Total Bilirubin 0.5  0.3 - 1.2 mg/dL   GFR calc non Af Amer 20 (*) >90 mL/min   GFR calc Af Amer 23 (*) >90 mL/min  TROPONIN I      Result Value Range   Troponin I <0.30  <0.30 ng/mL  URINALYSIS, ROUTINE W REFLEX MICROSCOPIC      Result Value Range   Color, Urine YELLOW  YELLOW   APPearance HAZY (*) CLEAR   Specific Gravity, Urine 1.010  1.005 - 1.030   pH 7.0  5.0 - 8.0   Glucose, UA NEGATIVE  NEGATIVE mg/dL   Hgb urine dipstick TRACE (*) NEGATIVE   Bilirubin Urine NEGATIVE  NEGATIVE   Ketones, ur NEGATIVE  NEGATIVE mg/dL   Protein, ur NEGATIVE  NEGATIVE mg/dL   Urobilinogen, UA 0.2  0.0 - 1.0 mg/dL   Nitrite NEGATIVE  NEGATIVE   Leukocytes, UA LARGE (*) NEGATIVE  URINE MICROSCOPIC-ADD ON      Result Value Range   Squamous Epithelial / LPF MANY (*) RARE   WBC, UA 11-20  <3 WBC/hpf   RBC / HPF 3-6  <3 RBC/hpf   Bacteria, UA FEW (*) RARE       Brandy Maw Edmar Blankenburg, DO 04/17/13 1508

## 2013-04-18 LAB — URINE CULTURE

## 2013-05-02 ENCOUNTER — Other Ambulatory Visit: Payer: Self-pay | Admitting: *Deleted

## 2013-05-02 MED ORDER — METOPROLOL TARTRATE 25 MG PO TABS: 12.5000 mg | ORAL_TABLET | Freq: Two times a day (BID) | ORAL | Status: DC

## 2013-05-02 NOTE — Telephone Encounter (Signed)
Rx was sent to pharmacy electronically. 

## 2013-05-09 ENCOUNTER — Other Ambulatory Visit: Payer: Self-pay

## 2013-05-09 MED ORDER — FUROSEMIDE 40 MG PO TABS: 40.0000 mg | ORAL_TABLET | Freq: Every day | ORAL | Status: DC

## 2013-05-09 NOTE — Telephone Encounter (Signed)
Rx was sent to pharmacy electronically. 

## 2013-08-01 ENCOUNTER — Telehealth: Payer: Self-pay | Admitting: Pharmacist Clinician (PhC)/ Clinical Pharmacy Specialist

## 2013-08-02 MED ORDER — ATORVASTATIN CALCIUM 40 MG PO TABS: 40.0000 mg | ORAL_TABLET | Freq: Every day | ORAL | Status: DC

## 2013-08-02 NOTE — Telephone Encounter (Signed)
Switched rx from Crestor 20 to atorvastatin 40, crestor not covered in 2015.  Tried multiple times to reach patient, no answering machine or VM setup.  Will leave message with pharmacy to inform patient of change in med

## 2013-08-28 ENCOUNTER — Telehealth: Payer: Self-pay | Admitting: Neurology

## 2013-08-28 NOTE — Telephone Encounter (Signed)
Called patient spoke with her son to reschedule her appointment for tomorrow, son states he will call back and reschedule.

## 2013-08-29 ENCOUNTER — Ambulatory Visit: Payer: Medicare Other | Admitting: Neurology

## 2013-09-11 ENCOUNTER — Other Ambulatory Visit: Payer: Self-pay | Admitting: *Deleted

## 2013-09-11 MED ORDER — AMLODIPINE BESYLATE 5 MG PO TABS: 5.0000 mg | ORAL_TABLET | Freq: Every day | ORAL | Status: DC

## 2013-11-27 ENCOUNTER — Encounter (HOSPITAL_COMMUNITY): Payer: Self-pay | Admitting: Emergency Medicine

## 2013-11-27 ENCOUNTER — Emergency Department (HOSPITAL_COMMUNITY): Payer: Medicare Other

## 2013-11-27 ENCOUNTER — Emergency Department (HOSPITAL_COMMUNITY)
Admission: EM | Admit: 2013-11-27 | Discharge: 2013-11-28 | Disposition: A | Discharge status: Home / Self Care | Payer: Medicare Other | Attending: Emergency Medicine | Admitting: Emergency Medicine

## 2013-11-27 DIAGNOSIS — Z9861 Coronary angioplasty status: Secondary | ICD-10-CM | POA: Insufficient documentation

## 2013-11-27 DIAGNOSIS — I10 Essential (primary) hypertension: Secondary | ICD-10-CM | POA: Insufficient documentation

## 2013-11-27 DIAGNOSIS — Z9981 Dependence on supplemental oxygen: Secondary | ICD-10-CM | POA: Insufficient documentation

## 2013-11-27 DIAGNOSIS — Z87891 Personal history of nicotine dependence: Secondary | ICD-10-CM | POA: Insufficient documentation

## 2013-11-27 DIAGNOSIS — F17200 Nicotine dependence, unspecified, uncomplicated: Secondary | ICD-10-CM | POA: Insufficient documentation

## 2013-11-27 DIAGNOSIS — J441 Chronic obstructive pulmonary disease with (acute) exacerbation: Secondary | ICD-10-CM | POA: Insufficient documentation

## 2013-11-27 DIAGNOSIS — Z8673 Personal history of transient ischemic attack (TIA), and cerebral infarction without residual deficits: Secondary | ICD-10-CM | POA: Insufficient documentation

## 2013-11-27 DIAGNOSIS — I252 Old myocardial infarction: Secondary | ICD-10-CM | POA: Insufficient documentation

## 2013-11-27 DIAGNOSIS — I509 Heart failure, unspecified: Secondary | ICD-10-CM | POA: Insufficient documentation

## 2013-11-27 DIAGNOSIS — Z79899 Other long term (current) drug therapy: Secondary | ICD-10-CM | POA: Insufficient documentation

## 2013-11-27 DIAGNOSIS — Z7902 Long term (current) use of antithrombotics/antiplatelets: Secondary | ICD-10-CM | POA: Insufficient documentation

## 2013-11-27 DIAGNOSIS — R609 Edema, unspecified: Secondary | ICD-10-CM | POA: Insufficient documentation

## 2013-11-27 LAB — I-STAT TROPONIN, ED: Troponin i, poc: 0.02 ng/mL (ref 0.00–0.08)

## 2013-11-27 LAB — CBC
HCT: 36.9 % (ref 36.0–46.0)
HEMOGLOBIN: 12 g/dL (ref 12.0–15.0)
MCH: 29.3 pg (ref 26.0–34.0)
MCHC: 32.5 g/dL (ref 30.0–36.0)
MCV: 90 fL (ref 78.0–100.0)
PLATELETS: 227 10*3/uL (ref 150–400)
RBC: 4.1 MIL/uL (ref 3.87–5.11)
RDW: 15.4 % (ref 11.5–15.5)
WBC: 7.2 10*3/uL (ref 4.0–10.5)

## 2013-11-27 LAB — PRO B NATRIURETIC PEPTIDE: Pro B Natriuretic peptide (BNP): 4215 pg/mL — ABNORMAL HIGH (ref 0–450)

## 2013-11-27 LAB — BASIC METABOLIC PANEL
BUN: 25 mg/dL — AB (ref 6–23)
CALCIUM: 8.9 mg/dL (ref 8.4–10.5)
CO2: 26 meq/L (ref 19–32)
Chloride: 92 mEq/L — ABNORMAL LOW (ref 96–112)
Creatinine, Ser: 1.62 mg/dL — ABNORMAL HIGH (ref 0.50–1.10)
GFR calc Af Amer: 34 mL/min — ABNORMAL LOW (ref >90)
GFR calc non Af Amer: 29 mL/min — ABNORMAL LOW (ref >90)
GLUCOSE: 122 mg/dL — AB (ref 70–99)
Potassium: 4.4 mEq/L (ref 3.7–5.3)
SODIUM: 131 meq/L — AB (ref 137–147)

## 2013-11-27 MED ORDER — ALBUTEROL SULFATE (2.5 MG/3ML) 0.083% IN NEBU: 5.0000 mg | INHALATION_SOLUTION | Freq: Once | RESPIRATORY_TRACT | Status: DC

## 2013-11-27 MED ORDER — AMLODIPINE BESYLATE 5 MG PO TABS
10.0000 mg | ORAL_TABLET | Freq: Once | ORAL | Status: AC
  Administered 2013-11-28: 10 mg via ORAL
  Filled 2013-11-27: qty 2

## 2013-11-27 MED ORDER — METOPROLOL TARTRATE 1 MG/ML IV SOLN
5.0000 mg | Freq: Once | INTRAVENOUS | Status: AC
  Administered 2013-11-28: 5 mg via INTRAVENOUS
  Filled 2013-11-27: qty 5

## 2013-11-27 MED ORDER — IPRATROPIUM BROMIDE 0.02 % IN SOLN: 0.5000 mg | RESPIRATORY_TRACT | Status: DC

## 2013-11-27 MED ORDER — IPRATROPIUM-ALBUTEROL 0.5-2.5 (3) MG/3ML IN SOLN
3.0000 mL | Freq: Once | RESPIRATORY_TRACT | Status: AC
  Administered 2013-11-27: 3 mL via RESPIRATORY_TRACT
  Filled 2013-11-27: qty 3

## 2013-11-27 MED ORDER — ALBUTEROL SULFATE (2.5 MG/3ML) 0.083% IN NEBU
2.5000 mg | INHALATION_SOLUTION | Freq: Once | RESPIRATORY_TRACT | Status: AC
  Administered 2013-11-27: 2.5 mg via RESPIRATORY_TRACT
  Filled 2013-11-27: qty 3

## 2013-11-27 NOTE — ED Notes (Signed)
Pt on cardiac monitor, BP monitor, and pulse ox.

## 2013-11-27 NOTE — ED Notes (Signed)
Patient assisted to bedside commode. Patient very anxious. Keep asking for her son. Patient has unsteady gait.

## 2013-11-27 NOTE — ED Provider Notes (Signed)
CSN: [id number]     Arrival date & time 11/27/13  2155 History   First MD Initiated Contact with Patient 11/27/13 2307     Chief Complaint  Patient presents with  . Shortness of Breath     (Consider location/radiation/quality/duration/timing/severity/associated sxs/prior Treatment) HPI Comments: According to paramedics, the pt has been complaining of several days of mild SOB - she was noted to be hypertensive tonight and was thus brought to the hospital.  When asked, the pt denies SOB and denies pain but states that her family was concerned about her BP.  She is supposed to be on chronic O2 therapy according to prior d/c summaries however when asked, she states I don't use O2 at home and I'm not supposed to.  Her EMR shows taht she has both COPD and CHF though her last echo from 2011 showed 55-60% EF.  She denies fluid retention.  Family is not here, the hospital is on lockdown due to [place] patient on my arrival and no family has been able to enter the hospital.  History is limited as this patient is denying history obtained by EMS.  Pt states they brought her by EMS prior to taking all of her night time meds.  Baseline ambulation per pt is with walker at all times  Patient is a 80 y.o. female presenting with shortness of breath. The history is provided by the patient and the EMS personnel.  Shortness of Breath   Past Medical History  Diagnosis Date  . Stroke   . MI (myocardial infarction)   . Hypertension   . Bite from cat 03/17/2011  . CHF (congestive heart failure)    Past Surgical History  Procedure Laterality Date  . Stents       kidneys and 2 in heart  . Abdominal hysterectomy     Family History  Problem Relation Age of Onset  . Cancer Mother   . Cancer Sister   . COPD Sister   . Heart failure Sister    History  Substance Use Topics  . Smoking status: Former Smoker -- 1.00 packs/day for 50 years    Types: Cigarettes    Quit date: 04/03/2001  . Smokeless tobacco: Never  Used  . Alcohol Use: No   OB History   Grav Para Term Preterm Abortions TAB SAB Ect Mult Living   7 7 7       5      Review of Systems  Respiratory: Positive for shortness of breath.   All other systems reviewed and are negative.     Allergies  Review of patient's allergies indicates no known allergies.  Home Medications   Prior to Admission medications   Medication Sig Start Date End Date Taking? Authorizing Provider  amLODipine (NORVASC) 5 MG tablet Take 5 mg by mouth at bedtime.   Yes Historical Provider, MD  atorvastatin (LIPITOR) 40 MG tablet Take 40 mg by mouth at bedtime.   Yes Historical Provider, MD  bisacodyl (DULCOLAX) 5 MG EC tablet Take 10 mg by mouth daily as needed for mild constipation or moderate constipation.   Yes Historical Provider, MD  clopidogrel (PLAVIX) 75 MG tablet Take 75 mg by mouth at bedtime.    Yes Historical Provider, MD  furosemide (LASIX) 40 MG tablet Take 1 tablet (40 mg total) by mouth daily. 05/09/13  Yes Lorretta Harp, MD  furosemide (LASIX) 40 MG tablet Take 40 mg by mouth at bedtime.   Yes Historical Provider, MD  levothyroxine (SYNTHROID,  LEVOTHROID) 25 MCG tablet Take 25 mcg by mouth at bedtime.    Yes Historical Provider, MD  metoprolol tartrate (LOPRESSOR) 25 MG tablet Take 0.5 tablets (12.5 mg total) by mouth 2 (two) times daily. 05/02/13  Yes Lorretta Harp, MD  traZODone (DESYREL) 50 MG tablet Take 50 mg by mouth at bedtime.      Historical Provider, MD   BP 148/93  Pulse 97  Temp(Src) 98.2 F (36.8 C) (Oral)  Resp 18  Wt 170 lb (77.111 kg)  SpO2 97% Physical Exam  Nursing note and vitals reviewed. Constitutional: She appears well-developed and well-nourished.  HENT:  Head: Normocephalic and atraumatic.  Mouth/Throat: Oropharynx is clear and moist. No oropharyngeal exudate.  Eyes: Conjunctivae and EOM are normal. Pupils are equal, round, and reactive to light. Right eye exhibits no discharge. Left eye exhibits no  discharge. No scleral icterus.  Neck: Normal range of motion. Neck supple. No JVD present. No thyromegaly present.  Cardiovascular: Normal rate, normal heart sounds and intact distal pulses.  Exam reveals no gallop and no friction rub.   No murmur heard. Irregularly irregular rate, normal pulses at radial arteries.  Pulmonary/Chest: Effort normal and breath sounds normal. No respiratory distress. She has no wheezes. She has no rales.  Scattered rales, scattered wheezes, prolonged expiratory phase, mild tachypnea, speaks in full sentences.  Abdominal: Soft. Bowel sounds are normal. She exhibits no distension and no mass. There is no tenderness.  Musculoskeletal: Normal range of motion. She exhibits edema ( bilateral mild LE edema L>R). She exhibits no tenderness.  Lymphadenopathy:    She has no cervical adenopathy.  Neurological: She is alert. Coordination normal.  Speech is clear, movements are coordinated, follows commands, normal strength of all 4 extremities.  Skin: Skin is warm and dry. No rash noted. No erythema.  Psychiatric: She has a normal mood and affect. Her behavior is normal.    ED Course  Procedures (including critical care time) Labs Review Labs Reviewed  BASIC METABOLIC PANEL - Abnormal; Notable for the following:    Sodium 131 (*)    Chloride 92 (*)    Glucose, Bld 122 (*)    BUN 25 (*)    Creatinine, Ser 1.62 (*)    GFR calc non Af Amer 29 (*)    GFR calc Af Amer 34 (*)    All other components within normal limits  PRO B NATRIURETIC PEPTIDE - Abnormal; Notable for the following:    Pro B Natriuretic peptide (BNP) 4215.0 (*)    All other components within normal limits  CBC  I-STAT TROPOININ, ED    Imaging Review Dg Chest Port 1 View  11/27/2013   CLINICAL DATA:  Shortness of breath  EXAM: PORTABLE CHEST - 1 VIEW  COMPARISON:  04/17/2013  FINDINGS: Mild cardiomegaly which is unchanged. There is marked tortuosity of the thoracic aorta with a known distal  descending thoracic aortic aneurysm. There is no acute contour change of the mediastinum.  No edema, consolidation, effusion, or pneumothorax.  IMPRESSION: 1.  No active disease. 2. Chronic aortic tortuosity with known distal thoracic aneurysm.   Electronically Signed   By: Jorje Guild M.D.   On: 11/27/2013 22:39     EKG Interpretation   Date/Time:  Tuesday November 27 2013 22:05:21 EDT Ventricular Rate:  87 PR Interval:  186 QRS Duration: 132 QT Interval:  379 QTC Calculation: 456 R Axis:   -79 Text Interpretation:  Atrial fibrillation RBBB and LAFB Inferior infarct,  old  Baseline wander in lead(s) II V3 Abnormal ekg since last tracing no  significant change Confirmed by Clayvon Parlett  MD, [place] ([id number]) on 11/27/2013  11:07:51 PM      MDM   Final diagnoses:  CHF (congestive heart failure)    Pt has abnormal VS - she has BP of 190/135, sat's of 95% on room air and no fever or tachycardia.  Her prior ECG showed afib which she is currently in as well.  She is on norvasc, plavix and lasix and lopressor.  Will give dose of lopressor and norvasc and albuterol.  CXR without acute findings, ECG without acute changes,  Trop neg, CBC normal  BNP > 4200, lasix ordered.  Current BP is 180 / 130.  D/w Dr. Ernesto Rutherford who will see pt in ED    Dr. Ernesto Rutherford has evaluated pt and on reevluation after lasix and BP meds, the pt has normal breathing pattern, normal oxygenation and given very little peripheral or pulmonary edema, she can safely be d/c.  Her BP is much improved on d/c.  In agreement to d/c pt home.  Meds given in ED:  Medications  ipratropium-albuterol (DUONEB) 0.5-2.5 (3) MG/3ML nebulizer solution 3 mL (3 mLs Nebulization Given 11/27/13 2320)  albuterol (PROVENTIL) (2.5 MG/3ML) 0.083% nebulizer solution 2.5 mg (2.5 mg Nebulization Given 11/27/13 2321)  amLODipine (NORVASC) tablet 10 mg (10 mg Oral Given 11/28/13 0001)  metoprolol (LOPRESSOR) injection 5 mg (5 mg Intravenous Given 11/28/13 0001)   furosemide (LASIX) injection 40 mg (40 mg Intravenous Given 11/28/13 0026)    New Prescriptions   No medications on file      Johnna Acosta, MD 11/28/13 0113

## 2013-11-28 MED ORDER — FUROSEMIDE 10 MG/ML IJ SOLN
40.0000 mg | INTRAMUSCULAR | Status: AC
  Administered 2013-11-28: 40 mg via INTRAVENOUS
  Filled 2013-11-28: qty 4

## 2013-11-28 NOTE — Discharge Instructions (Signed)
Your blood pressure has come back to a more normal range, your xray shows no infection and no fluid on your lungs.  You should take an extra dose of lasix tomorrow IN ADDITION to your normal dosing and call in the morning for Dr. Gwenlyn Found follow up appointment.  Please call your doctor for a followup appointment within 24-48 hours. When you talk to your doctor please let them know that you were seen in the emergency department and have them acquire all of your records so that they can discuss the findings with you and formulate a treatment plan to fully care for your new and ongoing problems.

## 2013-11-28 NOTE — ED Notes (Signed)
Family called and notified that pt is up for discharge.

## 2013-12-27 ENCOUNTER — Other Ambulatory Visit: Payer: Self-pay | Admitting: Cardiovascular Disease

## 2013-12-27 NOTE — Telephone Encounter (Signed)
Rx refill sent to patient pharmacy   

## 2014-01-28 ENCOUNTER — Other Ambulatory Visit: Payer: Self-pay | Admitting: *Deleted

## 2014-01-28 MED ORDER — METOPROLOL TARTRATE 25 MG PO TABS
12.5000 mg | ORAL_TABLET | Freq: Two times a day (BID) | ORAL | Status: DC
Start: 2014-01-28 — End: 2014-09-28

## 2014-02-27 ENCOUNTER — Other Ambulatory Visit: Payer: Self-pay | Admitting: *Deleted

## 2014-02-27 MED ORDER — FUROSEMIDE 40 MG PO TABS: 40.0000 mg | ORAL_TABLET | Freq: Every day | ORAL | Status: DC

## 2014-02-27 NOTE — Telephone Encounter (Signed)
Rx refill sent to patient pharmacy   

## 2014-04-29 ENCOUNTER — Encounter (HOSPITAL_COMMUNITY): Payer: Self-pay | Admitting: Emergency Medicine

## 2014-09-28 ENCOUNTER — Inpatient Hospital Stay (HOSPITAL_COMMUNITY): Payer: Medicare Other

## 2014-09-28 ENCOUNTER — Emergency Department (HOSPITAL_COMMUNITY): Payer: Medicare Other

## 2014-09-28 ENCOUNTER — Inpatient Hospital Stay (HOSPITAL_COMMUNITY)
Admission: EM | Admit: 2014-09-28 | Discharge: 2014-10-03 | DRG: 872 | Disposition: A | Discharge status: Skilled Nursing Facility | Payer: Medicare Other | Attending: Internal Medicine | Admitting: Internal Medicine

## 2014-09-28 ENCOUNTER — Encounter (HOSPITAL_COMMUNITY): Payer: Self-pay

## 2014-09-28 DIAGNOSIS — Y92099 Unspecified place in other non-institutional residence as the place of occurrence of the external cause: Secondary | ICD-10-CM

## 2014-09-28 DIAGNOSIS — R4189 Other symptoms and signs involving cognitive functions and awareness: Secondary | ICD-10-CM | POA: Diagnosis not present

## 2014-09-28 DIAGNOSIS — N390 Urinary tract infection, site not specified: Secondary | ICD-10-CM | POA: Diagnosis present

## 2014-09-28 DIAGNOSIS — E860 Dehydration: Secondary | ICD-10-CM

## 2014-09-28 DIAGNOSIS — E039 Hypothyroidism, unspecified: Secondary | ICD-10-CM | POA: Diagnosis present

## 2014-09-28 DIAGNOSIS — I959 Hypotension, unspecified: Secondary | ICD-10-CM

## 2014-09-28 DIAGNOSIS — E861 Hypovolemia: Secondary | ICD-10-CM | POA: Diagnosis present

## 2014-09-28 DIAGNOSIS — D62 Acute posthemorrhagic anemia: Secondary | ICD-10-CM | POA: Diagnosis present

## 2014-09-28 DIAGNOSIS — F0391 Unspecified dementia with behavioral disturbance: Secondary | ICD-10-CM | POA: Diagnosis present

## 2014-09-28 DIAGNOSIS — A419 Sepsis, unspecified organism: Principal | ICD-10-CM | POA: Diagnosis present

## 2014-09-28 DIAGNOSIS — E869 Volume depletion, unspecified: Secondary | ICD-10-CM | POA: Diagnosis not present

## 2014-09-28 DIAGNOSIS — I129 Hypertensive chronic kidney disease with stage 1 through stage 4 chronic kidney disease, or unspecified chronic kidney disease: Secondary | ICD-10-CM | POA: Diagnosis present

## 2014-09-28 DIAGNOSIS — I716 Thoracoabdominal aortic aneurysm, without rupture, unspecified: Secondary | ICD-10-CM | POA: Diagnosis present

## 2014-09-28 DIAGNOSIS — R0902 Hypoxemia: Secondary | ICD-10-CM | POA: Diagnosis present

## 2014-09-28 DIAGNOSIS — I4891 Unspecified atrial fibrillation: Secondary | ICD-10-CM

## 2014-09-28 DIAGNOSIS — T148XXA Other injury of unspecified body region, initial encounter: Secondary | ICD-10-CM

## 2014-09-28 DIAGNOSIS — I509 Heart failure, unspecified: Secondary | ICD-10-CM | POA: Diagnosis present

## 2014-09-28 DIAGNOSIS — I482 Chronic atrial fibrillation: Secondary | ICD-10-CM | POA: Diagnosis present

## 2014-09-28 DIAGNOSIS — Z90710 Acquired absence of both cervix and uterus: Secondary | ICD-10-CM

## 2014-09-28 DIAGNOSIS — S300XXA Contusion of lower back and pelvis, initial encounter: Secondary | ICD-10-CM | POA: Diagnosis present

## 2014-09-28 DIAGNOSIS — R4181 Age-related cognitive decline: Secondary | ICD-10-CM | POA: Diagnosis present

## 2014-09-28 DIAGNOSIS — I480 Paroxysmal atrial fibrillation: Secondary | ICD-10-CM | POA: Diagnosis present

## 2014-09-28 DIAGNOSIS — R339 Retention of urine, unspecified: Secondary | ICD-10-CM | POA: Diagnosis present

## 2014-09-28 DIAGNOSIS — W19XXXA Unspecified fall, initial encounter: Secondary | ICD-10-CM | POA: Diagnosis present

## 2014-09-28 DIAGNOSIS — N183 Chronic kidney disease, stage 3 unspecified: Secondary | ICD-10-CM

## 2014-09-28 DIAGNOSIS — R062 Wheezing: Secondary | ICD-10-CM

## 2014-09-28 DIAGNOSIS — Z7902 Long term (current) use of antithrombotics/antiplatelets: Secondary | ICD-10-CM

## 2014-09-28 DIAGNOSIS — E872 Acidosis, unspecified: Secondary | ICD-10-CM | POA: Diagnosis present

## 2014-09-28 DIAGNOSIS — N179 Acute kidney failure, unspecified: Secondary | ICD-10-CM | POA: Diagnosis present

## 2014-09-28 DIAGNOSIS — D696 Thrombocytopenia, unspecified: Secondary | ICD-10-CM | POA: Diagnosis present

## 2014-09-28 DIAGNOSIS — F03918 Unspecified dementia, unspecified severity, with other behavioral disturbance: Secondary | ICD-10-CM | POA: Diagnosis present

## 2014-09-28 DIAGNOSIS — Z87891 Personal history of nicotine dependence: Secondary | ICD-10-CM | POA: Diagnosis not present

## 2014-09-28 DIAGNOSIS — F09 Unspecified mental disorder due to known physiological condition: Secondary | ICD-10-CM | POA: Diagnosis present

## 2014-09-28 DIAGNOSIS — R410 Disorientation, unspecified: Secondary | ICD-10-CM

## 2014-09-28 DIAGNOSIS — I252 Old myocardial infarction: Secondary | ICD-10-CM

## 2014-09-28 DIAGNOSIS — I2510 Atherosclerotic heart disease of native coronary artery without angina pectoris: Secondary | ICD-10-CM | POA: Diagnosis present

## 2014-09-28 DIAGNOSIS — Z8673 Personal history of transient ischemic attack (TIA), and cerebral infarction without residual deficits: Secondary | ICD-10-CM | POA: Diagnosis not present

## 2014-09-28 DIAGNOSIS — R0989 Other specified symptoms and signs involving the circulatory and respiratory systems: Secondary | ICD-10-CM

## 2014-09-28 HISTORY — DX: Chronic kidney disease, stage 3 (moderate): N18.3

## 2014-09-28 HISTORY — DX: Hypothyroidism, unspecified: E03.9

## 2014-09-28 HISTORY — DX: Chronic kidney disease, stage 3 unspecified: N18.30

## 2014-09-28 HISTORY — DX: Unspecified dementia, unspecified severity, without behavioral disturbance, psychotic disturbance, mood disturbance, and anxiety: F03.90

## 2014-09-28 HISTORY — DX: Contusion of lower back and pelvis, initial encounter: S30.0XXA

## 2014-09-28 HISTORY — DX: Paroxysmal atrial fibrillation: I48.0

## 2014-09-28 HISTORY — DX: Thoracoabdominal aortic aneurysm, without rupture: I71.6

## 2014-09-28 HISTORY — DX: Atherosclerotic heart disease of native coronary artery without angina pectoris: I25.10

## 2014-09-28 LAB — CBC WITH DIFFERENTIAL/PLATELET
Basophils Absolute: 0 10*3/uL (ref 0.0–0.1)
Basophils Relative: 0 % (ref 0–1)
EOS ABS: 0 10*3/uL (ref 0.0–0.7)
Eosinophils Relative: 0 % (ref 0–5)
HCT: 35.1 % — ABNORMAL LOW (ref 36.0–46.0)
Hemoglobin: 11.3 g/dL — ABNORMAL LOW (ref 12.0–15.0)
LYMPHS ABS: 1.4 10*3/uL (ref 0.7–4.0)
Lymphocytes Relative: 16 % (ref 12–46)
MCH: 30.9 pg (ref 26.0–34.0)
MCHC: 32.2 g/dL (ref 30.0–36.0)
MCV: 95.9 fL (ref 78.0–100.0)
MONOS PCT: 9 % (ref 3–12)
Monocytes Absolute: 0.8 10*3/uL (ref 0.1–1.0)
NEUTROS ABS: 6.7 10*3/uL (ref 1.7–7.7)
Neutrophils Relative %: 75 % (ref 43–77)
PLATELETS: 150 10*3/uL (ref 150–400)
RBC: 3.66 MIL/uL — AB (ref 3.87–5.11)
RDW: 15.6 % — AB (ref 11.5–15.5)
WBC: 9 10*3/uL (ref 4.0–10.5)

## 2014-09-28 LAB — URINALYSIS, ROUTINE W REFLEX MICROSCOPIC
Bilirubin Urine: NEGATIVE
Bilirubin Urine: NEGATIVE
Glucose, UA: NEGATIVE mg/dL
Glucose, UA: NEGATIVE mg/dL
Ketones, ur: NEGATIVE mg/dL
Ketones, ur: NEGATIVE mg/dL
LEUKOCYTES UA: NEGATIVE
Leukocytes, UA: NEGATIVE
Nitrite: NEGATIVE
Nitrite: NEGATIVE
PH: 7 (ref 5.0–8.0)
SPECIFIC GRAVITY, URINE: 1.01 (ref 1.005–1.030)
Specific Gravity, Urine: 1.01 (ref 1.005–1.030)
Urobilinogen, UA: 0.2 mg/dL (ref 0.0–1.0)
Urobilinogen, UA: 0.2 mg/dL (ref 0.0–1.0)
pH: 7 (ref 5.0–8.0)

## 2014-09-28 LAB — CBC
HEMATOCRIT: 30.4 % — AB (ref 36.0–46.0)
HEMOGLOBIN: 9.8 g/dL — AB (ref 12.0–15.0)
MCH: 30.9 pg (ref 26.0–34.0)
MCHC: 32.2 g/dL (ref 30.0–36.0)
MCV: 95.9 fL (ref 78.0–100.0)
PLATELETS: 135 10*3/uL — AB (ref 150–400)
RBC: 3.17 MIL/uL — ABNORMAL LOW (ref 3.87–5.11)
RDW: 15.7 % — ABNORMAL HIGH (ref 11.5–15.5)
WBC: 8.6 10*3/uL (ref 4.0–10.5)

## 2014-09-28 LAB — LACTIC ACID, PLASMA
LACTIC ACID, VENOUS: 1.3 mmol/L (ref 0.5–2.0)
LACTIC ACID, VENOUS: 1.6 mmol/L (ref 0.5–2.0)

## 2014-09-28 LAB — I-STAT TROPONIN, ED: Troponin i, poc: 0.03 ng/mL (ref 0.00–0.08)

## 2014-09-28 LAB — URINE MICROSCOPIC-ADD ON

## 2014-09-28 LAB — BASIC METABOLIC PANEL
Anion gap: 10 (ref 5–15)
BUN: 20 mg/dL (ref 6–23)
CO2: 27 mmol/L (ref 19–32)
CREATININE: 2.32 mg/dL — AB (ref 0.50–1.10)
Calcium: 8.6 mg/dL (ref 8.4–10.5)
Chloride: 97 mmol/L (ref 96–112)
GFR calc non Af Amer: 19 mL/min — ABNORMAL LOW (ref >90)
GFR, EST AFRICAN AMERICAN: 22 mL/min — AB (ref >90)
GLUCOSE: 96 mg/dL (ref 70–99)
Potassium: 4.1 mmol/L (ref 3.5–5.1)
SODIUM: 134 mmol/L — AB (ref 135–145)

## 2014-09-28 LAB — CREATININE, SERUM
CREATININE: 2.22 mg/dL — AB (ref 0.50–1.10)
GFR calc Af Amer: 23 mL/min — ABNORMAL LOW (ref >90)
GFR, EST NON AFRICAN AMERICAN: 20 mL/min — AB (ref >90)

## 2014-09-28 LAB — PROCALCITONIN: Procalcitonin: <0.1 ng/mL

## 2014-09-28 LAB — TSH: TSH: 4.34 u[IU]/mL (ref 0.350–4.500)

## 2014-09-28 LAB — I-STAT CG4 LACTIC ACID, ED: LACTIC ACID, VENOUS: 4.08 mmol/L — AB (ref 0.5–2.0)

## 2014-09-28 LAB — CK: CK TOTAL: 188 U/L — AB (ref 7–177)

## 2014-09-28 LAB — MRSA PCR SCREENING: MRSA BY PCR: NEGATIVE

## 2014-09-28 MED ORDER — SODIUM CHLORIDE 0.9 % IV BOLUS (SEPSIS)
500.0000 mL | INTRAVENOUS | Status: AC
  Administered 2014-09-28: 500 mL via INTRAVENOUS

## 2014-09-28 MED ORDER — SODIUM CHLORIDE 0.9 % IV BOLUS (SEPSIS)
1000.0000 mL | INTRAVENOUS | Status: AC
  Administered 2014-09-28 (×2): 1000 mL via INTRAVENOUS

## 2014-09-28 MED ORDER — PIPERACILLIN-TAZOBACTAM 3.375 G IVPB
3.3750 g | Freq: Three times a day (TID) | INTRAVENOUS | Status: DC
  Administered 2014-09-28 – 2014-10-01 (×9): 3.375 g via INTRAVENOUS
  Filled 2014-09-28 (×13): qty 50

## 2014-09-28 MED ORDER — ONDANSETRON HCL 4 MG PO TABS: 4.0000 mg | ORAL_TABLET | Freq: Four times a day (QID) | ORAL | Status: DC | PRN

## 2014-09-28 MED ORDER — SODIUM CHLORIDE 0.9 % IV SOLN
INTRAVENOUS | Status: DC
  Administered 2014-09-28 – 2014-10-01 (×6): via INTRAVENOUS

## 2014-09-28 MED ORDER — VANCOMYCIN HCL IN DEXTROSE 1-5 GM/200ML-% IV SOLN
INTRAVENOUS | Status: AC
  Filled 2014-09-28: qty 200

## 2014-09-28 MED ORDER — HEPARIN SODIUM (PORCINE) 5000 UNIT/ML IJ SOLN
5000.0000 [IU] | Freq: Three times a day (TID) | INTRAMUSCULAR | Status: DC
  Administered 2014-09-28: 5000 [IU] via SUBCUTANEOUS
  Filled 2014-09-28: qty 1

## 2014-09-28 MED ORDER — LEVOTHYROXINE SODIUM 25 MCG PO TABS
25.0000 ug | ORAL_TABLET | Freq: Every day | ORAL | Status: DC
  Administered 2014-09-28: 25 ug via ORAL
  Filled 2014-09-28: qty 1

## 2014-09-28 MED ORDER — SODIUM CHLORIDE 0.9 % IJ SOLN
3.0000 mL | Freq: Two times a day (BID) | INTRAMUSCULAR | Status: DC
  Administered 2014-09-28 – 2014-10-03 (×4): 3 mL via INTRAVENOUS

## 2014-09-28 MED ORDER — VANCOMYCIN HCL IN DEXTROSE 1-5 GM/200ML-% IV SOLN
1000.0000 mg | Freq: Once | INTRAVENOUS | Status: AC
  Administered 2014-09-28: 1000 mg via INTRAVENOUS
  Filled 2014-09-28: qty 200

## 2014-09-28 MED ORDER — VANCOMYCIN HCL IN DEXTROSE 1-5 GM/200ML-% IV SOLN
1000.0000 mg | INTRAVENOUS | Status: DC
  Administered 2014-09-29 – 2014-09-30 (×2): 1000 mg via INTRAVENOUS
  Filled 2014-09-28 (×3): qty 200

## 2014-09-28 MED ORDER — PIPERACILLIN-TAZOBACTAM 3.375 G IVPB 30 MIN
3.3750 g | Freq: Once | INTRAVENOUS | Status: AC
  Administered 2014-09-28: 3.375 g via INTRAVENOUS
  Filled 2014-09-28: qty 50

## 2014-09-28 MED ORDER — ONDANSETRON HCL 4 MG/2ML IJ SOLN: 4.0000 mg | Freq: Four times a day (QID) | INTRAMUSCULAR | Status: DC | PRN

## 2014-09-28 MED ORDER — CLOPIDOGREL BISULFATE 75 MG PO TABS
75.0000 mg | ORAL_TABLET | Freq: Every day | ORAL | Status: DC
  Administered 2014-09-28: 75 mg via ORAL
  Filled 2014-09-28: qty 1

## 2014-09-28 MED ORDER — DEXTROSE 5 % IV SOLN
2.0000 g | Freq: Once | INTRAVENOUS | Status: AC
  Administered 2014-09-28: 2 g via INTRAVENOUS
  Filled 2014-09-28: qty 2

## 2014-09-28 MED ORDER — CETYLPYRIDINIUM CHLORIDE 0.05 % MT LIQD
7.0000 mL | Freq: Two times a day (BID) | OROMUCOSAL | Status: DC
  Administered 2014-09-28 – 2014-09-30 (×5): 7 mL via OROMUCOSAL

## 2014-09-28 NOTE — ED Provider Notes (Signed)
CSN: [id number]     Arrival date & time 09/28/14  0229 History   First MD Initiated Contact with Patient 09/28/14 [phone]     Chief Complaint  Patient presents with  . Fall   LEVEL 5 CAVEAT DUE TO DEMENTIA  Patient is a 81 y.o. female presenting with fall. The history is provided by the patient, a relative and the EMS personnel (Son at bedside).  Fall This is a new problem. The current episode started 1 to 2 hours ago. The problem occurs constantly. The problem has not changed since onset.Pertinent negatives include no chest pain, no abdominal pain and no headaches. The symptoms are aggravated by walking. Nothing relieves the symptoms.  Patient presents from home s/p fall Per son, pt attempted to ambulate without walker (she must use walker to ambulate) She fell and yelled out for son This occurred within the past 2 hrs At this time, pt only reports neck pain and no other complaints  Per Son, pt has h/o dementia at baseline. However over past 24 hours she has been more confused, calling out for family that is not present.  He reports this is similar to prior episodes of UTI    Past Medical History  Diagnosis Date  . Stroke   . MI (myocardial infarction)   . Hypertension   . Bite from cat 03/17/2011  . CHF (congestive heart failure)    Past Surgical History  Procedure Laterality Date  . Stents       kidneys and 2 in heart  . Abdominal hysterectomy     Family History  Problem Relation Age of Onset  . Cancer Mother   . Cancer Sister   . COPD Sister   . Heart failure Sister    History  Substance Use Topics  . Smoking status: Former Smoker -- 1.00 packs/day for 50 years    Types: Cigarettes    Quit date: 04/03/2001  . Smokeless tobacco: Never Used  . Alcohol Use: No   OB History    Gravida Para Term Preterm AB TAB SAB Ectopic Multiple Living   7 7 7       5      Review of Systems  Unable to perform ROS: Dementia  Cardiovascular: Negative for chest pain.   Gastrointestinal: Negative for abdominal pain.  Neurological: Negative for headaches.      Allergies  Review of patient's allergies indicates no known allergies.  Home Medications   Prior to Admission medications   Medication Sig Start Date End Date Taking? Authorizing Provider  amLODipine (NORVASC) 5 MG tablet Take 5 mg by mouth at bedtime.    Historical Provider, MD  atorvastatin (LIPITOR) 40 MG tablet Take 40 mg by mouth at bedtime.    Historical Provider, MD  bisacodyl (DULCOLAX) 5 MG EC tablet Take 10 mg by mouth daily as needed for mild constipation or moderate constipation.    Historical Provider, MD  clopidogrel (PLAVIX) 75 MG tablet Take 75 mg by mouth at bedtime.     Historical Provider, MD  furosemide (LASIX) 40 MG tablet Take 1 tablet (40 mg total) by mouth daily. **Appointment must be made for additional refills** 02/27/14   Lorretta Harp, MD  levothyroxine (SYNTHROID, LEVOTHROID) 25 MCG tablet Take 25 mcg by mouth at bedtime.     Historical Provider, MD  metoprolol tartrate (LOPRESSOR) 25 MG tablet Take 0.5 tablets (12.5 mg total) by mouth 2 (two) times daily. *Appointment needed for further refills* 01/28/14   Pearletha Forge  Gwenlyn Found, MD  traZODone (DESYREL) 50 MG tablet Take 50 mg by mouth at bedtime.      Historical Provider, MD   BP 96/61 mmHg  Pulse 120  Temp(Src) 97.9 F (36.6 C) (Oral)  Resp 20  SpO2 92% Physical Exam CONSTITUTIONAL: elderly, frail HEAD: Normocephalic/atraumatic EYES: EOMI ENMT: Mucous membranes dry SPINE/BACK:cervical spine tenderness. No thoracic/lumbar tenderness.  No bruising/crepitance/stepoffs noted to spine CV: tachycardic, irregular LUNGS: crackles bilaterally, tachypnea noted ABDOMEN: soft, nontender, no rebound or guarding, bowel sounds noted throughout abdomen NEURO: Pt is awake/alert moves all extremitiesx4.  She is confused as she is unsure of birthdate.  She is unsure of what caused her fall.    EXTREMITIES: pulses normal/equal, full  ROM. Pelvis stable.  Skin tear to left forearm.  Bruise noted to left posterior thigh (appears old). All extremities/joints palpated/ranged and nontender SKIN: warm, color normal PSYCH: unable to assess  ED Course  Procedures  CRITICAL CARE Performed by: Sharyon Cable Total critical care time: 33 Critical care time was exclusive of separately billable procedures and treating other patients. Critical care was necessary to treat or prevent imminent or life-threatening deterioration. Critical care was time spent personally by me on the following activities: development of treatment plan with patient and/or surrogate as well as nursing, discussions with consultants, evaluation of patient's response to treatment, examination of patient, obtaining history from patient or surrogate, ordering and performing treatments and interventions, ordering and review of laboratory studies, ordering and review of radiographic studies, pulse oximetry and re-evaluation of patient's condition. PATIENT WITH ATRIAL FIBRILLATION WITH RAPID HEART RATE (120), ALSO EPISODES OF HYPOTENSION WITH ELEVATED LACTATE (LACTATE>4) AND REQUIRED IV FLUIDS RESUSCITATION  3:00 AM Pt here for fall However, she appears tachypneic/tachycardic.  She is also mildly hypotensive Possible sepsis, though could also be dehydrated For her fall - CT head/cspine ordered (pt reported neck pain) and c-collar ordered CXR/labs ordered as well to evaluate for sepsis Per son, bruise on thigh is known/old and from the railing on her hospital bed at home 3:09 AM I was informed by nursing that her BP has dropped to 80s Code sepsis called Appropriate IV fluids and IV antibiotics have been ordered 3:52 AM BP is improved, however lactate elevated >4 She has received 1LNS Will continue to rehydrate 4:41 AM CT imaging negative c-collar removed Will admit for further monitoring Pt will need to have repeat lactate after 2.5LNS given to patient for  possible sepsis.  Her blood pressure is improved. Pt awake/alert, no distress noted 4:46 AM D/w dr Orvan Falconer Will admit    Labs Review Labs Reviewed  BASIC METABOLIC PANEL - Abnormal; Notable for the following:    Sodium 134 (*)    Creatinine, Ser 2.32 (*)    GFR calc non Af Amer 19 (*)    GFR calc Af Amer 22 (*)    All other components within normal limits  CBC WITH DIFFERENTIAL/PLATELET - Abnormal; Notable for the following:    RBC 3.66 (*)    Hemoglobin 11.3 (*)    HCT 35.1 (*)    RDW 15.6 (*)    All other components within normal limits  CK - Abnormal; Notable for the following:    Total CK 188 (*)    All other components within normal limits  URINALYSIS, ROUTINE W REFLEX MICROSCOPIC - Abnormal; Notable for the following:    Hgb urine dipstick MODERATE (*)    Protein, ur TRACE (*)    All other components within normal limits  URINE MICROSCOPIC-ADD ON - Abnormal; Notable for the following:    Bacteria, UA FEW (*)    All other components within normal limits  I-STAT CG4 LACTIC ACID, ED - Abnormal; Notable for the following:    Lactic Acid, Venous 4.08 (*)    All other components within normal limits  CULTURE, BLOOD (ROUTINE X 2)  CULTURE, BLOOD (ROUTINE X 2)  URINE CULTURE  I-STAT TROPOININ, ED    Imaging Review Ct Head Wo Contrast  09/28/2014   CLINICAL DATA:  Lives at home. Urinary tract infection symptoms. Multiple falls, sharp neck pain and stiffness, headache and weakness.  EXAM: CT HEAD WITHOUT CONTRAST  CT CERVICAL SPINE WITHOUT CONTRAST  TECHNIQUE: Multidetector CT imaging of the head and cervical spine was performed following the standard protocol without intravenous contrast. Multiplanar CT image reconstructions of the cervical spine were also generated.  COMPARISON:  MRI of the brain report August 14, 2000 though images are not available for direct comparison.  FINDINGS: CT HEAD FINDINGS  No intraparenchymal hemorrhage, mass effect nor midline shift. Patchy  supratentorial white matter hypodensities are within normal range for patient's age and though non-specific suggest sequelae of chronic small vessel ischemic disease. No acute large vascular territory infarcts. LEFT occipital lobe encephalomalacia with ex vacuo dilatation of LEFT occipital horn. Ventricles and sulci are otherwise normal for patient's age.  No abnormal extra-axial fluid collections. Basal cisterns are patent. Moderate calcific atherosclerosis of the carotid siphons.  No skull fracture. The included ocular globes and orbital contents are non-suspicious. The mastoid aircells and included paranasal sinuses are well-aerated. Patient is edentulous.  CT CERVICAL SPINE FINDINGS  Cervical vertebral bodies and posterior elements intact. Straightened cervical lordosis. Large C7 superior endplate Schmorl's node with additional smaller Schmorl's nodes evident. Moderate C3-4 disc height loss, uncovertebral hypertrophy and at ventral endplate spurring consistent with degenerative disc, moderate C4-5, C5-6 and C6-7. C1-2 articulation maintained with mild arthropathy. Moderate calcific atherosclerosis of the carotid bulbs, without acute findings.  Mild osseous canal stenosis C4-5, C5-6.  IMPRESSION: CT HEAD: No acute intracranial process.  Remote LEFT posterior cerebral artery territory infarct. Mild to moderate white matter changes likely represent chronic small vessel ischemic disease.  CT CERVICAL SPINE: Straightened cervical lordosis without acute fracture nor malalignment.   Electronically Signed   By: Elon Alas   On: 09/28/2014 04:28   Ct Cervical Spine Wo Contrast  09/28/2014   CLINICAL DATA:  Lives at home. Urinary tract infection symptoms. Multiple falls, sharp neck pain and stiffness, headache and weakness.  EXAM: CT HEAD WITHOUT CONTRAST  CT CERVICAL SPINE WITHOUT CONTRAST  TECHNIQUE: Multidetector CT imaging of the head and cervical spine was performed following the standard protocol without  intravenous contrast. Multiplanar CT image reconstructions of the cervical spine were also generated.  COMPARISON:  MRI of the brain report August 14, 2000 though images are not available for direct comparison.  FINDINGS: CT HEAD FINDINGS  No intraparenchymal hemorrhage, mass effect nor midline shift. Patchy supratentorial white matter hypodensities are within normal range for patient's age and though non-specific suggest sequelae of chronic small vessel ischemic disease. No acute large vascular territory infarcts. LEFT occipital lobe encephalomalacia with ex vacuo dilatation of LEFT occipital horn. Ventricles and sulci are otherwise normal for patient's age.  No abnormal extra-axial fluid collections. Basal cisterns are patent. Moderate calcific atherosclerosis of the carotid siphons.  No skull fracture. The included ocular globes and orbital contents are non-suspicious. The mastoid aircells and included paranasal sinuses are well-aerated.  Patient is edentulous.  CT CERVICAL SPINE FINDINGS  Cervical vertebral bodies and posterior elements intact. Straightened cervical lordosis. Large C7 superior endplate Schmorl's node with additional smaller Schmorl's nodes evident. Moderate C3-4 disc height loss, uncovertebral hypertrophy and at ventral endplate spurring consistent with degenerative disc, moderate C4-5, C5-6 and C6-7. C1-2 articulation maintained with mild arthropathy. Moderate calcific atherosclerosis of the carotid bulbs, without acute findings.  Mild osseous canal stenosis C4-5, C5-6.  IMPRESSION: CT HEAD: No acute intracranial process.  Remote LEFT posterior cerebral artery territory infarct. Mild to moderate white matter changes likely represent chronic small vessel ischemic disease.  CT CERVICAL SPINE: Straightened cervical lordosis without acute fracture nor malalignment.   Electronically Signed   By: Elon Alas   On: 09/28/2014 04:28   Dg Chest Portable 1 View  09/28/2014   CLINICAL DATA:   Acute onset of bilateral sharp neck pain and stiffness. Multiple falls. Increased urinary frequency and hypotension. Initial encounter.  EXAM: PORTABLE CHEST - 1 VIEW  COMPARISON:  Chest radiograph performed 11/27/2013  FINDINGS: The lungs are well-aerated. Mild vascular congestion is noted. There is no evidence of focal opacification, pleural effusion or pneumothorax.  The cardiomediastinal silhouette is borderline normal in size. No acute osseous abnormalities are seen.  IMPRESSION: Mild vascular congestion noted; lungs remain grossly clear. No displaced rib fracture seen.   Electronically Signed   By: Garald Balding M.D.   On: 09/28/2014 03:55     EKG Interpretation   Date/Time:  Saturday September 28 2014 02:47:01 EDT Ventricular Rate:  129 PR Interval:    QRS Duration: 137 QT Interval:  375 QTC Calculation: 549 R Axis:   -83 Text Interpretation:  Atrial fibrillation Left axis deviation Right bundle  branch block Inferior infarct, old Abnormal ekg No significant change  since last tracing Confirmed by Christy Gentles  MD, Tyresse Jayson ([id number]) on 09/28/2014  2:55:33 AM     Medications  sodium chloride 0.9 % bolus 1,000 mL (1,000 mLs Intravenous New Bag/Given 09/28/14 0313)    Followed by  sodium chloride 0.9 % bolus 500 mL (not administered)  cefTRIAXone (ROCEPHIN) 2 g in dextrose 5 % 50 mL IVPB (2 g Intravenous New Bag/Given 09/28/14 0340)    MDM   Final diagnoses:  Fall, initial encounter  Atrial fibrillation with rapid ventricular response  Dehydration  Hypotension, unspecified hypotension type  AKI (acute kidney injury)  Delirium    Nursing notes including past medical history and social history reviewed and considered in documentation Previous records reviewed and considered xrays/imaging reviewed by myself and considered during evaluation Labs/vital reviewed myself and considered during evaluation     Ripley Fraise, MD 09/28/14 [phone]

## 2014-09-28 NOTE — Progress Notes (Signed)
The patient is a 81 year old woman who was admitted this morning following a fall and confusion at home. She was briefly seen and examined. Her chart, vital signs, laboratory studies were reviewed.  On exam, the patient is noted to have a massively large right buttock mass, likely a hematoma from her fall at home. We will order a pelvis CT for further evaluation.  1. Probable early sepsis from UTI. Continue vancomycin and Zosyn for now. Cultures pending. 2. Lactic acidosis. Repeat lactic acid level in the morning. 3. Acute kidney injury superimposed on stage III chronic kidney disease. Per review of her records, the patient's baseline creatinine is 1.6. 4. Large right buttock hematoma, status post fall. Will order a pelvis CT for further evaluation. We'll monitor her CBC daily. --Apply warm compress. --Hold subcutaneous heparin and start SCDs for DVT prophylaxis. 5. Mild acute encephalopathy superimposed on chronic dementia. 6. Paroxysmal atrial fibrillation, apparently not on anticoagulation due to her thoracic aneurysm (per cardiology's office note). 7. History of CAD with prior MI. 8. Anemia and thrombocytopenia, in part, delusional. We'll order a vitamin B12 level and TSH.

## 2014-09-28 NOTE — ED Notes (Signed)
Patient lives at home with son. Son stated he called EMS because patient has fallen multiple times and has been hallucinating and appearing to be more confused. Son states patient has dementia and talks out of her head at time, but son states patient has been more confused than normal. Patient has large bruising to the back of legs bilaterally that appears to be old. Son states patient hits legs on sides of hospital bed at home. Patient also has skin tear to left forearm, no active bleeding at this time. Patient complains of pain to neck.

## 2014-09-28 NOTE — ED Notes (Signed)
Pt lives at home, states she has fallen a couple of times tonight.  EMS called to home for fall and pt denies c/o pain from fall but states she has been urinating more frequently and thinks she has another uti.  Pt admits to chronic uti's

## 2014-09-28 NOTE — Progress Notes (Signed)
Called to room by patient stating she was having difficulty urinating and could not even remember the last time she had voided.  Primary nurse at lunch.  Bladder scan revealed 649ml urine in bladder.  MD notified.  New order received for foley catheter and urinalysis. MD also asked to inquire when patient's last bowel movement was and to order Dulcolax suppository if it had been awhile since patient had defecated.  Patient stated she had a bowel movement yesterday, confirmed with primary RN when she returned to floor.  Dulcolax suppository not ordered at this time. Made MD aware.  Will continue to monitor. Schonewitz, Eulis Canner 09/28/2014

## 2014-09-28 NOTE — Progress Notes (Signed)
ANTIBIOTIC CONSULT NOTE  Pharmacy Consult for Vancomycin & Zosyn Indication: rule out sepsis  No Known Allergies  Patient Measurements: Height: 5\' 4"  (162.6 cm) Weight: 176 lb 5.9 oz (80 kg) IBW/kg (Calculated) : 54.7  Vital Signs: Temp: 97.5 F (36.4 C) (04/02 0630) Temp Source: Oral (04/02 0630) BP: 117/99 mmHg (04/02 0600) Pulse Rate: 99 (04/02 0600) Intake/Output from previous day: 04/01 0701 - 04/02 0700 In: 383.3 [IV Piggyback:383.3] Out: -  Intake/Output from this shift:    Labs:  Recent Labs  09/28/14 0309 09/28/14 0659  WBC 9.0 8.6  HGB 11.3* 9.8*  PLT 150 135*  CREATININE 2.32* 2.22*   Estimated Creatinine Clearance: 21 mL/min (by C-G formula based on Cr of 2.22). No results for input(s): VANCOTROUGH, VANCOPEAK, VANCORANDOM, GENTTROUGH, GENTPEAK, GENTRANDOM, TOBRATROUGH, TOBRAPEAK, TOBRARND, AMIKACINPEAK, AMIKACINTROU, AMIKACIN in the last 72 hours.   Microbiology: Recent Results (from the past 720 hour(s))  Blood culture (routine x 2)     Status: None (Preliminary result)   Collection Time: 09/28/14  3:09 AM  Result Value Ref Range Status   Specimen Description LEFT ANTECUBITAL  Final   Special Requests   Final    BOTTLES DRAWN AEROBIC AND ANAEROBIC 6CC DRAWN BY RN   Culture PENDING  Incomplete   Report Status PENDING  Incomplete  Blood culture (routine x 2)     Status: None (Preliminary result)   Collection Time: 09/28/14  3:14 AM  Result Value Ref Range Status   Specimen Description BLOOD LEFT HAND  Final   Special Requests BOTTLES DRAWN AEROBIC ONLY 5CC  Final   Culture PENDING  Incomplete   Report Status PENDING  Incomplete    Anti-infectives    Start     Dose/Rate Route Frequency Ordered Stop   09/29/14 0600  vancomycin (VANCOCIN) IVPB 1000 mg/200 mL premix     1,000 mg 200 mL/hr over 60 Minutes Intravenous Every 24 hours 09/28/14 0758     09/28/14 1400  piperacillin-tazobactam (ZOSYN) IVPB 3.375 g     3.375 g 12.5 mL/hr over 240  Minutes Intravenous 3 times per day 09/28/14 0758     09/28/14 0615  vancomycin (VANCOCIN) IVPB 1000 mg/200 mL premix     1,000 mg 200 mL/hr over 60 Minutes Intravenous  Once 09/28/14 0613 09/28/14 0728   09/28/14 0600  piperacillin-tazobactam (ZOSYN) IVPB 3.375 g     3.375 g 100 mL/hr over 30 Minutes Intravenous  Once 09/28/14 0557 09/28/14 0648   09/28/14 0315  cefTRIAXone (ROCEPHIN) 2 g in dextrose 5 % 50 mL IVPB     2 g 100 mL/hr over 30 Minutes Intravenous  Once 09/28/14 0309 09/28/14 0415      Assessment: 81 yoF who presented to ED with confusion.  She was started on empiric antibiotics for possible sepsis due to hypotension, tachycardia, and elevated lactic acid level.  No source of infection identified (CXR and UA negative). She remains afebrile with normal WBC.  Lactic acid has normalized. Cx data pending.  Acute kidney injury on admission.  Scr trending down.    Vancomycin 4/2>> Zosyn 4/2>> Rocephin x1 dose 4/2  Goal of Therapy:  Vancomycin trough level 15-20 mcg/ml  Plan:  Zosyn 3.375gm IV Q8h to be infused over 4hrs Vancomycin 1gm IV q24h Check Vancomycin trough at steady state Monitor renal function and cx data  Duration of therapy per MD  Biagio Borg 09/28/2014,8:00 AM

## 2014-09-28 NOTE — H&P (Addendum)
[facility] History and Physical  CHARNICE ZWILLING BLT:[id number] DOB: [birth year]    PCP:   [name], MD   Chief Complaint: Brought into the ER by her son after a fall.  HPI: Brandy Mcguire is an 81 y.o. female with hx of dementia, hx of CHF, HTN, CAD s/p prior MI, hypothyroidism, chronic afib managed by cardiology, not on anticoagulation due to her thoracic aneurysm (per cardiology's office note), lives at home with her son, brought to the ER after a fall.  She did not lose consciousness.  She was noted to be more confused tonight than her baseline dementia.   In the ER, she was found to have hypotension, with SBP 80's, tachycardic, and found to have elevated lactic acid level of 4.0.  Sepsis work up was done, but no definite source was found, to include a negative UA, negative CXR for infiltrate, though some vascular congestion was found.  Her WBC was not elevated, and she had no fever.  She was given 2.5 L of IVF bolus, and her SBP was about 100.  She also had a negative head and cervical CT.  When I saw her, she was alert and knows he is in the hospital.  She converse meaningfully.  Her Cr was found to be elevated to 2.32, with bicarb of 27.  Hospitalist was asked to admit her for possible sepsis of unclear source, with volume depletion and AKI, along with transient altered mental status.   Rewiew of Systems:  Constitutional: Negative for malaise, fever and chills. No significant weight loss or weight gain Eyes: Negative for eye pain, redness and discharge, diplopia, visual changes, or flashes of light. ENMT: Negative for ear pain, hoarseness, nasal congestion, sinus pressure and sore throat. No headaches; tinnitus, drooling, or problem swallowing. Cardiovascular: Negative for chest pain, palpitations, diaphoresis, dyspnea and peripheral edema. ; No orthopnea, PND Respiratory: Negative for cough, hemoptysis, wheezing and stridor. No pleuritic chestpain. Gastrointestinal:  Negative for nausea, vomiting, diarrhea, constipation, abdominal pain, melena, blood in stool, hematemesis, jaundice and rectal bleeding.    Genitourinary: Negative for frequency, dysuria, incontinence,flank pain and hematuria; Musculoskeletal: Negative for back pain and neck pain. Negative for swelling and trauma.;  Skin: . Negative for pruritus, rash, abrasions, bruising and skin lesion.; ulcerations Neuro: Negative for headache, lightheadedness and neck stiffness. Negative for weakness, altered level of consciousness , altered mental status, extremity weakness, burning feet, involuntary movement, seizure and syncope.  Psych: negative for anxiety, depression, insomnia, tearfulness, panic attacks, hallucinations, paranoia, suicidal or homicidal ideation   Past Medical History  Diagnosis Date  . Stroke   . MI (myocardial infarction)   . Hypertension   . Bite from cat 03/17/2011  . CHF (congestive heart failure)     Past Surgical History  Procedure Laterality Date  . Stents       kidneys and 2 in heart  . Abdominal hysterectomy      Medications:  HOME MEDS: Prior to Admission medications   Medication Sig Start Date End Date Taking? Authorizing Provider  amLODipine (NORVASC) 5 MG tablet Take 5 mg by mouth at bedtime.   Yes Historical Provider, MD  atorvastatin (LIPITOR) 40 MG tablet Take 40 mg by mouth at bedtime.   Yes Historical Provider, MD  clopidogrel (PLAVIX) 75 MG tablet Take 75 mg by mouth at bedtime.    Yes Historical Provider, MD  senna-docusate (SENOKOT-S) 8.6-50 MG per tablet Take 1 tablet by mouth daily.   Yes Historical Provider, MD  traZODone (DESYREL) 50 MG tablet Take 50 mg by mouth at bedtime.     Yes Historical Provider, MD  bisacodyl (DULCOLAX) 5 MG EC tablet Take 10 mg by mouth daily as needed for mild constipation or moderate constipation.    Historical Provider, MD  furosemide (LASIX) 40 MG tablet Take 1 tablet (40 mg total) by mouth daily. **Appointment must be  made for additional refills** 02/27/14   Lorretta Harp, MD  levothyroxine (SYNTHROID, LEVOTHROID) 25 MCG tablet Take 25 mcg by mouth at bedtime.     Historical Provider, MD  metoprolol tartrate (LOPRESSOR) 25 MG tablet Take 0.5 tablets (12.5 mg total) by mouth 2 (two) times daily. *Appointment needed for further refills* 01/28/14   Lorretta Harp, MD     Allergies:  No Known Allergies  Social History:   reports that she quit smoking about 13 years ago. Her smoking use included Cigarettes. She has a 50 pack-year smoking history. She has never used smokeless tobacco. She reports that she does not drink alcohol or use illicit drugs.  Family History: Family History  Problem Relation Age of Onset  . Cancer Mother   . Cancer Sister   . COPD Sister   . Heart failure Sister      Physical Exam: Filed Vitals:   09/28/14 0430 09/28/14 0445 09/28/14 0453 09/28/14 0500  BP:   114/85 101/83  Pulse: 101 105 100 110  Temp:      TempSrc:      Resp: 22 26 29 23   Weight:      SpO2: 99% 100% 99% 97%   Blood pressure 101/83, pulse 110, temperature 98.5 F (36.9 C), temperature source Rectal, resp. rate 23, weight 77.111 kg (170 lb), SpO2 97 %.  GEN:  Pleasant patient lying in the stretcher in no acute distress; cooperative with exam. PSYCH:  alert and oriented x4; does not appear anxious or depressed; affect is appropriate. HEENT: Mucous membranes pink and anicteric; PERRLA; EOM intact; no cervical lymphadenopathy nor thyromegaly or carotid bruit; no JVD; There were no stridor. Neck is very supple. Breasts:: Not examined CHEST WALL: No tenderness CHEST: Normal respiration, clear to auscultation bilaterally.  HEART: Regular rate and rhythm.  There are no murmur, rub, or gallops.   BACK: No kyphosis or scoliosis; no CVA tenderness ABDOMEN: soft and non-tender; no masses, no organomegaly, normal abdominal bowel sounds; no pannus; no intertriginous candida. There is no rebound and no  distention. Rectal Exam: Not done EXTREMITIES: No bone or joint deformity; age-appropriate arthropathy of the hands and knees; no edema; no ulcerations.  There is no calf tenderness. Genitalia: not examined PULSES: 2+ and symmetric SKIN: Normal hydration no rash or ulceration CNS: Cranial nerves 2-12 grossly intact no focal lateralizing neurologic deficit.  Speech is fluent; uvula elevated with phonation, facial symmetry and tongue midline. DTR are normal bilaterally, cerebella exam is intact, barbinski is negative and strengths are equaled bilaterally.  No sensory loss.   Labs on Admission:  Basic Metabolic Panel:  Recent Labs Lab 09/28/14 0309  NA 134*  K 4.1  CL 97  CO2 27  GLUCOSE 96  BUN 20  CREATININE 2.32*  CALCIUM 8.6   CBC:  Recent Labs Lab 09/28/14 0309  WBC 9.0  NEUTROABS 6.7  HGB 11.3*  HCT 35.1*  MCV 95.9  PLT 150   Cardiac Enzymes:  Recent Labs Lab 09/28/14 0309  CKTOTAL 188*   Radiological Exams on Admission: Ct Head Wo Contrast  09/28/2014   CLINICAL  DATA:  Lives at home. Urinary tract infection symptoms. Multiple falls, sharp neck pain and stiffness, headache and weakness.  EXAM: CT HEAD WITHOUT CONTRAST  CT CERVICAL SPINE WITHOUT CONTRAST  TECHNIQUE: Multidetector CT imaging of the head and cervical spine was performed following the standard protocol without intravenous contrast. Multiplanar CT image reconstructions of the cervical spine were also generated.  COMPARISON:  MRI of the brain report August 14, 2000 though images are not available for direct comparison.  FINDINGS: CT HEAD FINDINGS  No intraparenchymal hemorrhage, mass effect nor midline shift. Patchy supratentorial white matter hypodensities are within normal range for patient's age and though non-specific suggest sequelae of chronic small vessel ischemic disease. No acute large vascular territory infarcts. LEFT occipital lobe encephalomalacia with ex vacuo dilatation of LEFT occipital horn.  Ventricles and sulci are otherwise normal for patient's age.  No abnormal extra-axial fluid collections. Basal cisterns are patent. Moderate calcific atherosclerosis of the carotid siphons.  No skull fracture. The included ocular globes and orbital contents are non-suspicious. The mastoid aircells and included paranasal sinuses are well-aerated. Patient is edentulous.  CT CERVICAL SPINE FINDINGS  Cervical vertebral bodies and posterior elements intact. Straightened cervical lordosis. Large C7 superior endplate Schmorl's node with additional smaller Schmorl's nodes evident. Moderate C3-4 disc height loss, uncovertebral hypertrophy and at ventral endplate spurring consistent with degenerative disc, moderate C4-5, C5-6 and C6-7. C1-2 articulation maintained with mild arthropathy. Moderate calcific atherosclerosis of the carotid bulbs, without acute findings.  Mild osseous canal stenosis C4-5, C5-6.  IMPRESSION: CT HEAD: No acute intracranial process.  Remote LEFT posterior cerebral artery territory infarct. Mild to moderate white matter changes likely represent chronic small vessel ischemic disease.  CT CERVICAL SPINE: Straightened cervical lordosis without acute fracture nor malalignment.   Electronically Signed   By: Elon Alas   On: 09/28/2014 04:28   Ct Cervical Spine Wo Contrast  09/28/2014   CLINICAL DATA:  Lives at home. Urinary tract infection symptoms. Multiple falls, sharp neck pain and stiffness, headache and weakness.  EXAM: CT HEAD WITHOUT CONTRAST  CT CERVICAL SPINE WITHOUT CONTRAST  TECHNIQUE: Multidetector CT imaging of the head and cervical spine was performed following the standard protocol without intravenous contrast. Multiplanar CT image reconstructions of the cervical spine were also generated.  COMPARISON:  MRI of the brain report August 14, 2000 though images are not available for direct comparison.  FINDINGS: CT HEAD FINDINGS  No intraparenchymal hemorrhage, mass effect nor midline  shift. Patchy supratentorial white matter hypodensities are within normal range for patient's age and though non-specific suggest sequelae of chronic small vessel ischemic disease. No acute large vascular territory infarcts. LEFT occipital lobe encephalomalacia with ex vacuo dilatation of LEFT occipital horn. Ventricles and sulci are otherwise normal for patient's age.  No abnormal extra-axial fluid collections. Basal cisterns are patent. Moderate calcific atherosclerosis of the carotid siphons.  No skull fracture. The included ocular globes and orbital contents are non-suspicious. The mastoid aircells and included paranasal sinuses are well-aerated. Patient is edentulous.  CT CERVICAL SPINE FINDINGS  Cervical vertebral bodies and posterior elements intact. Straightened cervical lordosis. Large C7 superior endplate Schmorl's node with additional smaller Schmorl's nodes evident. Moderate C3-4 disc height loss, uncovertebral hypertrophy and at ventral endplate spurring consistent with degenerative disc, moderate C4-5, C5-6 and C6-7. C1-2 articulation maintained with mild arthropathy. Moderate calcific atherosclerosis of the carotid bulbs, without acute findings.  Mild osseous canal stenosis C4-5, C5-6.  IMPRESSION: CT HEAD: No acute intracranial process.  Remote LEFT posterior  cerebral artery territory infarct. Mild to moderate white matter changes likely represent chronic small vessel ischemic disease.  CT CERVICAL SPINE: Straightened cervical lordosis without acute fracture nor malalignment.   Electronically Signed   By: Elon Alas   On: 09/28/2014 04:28   Dg Chest Portable 1 View  09/28/2014   CLINICAL DATA:  Acute onset of bilateral sharp neck pain and stiffness. Multiple falls. Increased urinary frequency and hypotension. Initial encounter.  EXAM: PORTABLE CHEST - 1 VIEW  COMPARISON:  Chest radiograph performed 11/27/2013  FINDINGS: The lungs are well-aerated. Mild vascular congestion is noted. There is  no evidence of focal opacification, pleural effusion or pneumothorax.  The cardiomediastinal silhouette is borderline normal in size. No acute osseous abnormalities are seen.  IMPRESSION: Mild vascular congestion noted; lungs remain grossly clear. No displaced rib fracture seen.   Electronically Signed   By: Garald Balding M.D.   On: 09/28/2014 03:55    EKG: Independently reviewed. Afib with controlled HR.   Assessment/Plan Present on Admission:  . Sepsis associated hypotension . Cognitive decline . CAD (coronary artery disease) . Volume depletion . Sepsis  PLAN:  Will admit her into the ICU for possible sepsis.  Will continue with sepsis protocol.  She is better and is more alert.  She was able to tolerate IVF bolus, though we will be careful with further IVF as she had hx of CHF, and her CXR had some vascular congestion.  Will use IV van/zosyn.  Her BP meds will be held.  She was also found to have AKI, suspect pre-renal, and should improve with IVF.  For her hypothyroidism, will continue supplement and check her TSH.  Her mental status has improved, and she does have hx of dementia.  She is stable, full code, and will be admitted to the ICU under [facility] service.   Other plans as per orders.  Code Status: FULL Haskel Khan, MD. [facility] Pager [phone] 7pm to 7am.  09/28/2014, 5:23 AM

## 2014-09-28 NOTE — Progress Notes (Signed)
ANTIBIOTIC CONSULT NOTE-Preliminary  Pharmacy Consult for vancomycin and Zosyn Indication: rule out sepsis  No Known Allergies  Patient Measurements: Weight: 170 lb (77.111 kg)  Vital Signs: Temp: 98.5 F (36.9 C) (04/02 0333) Temp Source: Rectal (04/02 0333) BP: 103/75 mmHg (04/02 0545) Pulse Rate: 101 (04/02 0545)  Labs:  Recent Labs  09/28/14 0309  WBC 9.0  HGB 11.3*  PLT 150  CREATININE 2.32*    Estimated CrCl ~ 22 ml/min   Microbiology: Recent Results (from the past 720 hour(s))  Blood culture (routine x 2)     Status: None (Preliminary result)   Collection Time: 09/28/14  3:09 AM  Result Value Ref Range Status   Specimen Description LEFT ANTECUBITAL  Final   Special Requests   Final    BOTTLES DRAWN AEROBIC AND ANAEROBIC 6CC DRAWN BY RN   Culture PENDING  Incomplete   Report Status PENDING  Incomplete  Blood culture (routine x 2)     Status: None (Preliminary result)   Collection Time: 09/28/14  3:14 AM  Result Value Ref Range Status   Specimen Description BLOOD LEFT HAND  Final   Special Requests BOTTLES DRAWN AEROBIC ONLY 5CC  Final   Culture PENDING  Incomplete   Report Status PENDING  Incomplete    Medical History: Past Medical History  Diagnosis Date  . Stroke   . MI (myocardial infarction)   . Hypertension   . Bite from cat 03/17/2011  . CHF (congestive heart failure)     Medications:  Ceftriaxone 2 g IV x 1 on 4/2 at 0340 Zosyn 3.375g IV x 1 on 4/2 at 0618 Vancomycin 1 g IV x 1 ordered  Assessment: Pt is a 81 yo F being initiated on vancomycin and Zosyn for possible sepsis. Doses already ordered by MD in ICU. Dosing reviewed by pharmacy and is appropriate for initial bolus.   Goal of Therapy:  Vancomycin trough level 15-20 mcg/ml  Plan:  Preliminary review of pertinent patient information completed. Forestine Na clinical pharmacist will complete review during morning rounds to assess patient and finalize treatment regimen.  Addison Lank [place], [facility] 09/28/2014,6:20 AM

## 2014-09-28 NOTE — Progress Notes (Signed)
eLink Physician-Brief Progress Note Patient Name: Brandy Mcguire DOB: 08/29/1933 MRN: [id number]   Date of Service  09/28/2014  HPI/Events of Note  81 yo female with PMH Dementia, HTN, CAD - s/p MI and stent placement, CVA and CHF. Admitted s/p fall. Found to have UTI and possible sepsis. Now being empirically Rxed with Vancomycin, Zosyn and Rocephin. BP = 103/75, HR = 101, RR = 18 and O2 sat = 98%.  eICU Interventions  Continue present management.      Intervention Category Evaluation Type: New Patient Evaluation  Lysle Dingwall 09/28/2014, 6:56 AM

## 2014-09-29 ENCOUNTER — Encounter (HOSPITAL_COMMUNITY): Payer: Self-pay | Admitting: Internal Medicine

## 2014-09-29 DIAGNOSIS — R339 Retention of urine, unspecified: Secondary | ICD-10-CM | POA: Diagnosis present

## 2014-09-29 DIAGNOSIS — N390 Urinary tract infection, site not specified: Secondary | ICD-10-CM

## 2014-09-29 DIAGNOSIS — I716 Thoracoabdominal aortic aneurysm, without rupture, unspecified: Secondary | ICD-10-CM | POA: Diagnosis present

## 2014-09-29 DIAGNOSIS — N179 Acute kidney failure, unspecified: Secondary | ICD-10-CM

## 2014-09-29 DIAGNOSIS — D62 Acute posthemorrhagic anemia: Secondary | ICD-10-CM | POA: Diagnosis not present

## 2014-09-29 HISTORY — DX: Thoracoabdominal aortic aneurysm, without rupture: I71.6

## 2014-09-29 HISTORY — DX: Thoracoabdominal aortic aneurysm, without rupture, unspecified: I71.60

## 2014-09-29 LAB — COMPREHENSIVE METABOLIC PANEL
ALT: 12 U/L (ref 0–35)
ANION GAP: 7 (ref 5–15)
AST: 38 U/L — ABNORMAL HIGH (ref 0–37)
Albumin: 3.1 g/dL — ABNORMAL LOW (ref 3.5–5.2)
Alkaline Phosphatase: 29 U/L — ABNORMAL LOW (ref 39–117)
BUN: 18 mg/dL (ref 6–23)
CALCIUM: 7.6 mg/dL — AB (ref 8.4–10.5)
CO2: 23 mmol/L (ref 19–32)
Chloride: 107 mmol/L (ref 96–112)
Creatinine, Ser: 2.03 mg/dL — ABNORMAL HIGH (ref 0.50–1.10)
GFR calc Af Amer: 26 mL/min — ABNORMAL LOW (ref >90)
GFR, EST NON AFRICAN AMERICAN: 22 mL/min — AB (ref >90)
Glucose, Bld: 91 mg/dL (ref 70–99)
Potassium: 4 mmol/L (ref 3.5–5.1)
Sodium: 137 mmol/L (ref 135–145)
TOTAL PROTEIN: 5.7 g/dL — AB (ref 6.0–8.3)
Total Bilirubin: 0.7 mg/dL (ref 0.3–1.2)

## 2014-09-29 LAB — CBC
HCT: 25.8 % — ABNORMAL LOW (ref 36.0–46.0)
HCT: 28.5 % — ABNORMAL LOW (ref 36.0–46.0)
Hemoglobin: 8.2 g/dL — ABNORMAL LOW (ref 12.0–15.0)
Hemoglobin: 9.3 g/dL — ABNORMAL LOW (ref 12.0–15.0)
MCH: 31.2 pg (ref 26.0–34.0)
MCH: 30.9 pg (ref 26.0–34.0)
MCHC: 31.8 g/dL (ref 30.0–36.0)
MCHC: 32.6 g/dL (ref 30.0–36.0)
MCV: 98.1 fL (ref 78.0–100.0)
MCV: 94.7 fL (ref 78.0–100.0)
PLATELETS: 109 10*3/uL — AB (ref 150–400)
Platelets: 97 10*3/uL — ABNORMAL LOW (ref 150–400)
RBC: 2.63 MIL/uL — ABNORMAL LOW (ref 3.87–5.11)
RBC: 3.01 MIL/uL — AB (ref 3.87–5.11)
RDW: 15.8 % — ABNORMAL HIGH (ref 11.5–15.5)
RDW: 16.1 % — ABNORMAL HIGH (ref 11.5–15.5)
WBC: 5.6 10*3/uL (ref 4.0–10.5)
WBC: 5.6 10*3/uL (ref 4.0–10.5)

## 2014-09-29 LAB — TSH: TSH: 4.035 u[IU]/mL (ref 0.350–4.500)

## 2014-09-29 LAB — VITAMIN B12: Vitamin B-12: 250 pg/mL (ref 211–911)

## 2014-09-29 LAB — HEMOGLOBIN AND HEMATOCRIT, BLOOD
HEMATOCRIT: 23.7 % — AB (ref 36.0–46.0)
Hemoglobin: 7.7 g/dL — ABNORMAL LOW (ref 12.0–15.0)

## 2014-09-29 LAB — PREPARE RBC (CROSSMATCH)

## 2014-09-29 LAB — ABO/RH: ABO/RH(D): O POS

## 2014-09-29 LAB — CALCIUM, IONIZED: CALCIUM ION: 1.11 mmol/L — AB (ref 1.12–1.32)

## 2014-09-29 MED ORDER — ENSURE ENLIVE PO LIQD
237.0000 mL | Freq: Two times a day (BID) | ORAL | Status: DC
  Administered 2014-09-29 – 2014-10-03 (×7): 237 mL via ORAL

## 2014-09-29 MED ORDER — TRAZODONE HCL 50 MG PO TABS
50.0000 mg | ORAL_TABLET | Freq: Every evening | ORAL | Status: DC | PRN
  Administered 2014-09-29 – 2014-10-02 (×3): 50 mg via ORAL
  Filled 2014-09-29 (×3): qty 1

## 2014-09-29 MED ORDER — ENSURE PUDDING PO PUDG: 1.0000 | Freq: Three times a day (TID) | ORAL | Status: DC

## 2014-09-29 MED ORDER — FUROSEMIDE 10 MG/ML IJ SOLN
10.0000 mg | Freq: Once | INTRAMUSCULAR | Status: AC
  Administered 2014-09-29: 10 mg via INTRAVENOUS
  Filled 2014-09-29: qty 2

## 2014-09-29 MED ORDER — DILTIAZEM HCL 30 MG PO TABS
30.0000 mg | ORAL_TABLET | Freq: Three times a day (TID) | ORAL | Status: DC
  Administered 2014-09-29 – 2014-10-03 (×14): 30 mg via ORAL
  Filled 2014-09-29 (×15): qty 1

## 2014-09-29 MED ORDER — SODIUM CHLORIDE 0.9 % IV SOLN: Freq: Once | INTRAVENOUS | Status: DC

## 2014-09-29 MED ORDER — DIPHENHYDRAMINE HCL 25 MG PO CAPS: 25.0000 mg | ORAL_CAPSULE | Freq: Every evening | ORAL | Status: DC | PRN

## 2014-09-29 MED ORDER — ZOLPIDEM TARTRATE 5 MG PO TABS: 5.0000 mg | ORAL_TABLET | Freq: Every evening | ORAL | Status: DC | PRN

## 2014-09-29 NOTE — Progress Notes (Signed)
[facility] PROGRESS NOTE  Brandy Mcguire JSE:[id number] DOB: August 27, 1933 DOA: 09/28/2014 PCP: [name], MD    Code Status: Full code Family Communication: Family not available; will discuss with son when he arrives. Disposition Plan: Discharge when clinically appropriate.   Consultants:  None  Procedures:  Packed red blood cell transfusion pending  Antibiotics:  Zosyn 4/2>>  Vancomycin 4/2>>  HPI/Subjective: The patient had some confusion overnight, but was redirected.  She admits to some chest congestion, but she denies chest pain. She has less pain over her right buttock.  Objective: Filed Vitals:   09/29/14 0600  BP: 125/88  Pulse: 92  Temp:   Resp: 29    Intake/Output Summary (Last 24 hours) at 09/29/14 0832 Last data filed at 09/29/14 0600  Gross per 24 hour  Intake 4724.17 ml  Output   1450 ml  Net 3274.17 ml   Filed Weights   09/28/14 0339 09/28/14 0630 09/29/14 0500  Weight: 77.111 kg (170 lb) 80 kg (176 lb 5.9 oz) 83.4 kg (183 lb 13.8 oz)    Exam:   General:  Elderly slightly confused 81 year old woman in no acute distress.  Cardiovascular: Irregular, irregular, with mild tachycardia.  Respiratory: Occasional crackles;, partially cleared with coughing breathing is nonlabored  Abdomen: Positive bowel sounds, soft, nontender, nondistended.  Musculoskeletal/extremities: Massively large right buttock firm mass, consistent with a hematoma; mildly tender; some surrounding ecchymosis. No pedal edema. No other acute hot red joints.  Neurologic/psychiatric: The patient is alert and oriented to herself and hospital. She follows directions well. She has some difficulty recalling why she was admitted to the hospital and other generalized information. Cranial nerves II through XII are grossly intact.  Data Reviewed: Basic Metabolic Panel:  Recent Labs Lab 09/28/14 0309 09/28/14 0659 09/29/14 0404  NA 134*  --  137  K 4.1  --  4.0   CL 97  --  107  CO2 27  --  23  GLUCOSE 96  --  91  BUN 20  --  18  CREATININE 2.32* 2.22* 2.03*  CALCIUM 8.6  --  7.6*   Liver Function Tests:  Recent Labs Lab 09/29/14 0404  AST 38*  ALT 12  ALKPHOS 29*  BILITOT 0.7  PROT 5.7*  ALBUMIN 3.1*   No results for input(s): LIPASE, AMYLASE in the last 168 hours. No results for input(s): AMMONIA in the last 168 hours. CBC:  Recent Labs Lab 09/28/14 0309 09/28/14 0659 09/29/14 0404  WBC 9.0 8.6 5.6  NEUTROABS 6.7  --   --   HGB 11.3* 9.8* 8.2*  HCT 35.1* 30.4* 25.8*  MCV 95.9 95.9 98.1  PLT 150 135* 97*   Cardiac Enzymes:  Recent Labs Lab 09/28/14 0309  CKTOTAL 188*   BNP (last 3 results) No results for input(s): BNP in the last 8760 hours.  ProBNP (last 3 results)  Recent Labs  11/27/13 2255  PROBNP 4215.0*    CBG: No results for input(s): GLUCAP in the last 168 hours.  Recent Results (from the past 240 hour(s))  Blood culture (routine x 2)     Status: None (Preliminary result)   Collection Time: 09/28/14  3:09 AM  Result Value Ref Range Status   Specimen Description LEFT ANTECUBITAL  Final   Special Requests   Final    BOTTLES DRAWN AEROBIC AND ANAEROBIC 6CC DRAWN BY RN   Culture NO GROWTH <24 HRS  Final   Report Status PENDING  Incomplete  Blood culture (routine x  2)     Status: None (Preliminary result)   Collection Time: 09/28/14  3:14 AM  Result Value Ref Range Status   Specimen Description BLOOD LEFT HAND  Final   Special Requests BOTTLES DRAWN AEROBIC ONLY 5CC  Final   Culture NO GROWTH <24 HRS  Final   Report Status PENDING  Incomplete  MRSA PCR Screening     Status: None   Collection Time: 09/28/14  6:14 AM  Result Value Ref Range Status   MRSA by PCR NEGATIVE NEGATIVE Final    Comment:        The GeneXpert MRSA Assay (FDA approved for NASAL specimens only), is one component of a comprehensive MRSA colonization surveillance program. It is not intended to diagnose MRSA infection  nor to guide or monitor treatment for MRSA infections.      Studies: Ct Abdomen Pelvis Wo Contrast  09/28/2014   CLINICAL DATA:  Large bruising bilaterally to back of thighs and buttocks that appears to be old per ED notes. Bruising has dark appearance. Pt has had multiple falls recently but is unable to give clear details due to dementia. Pt has known thoracic and aortic aneurysm, hx of hysterectomy,chf,CKD  EXAM: CT ABDOMEN AND PELVIS WITHOUT CONTRAST  TECHNIQUE: Multidetector CT imaging of the abdomen and pelvis was performed following the standard protocol without IV contrast.  COMPARISON:  11/26/2009  FINDINGS: There is infiltration throughout the soft tissues of the left inferior gluteal region with a smaller more focal areas central opacity. Findings are consistent with a soft tissue hematoma and associated edema. There is no evidence of a proximal femur or pelvic fracture. There is no evidence of osteomyelitis.  No lung base consolidation or edema. Stable 8 mm nodule at the base of the right lower lobe.  There is diffuse dilation of the thoracoabdominal aorta. Just above the aortic hiatus, the inferior descending thoracic aorta measures 6.4 cm x 6 cm in greatest transverse dimensions, previously 4.9 cm in greatest diameter. At the level of the midpole the left kidney, aorta measures 6.5 cm x 5.8 cm transversely. It measures 4.2 cm in greatest anterior-posterior dimension previously, 5.8 cm on the current exam. No evidence of aortic rupture. There is diffuse atherosclerotic plaque and evidence of mural thrombus.  Liver, spleen, gallbladder and pancreas are unremarkable. No adrenal masses.  Superiorly atrophic right kidney, stable. Nonobstructing stone in the lower pole of the left kidney. No left renal mass. No hydronephrosis. Ureters normal course and in caliber.  Bladder is mildly distended. There are calcifications that project along the posterior bladder. This area was not imaged on prior CTs. No  bladder wall thickening. No definite mass.  Uterus surgically absent.  No pelvic masses.  No pathologically enlarged lymph nodes. No ascites or hemo peritoneum.  There are scattered colonic diverticula. No diverticulitis. No bowel wall thickening or inflammatory changes. No dilation to suggest obstruction or adynamic ileus.  Musculoskeletal:  No fractures.  IMPRESSION: 1. Large right inferior gluteal region soft tissue hematoma/contusion. There is no proximal femur or pelvic fracture, over. 2. No other acute finding. 3. Multiple chronic findings as detailed. 4. Diffuse dilation throughout the abdominal aorta, increased in caliber when compared to the prior CT. No evidence of abdominal aortic rupture.   Electronically Signed   By: Lajean Manes M.D.   On: 09/28/2014 10:32   Ct Head Wo Contrast  09/28/2014   CLINICAL DATA:  Lives at home. Urinary tract infection symptoms. Multiple falls, sharp neck pain and stiffness,  headache and weakness.  EXAM: CT HEAD WITHOUT CONTRAST  CT CERVICAL SPINE WITHOUT CONTRAST  TECHNIQUE: Multidetector CT imaging of the head and cervical spine was performed following the standard protocol without intravenous contrast. Multiplanar CT image reconstructions of the cervical spine were also generated.  COMPARISON:  MRI of the brain report August 14, 2000 though images are not available for direct comparison.  FINDINGS: CT HEAD FINDINGS  No intraparenchymal hemorrhage, mass effect nor midline shift. Patchy supratentorial white matter hypodensities are within normal range for patient's age and though non-specific suggest sequelae of chronic small vessel ischemic disease. No acute large vascular territory infarcts. LEFT occipital lobe encephalomalacia with ex vacuo dilatation of LEFT occipital horn. Ventricles and sulci are otherwise normal for patient's age.  No abnormal extra-axial fluid collections. Basal cisterns are patent. Moderate calcific atherosclerosis of the carotid siphons.  No  skull fracture. The included ocular globes and orbital contents are non-suspicious. The mastoid aircells and included paranasal sinuses are well-aerated. Patient is edentulous.  CT CERVICAL SPINE FINDINGS  Cervical vertebral bodies and posterior elements intact. Straightened cervical lordosis. Large C7 superior endplate Schmorl's node with additional smaller Schmorl's nodes evident. Moderate C3-4 disc height loss, uncovertebral hypertrophy and at ventral endplate spurring consistent with degenerative disc, moderate C4-5, C5-6 and C6-7. C1-2 articulation maintained with mild arthropathy. Moderate calcific atherosclerosis of the carotid bulbs, without acute findings.  Mild osseous canal stenosis C4-5, C5-6.  IMPRESSION: CT HEAD: No acute intracranial process.  Remote LEFT posterior cerebral artery territory infarct. Mild to moderate white matter changes likely represent chronic small vessel ischemic disease.  CT CERVICAL SPINE: Straightened cervical lordosis without acute fracture nor malalignment.   Electronically Signed   By: Elon Alas   On: 09/28/2014 04:28   Ct Cervical Spine Wo Contrast  09/28/2014   CLINICAL DATA:  Lives at home. Urinary tract infection symptoms. Multiple falls, sharp neck pain and stiffness, headache and weakness.  EXAM: CT HEAD WITHOUT CONTRAST  CT CERVICAL SPINE WITHOUT CONTRAST  TECHNIQUE: Multidetector CT imaging of the head and cervical spine was performed following the standard protocol without intravenous contrast. Multiplanar CT image reconstructions of the cervical spine were also generated.  COMPARISON:  MRI of the brain report August 14, 2000 though images are not available for direct comparison.  FINDINGS: CT HEAD FINDINGS  No intraparenchymal hemorrhage, mass effect nor midline shift. Patchy supratentorial white matter hypodensities are within normal range for patient's age and though non-specific suggest sequelae of chronic small vessel ischemic disease. No acute large  vascular territory infarcts. LEFT occipital lobe encephalomalacia with ex vacuo dilatation of LEFT occipital horn. Ventricles and sulci are otherwise normal for patient's age.  No abnormal extra-axial fluid collections. Basal cisterns are patent. Moderate calcific atherosclerosis of the carotid siphons.  No skull fracture. The included ocular globes and orbital contents are non-suspicious. The mastoid aircells and included paranasal sinuses are well-aerated. Patient is edentulous.  CT CERVICAL SPINE FINDINGS  Cervical vertebral bodies and posterior elements intact. Straightened cervical lordosis. Large C7 superior endplate Schmorl's node with additional smaller Schmorl's nodes evident. Moderate C3-4 disc height loss, uncovertebral hypertrophy and at ventral endplate spurring consistent with degenerative disc, moderate C4-5, C5-6 and C6-7. C1-2 articulation maintained with mild arthropathy. Moderate calcific atherosclerosis of the carotid bulbs, without acute findings.  Mild osseous canal stenosis C4-5, C5-6.  IMPRESSION: CT HEAD: No acute intracranial process.  Remote LEFT posterior cerebral artery territory infarct. Mild to moderate white matter changes likely represent chronic small vessel ischemic  disease.  CT CERVICAL SPINE: Straightened cervical lordosis without acute fracture nor malalignment.   Electronically Signed   By: Elon Alas   On: 09/28/2014 04:28   Dg Chest Portable 1 View  09/28/2014   CLINICAL DATA:  Acute onset of bilateral sharp neck pain and stiffness. Multiple falls. Increased urinary frequency and hypotension. Initial encounter.  EXAM: PORTABLE CHEST - 1 VIEW  COMPARISON:  Chest radiograph performed 11/27/2013  FINDINGS: The lungs are well-aerated. Mild vascular congestion is noted. There is no evidence of focal opacification, pleural effusion or pneumothorax.  The cardiomediastinal silhouette is borderline normal in size. No acute osseous abnormalities are seen.  IMPRESSION: Mild  vascular congestion noted; lungs remain grossly clear. No displaced rib fracture seen.   Electronically Signed   By: Garald Balding M.D.   On: 09/28/2014 03:55    Scheduled Meds: . antiseptic oral rinse  7 mL Mouth Rinse BID  . levothyroxine  25 mcg Oral QHS  . piperacillin-tazobactam (ZOSYN)  IV  3.375 g Intravenous 3 times per day  . sodium chloride  3 mL Intravenous Q12H  . vancomycin  1,000 mg Intravenous Q24H   Continuous Infusions: . sodium chloride 70 mL/hr at 09/28/14 2155   Assessment and plan:  Principal Problem:   Sepsis associated hypotension Active Problems:   Volume depletion   Recurrent UTI   Lactic acidosis   Traumatic hematoma of buttock   CAD (coronary artery disease)   PAF (paroxysmal atrial fibrillation)   Acute kidney injury   CKD (chronic kidney disease) stage 3, GFR 30-59 ml/min   Thoracoabdominal aortic aneurysm   Acute blood loss anemia   History of CVA (cerebrovascular accident)   Cognitive decline   Dementia   Hypothyroidism   Urinary retention    1. Sepsis with lactic acidosis and hypotension, likely secondary to UTI. Blood cultures and a urine culture ordered in the emergency department. She was started on IV Zosyn and vancomycin empirically. Gentle IV fluids were started for volume depletion and prerenal azotemia. Her lactic acid level has normalized. We'll continue antibiotic therapy and supportive treatment for now. We'll narrow antibiotics based on culture results when available.  Recurrent urinary tract infection. Treatment as above. We are awaiting the results of the urine culture.  Urinary retention. The patient complained of the inability to urinate spontaneously all 4/2. A Foley catheter was inserted and she drained out more than 700 cc of urine. We'll continue to monitor and will initiate a voiding trial in a few days.  Large right gluteal/buttock contusion and hematoma; status post fall at home. CT of the abdomen and pelvis was  ordered for evaluation and he confirmed a large right inferior gluteal hematoma/contusion, but no evidence of femur or pelvic fracture. Her hemoglobin has drifted down 3 g over the past 24 hours; we'll therefore transfuse her 1 unit with keeping one to 2 units ahead. Subcutaneous heparin and Plavix were discontinued. We'll treat her pain supportively.  Acute blood loss anemia; secondary to gluteal hematoma. The patient is treated chronically with Plavix for atrial fibrillation. It was discontinued this morning. Subcutaneous heparin was also discontinued and she was placed on SCDs for DVT prophylaxis. Her hemoglobin was 11.3 on admission. It has drifted down to 8.2. We'll transfuse her 1 unit of packed red blood cells. We'll continue to monitor her CBC daily.  Thrombocytopenia. The patient's platelet count was 150 on admission. It has drifted down to 97. This is likely consumption related. As stated above, subcutaneous heparin  and Plavix were discontinued for now. We'll continue to monitor her CBC daily. Her vitamin B12 level  and TSH were within normal limits.  Acute kidney injury, superimposed on stage III chronic kidney disease. The patient's baseline creatinine based on chart review is 1.6 to 2.0. It was 2.32 on admission. Etiology was likely prerenal azotemia in the setting of hematoma, infection, and poor oral intake. We'll continue gentle IV fluids with caution given her history of CHF.  Paroxysmal atrial fibrillation. The patient is on Plavix for antiplatelet therapy. According to the medication review, the patient is no longer on metoprolol. She takes Norvasc chronically for treatment of hypertension. Her heart rate has been borderline elevated. Will not start Norvasc, but will start small doses of diltiazem if her blood pressure remains above 166 systolically.  Hypertension. The patient was relative hypotensive on admission from sepsis involving depletion Norvasc was withheld. Her  blood pressure has improved.  Known history of thoracoabdominal aortic aneurysm. CT of her abdomen and pelvis revealed a 6.4 x 6 cm the thoracoabdominal aortic aneurysm. Per the radiologist, it previously measured 4.2 cm. The patient was evaluated by cardiothoracic surgeon Dr. Caffie Pinto in 2012. Purchase assessment, the patient was not a candidate for operative repair given her comorbid conditions. We will continue to monitor her. Hypertension does not appear to be an issue right now.  CAD and history of congestive heart failure. Currently stable and compensated. Her 2-D echocardiogram in May 2011 revealed an EF of 55-65% and grade 1 diastolic dysfunction.  Chronic dementia with cognitive decline. We'll treat as needed confusion supportively; will try to hold off on giving her antipsychotic medications.  ? History of hypothyroidism. Per pharmacy, the patient no longer takes Synthroid. We'll confirm this with her family. Her TSH was within normal limits. We'll stop Synthroid for now.  Time spent: 45 minutes    [facility] Pager [phone]. If 7PM-7AM, please contact night-coverage at [email or web address], password [facility] 09/29/2014, 8:32 AM  LOS: 1 day

## 2014-09-30 LAB — CBC
HCT: 27.1 % — ABNORMAL LOW (ref 36.0–46.0)
HCT: 25.7 % — ABNORMAL LOW (ref 36.0–46.0)
HEMOGLOBIN: 8.8 g/dL — AB (ref 12.0–15.0)
Hemoglobin: 8.5 g/dL — ABNORMAL LOW (ref 12.0–15.0)
MCH: 30.9 pg (ref 26.0–34.0)
MCH: 31.4 pg (ref 26.0–34.0)
MCHC: 32.5 g/dL (ref 30.0–36.0)
MCHC: 33.1 g/dL (ref 30.0–36.0)
MCV: 95.1 fL (ref 78.0–100.0)
MCV: 94.8 fL (ref 78.0–100.0)
PLATELETS: 120 10*3/uL — AB (ref 150–400)
PLATELETS: 116 10*3/uL — AB (ref 150–400)
RBC: 2.85 MIL/uL — AB (ref 3.87–5.11)
RBC: 2.71 MIL/uL — AB (ref 3.87–5.11)
RDW: 16.2 % — ABNORMAL HIGH (ref 11.5–15.5)
RDW: 16.3 % — AB (ref 11.5–15.5)
WBC: 4.8 10*3/uL (ref 4.0–10.5)
WBC: 4.7 10*3/uL (ref 4.0–10.5)

## 2014-09-30 LAB — URINE CULTURE
COLONY COUNT: NO GROWTH
Culture: NO GROWTH

## 2014-09-30 LAB — TYPE AND SCREEN
ABO/RH(D): O POS
Antibody Screen: NEGATIVE
UNIT DIVISION: 0

## 2014-09-30 LAB — COMPREHENSIVE METABOLIC PANEL
ALBUMIN: 3 g/dL — AB (ref 3.5–5.2)
ALK PHOS: 30 U/L — AB (ref 39–117)
ALT: 12 U/L (ref 0–35)
AST: 32 U/L (ref 0–37)
Anion gap: 7 (ref 5–15)
BUN: 16 mg/dL (ref 6–23)
CALCIUM: 8 mg/dL — AB (ref 8.4–10.5)
CO2: 25 mmol/L (ref 19–32)
CREATININE: 1.78 mg/dL — AB (ref 0.50–1.10)
Chloride: 105 mmol/L (ref 96–112)
GFR calc Af Amer: 30 mL/min — ABNORMAL LOW (ref >90)
GFR calc non Af Amer: 26 mL/min — ABNORMAL LOW (ref >90)
GLUCOSE: 88 mg/dL (ref 70–99)
Potassium: 3.8 mmol/L (ref 3.5–5.1)
Sodium: 137 mmol/L (ref 135–145)
Total Bilirubin: 0.5 mg/dL (ref 0.3–1.2)
Total Protein: 5.7 g/dL — ABNORMAL LOW (ref 6.0–8.3)

## 2014-09-30 MED ORDER — FUROSEMIDE 20 MG PO TABS
20.0000 mg | ORAL_TABLET | Freq: Every day | ORAL | Status: DC
  Administered 2014-09-30 – 2014-10-03 (×4): 20 mg via ORAL
  Filled 2014-09-30 (×4): qty 1

## 2014-09-30 NOTE — Evaluation (Signed)
Physical Therapy Evaluation Patient Details Name: Brandy Mcguire MRN: [id number] DOB: 1934/01/27 Today's Date: 09/30/2014   History of Present Illness  Brandy Mcguire is an 81 y.o. female with hx of dementia, hx of CHF, HTN, CAD s/p prior MI, hypothyroidism, chronic afib managed by cardiology, not on anticoagulation due to her thoracic aneurysm (per cardiology's office note), lives at home with her son, brought to the ER after a fall. She did not lose consciousness. She was noted to be more confused tonight than her baseline dementia. In the ER, she was found to have hypotension, with SBP 80's, tachycardic, and found to have elevated lactic acid level of 4.0. Sepsis work up was done, but no definite source was found, to include a negative UA, negative CXR for infiltrate, though some vascular congestion was found. Her WBC was not elevated, and she had no fever. She was given 2.5 L of IVF bolus, and her SBP was about 100. She also had a negative head and cervical CT. When I saw her, she was alert and knows he is in the hospital. She converse meaningfully. Her Cr was found to be elevated to 2.32, with bicarb of 27. Hospitalist was asked to admit her for possible sepsis of unclear source, with volume depletion and AKI, along with transient altered mental status.   Clinical Impression   Pt lives with her son and normally uses a rolling walker.  She has had frequent falls at home, usually when she forgets to use her walker.  She fell at home PTA and sustained a significant hematoma to posterior thighs and buttock.  This makes sitting very painful for her.  She is back to her baseline cognition, I believe, and was very cooperative and able to follow directions.  She was found to be generally weak and deconditioned from recent illness.  Transfers are quite labored and upon standing she tends to fall backward.  Gait with a walker was unstable and she is very fearful of falling when upright.  I  believe that she would benefit from SNF at d/c.  If she goes home, she will need very close supervision, especially when trying to walk.    Follow Up Recommendations SNF    Equipment Recommendations  None recommended by PT    Recommendations for Other Services   OT    Precautions / Restrictions Precautions Precautions: Fall Restrictions Weight Bearing Restrictions: No      Mobility  Bed Mobility Overal bed mobility: Needs Assistance Bed Mobility: Supine to Sit     Supine to sit: Supervision     General bed mobility comments: transfer is labored  Transfers Overall transfer level: Needs assistance Equipment used: Rolling walker (2 wheeled) Transfers: Sit to/from Stand Sit to Stand: Supervision            Ambulation/Gait Ambulation/Gait assistance: Min assist Ambulation Distance (Feet): 18 Feet Assistive device: Rolling walker (2 wheeled) Gait Pattern/deviations: Trunk flexed;Decreased stride length   Gait velocity interpretation: Below normal speed for age/gender General Gait Details: loses balance backward intermittently and needs stabilization by PT...pt states that she is very afraid of falling                       Balance Overall balance assessment: Needs assistance Sitting-balance support: No upper extremity supported;Feet supported Sitting balance-Leahy Scale: Good     Standing balance support: No upper extremity supported;During functional activity Standing balance-Leahy Scale: Poor Standing balance comment: standing balance with walker is fair  Pertinent Vitals/Pain Pain Assessment: No/denies pain    Home Living Family/patient expects to be discharged to:: Assisted living Living Arrangements: Children             Home Equipment: Walker - 2 wheels Additional Comments: pt is unable to give history due to dementia    Prior Function           Comments: unknown...she apparently  ambulates with a rolling walker             Extremity/Trunk Assessment   Upper Extremity Assessment: Generalized weakness           Lower Extremity Assessment: Generalized weakness (deconditioned)      Cervical / Trunk Assessment: Kyphotic  Communication   Communication: No difficulties  Cognition Arousal/Alertness: Awake/alert Behavior During Therapy: WFL for tasks assessed/performed Overall Cognitive Status: History of cognitive impairments - at baseline                                    Assessment/Plan    PT Assessment Patient needs continued PT services  PT Diagnosis Difficulty walking;Abnormality of gait;Generalized weakness   PT Problem List Decreased strength;Decreased activity tolerance;Decreased balance;Decreased mobility;Decreased safety awareness  PT Treatment Interventions Gait training;Functional mobility training;Therapeutic exercise;Balance training   PT Goals (Current goals can be found in the Care Plan section) Acute Rehab PT Goals Patient Stated Goal: none stated PT Goal Formulation: With patient Time For Goal Achievement: 10/14/14 Potential to Achieve Goals: Good    Frequency Min 3X/week   Barriers to discharge   none known                   End of Session Equipment Utilized During Treatment: Gait belt Activity Tolerance: Patient tolerated treatment well Patient left: in chair;with nursing/sitter in room (pt in w/c to transfer to room 319) Nurse Communication: Mobility status         Time: 8889-1694 PT Time Calculation (min) (ACUTE ONLY): 37 min   Charges:   PT Evaluation $Initial PT Evaluation Tier I: 1 Procedure     PT G CodesDemetrios Isaacs L 09/30/2014, 2:37 PM

## 2014-09-30 NOTE — Care Management Utilization Note (Signed)
UR completed 

## 2014-09-30 NOTE — Progress Notes (Signed)
[facility] PROGRESS NOTE  Brandy Mcguire OAC:[id number] DOB: 1933/10/30 DOA: 09/28/2014 PCP: Brandy Kilts, MD    Code Status: Full code Family Communication: Family not available; will discuss with son when he arrives. Disposition Plan: Discharge when clinically appropriate.   Consultants:  None  Procedures:   /3/16-Packed red blood cell transfusion  1  Antibiotics:  Zosyn 4/2>>  Vancomycin 4/2>>  HPI/Subjective: The patient  Is sitting up in a chair eating breakfast. She has no complaints. The right buttock is less sore.  Objective: Filed Vitals:   09/30/14 0818  BP:   Pulse:   Temp: 97.3 F (36.3 C)  Resp:    pulse 96-104. respiratory rate 19. Blood pressure 149/135. Oxygen saturation 97% on room air.  Intake/Output Summary (Last 24 hours) at 09/30/14 1045 Last data filed at 09/30/14 0600  Gross per 24 hour  Intake   2785 ml  Output   2650 ml  Net    135 ml   Filed Weights   09/28/14 0630 09/29/14 0500 09/30/14 0500  Weight: 80 kg (176 lb 5.9 oz) 83.4 kg (183 lb 13.8 oz) 83 kg (182 lb 15.7 oz)    Exam:   General:  Elderly slightly confused 81 year old woman in no acute distress.  Cardiovascular: Irregular, irregular, with borderline tachycardia.  Respiratory: Occasional crackles;, partially cleared with coughing breathing is nonlabored  Abdomen: Positive bowel sounds, soft, nontender, nondistended.  Musculoskeletal/extremities:  Decrease in size of massively large right buttock hematoma ; with significant  dependent surrounding ecchymosis. No pedal edema. No other acute hot red joints.  Neurologic/psychiatric: The patient is alert and oriented to herself. She is forgetful and does not recall that she is in the hospital. However, she follows directions well. Cranial nerves II through XII are grossly intact.  Data Reviewed: Basic Metabolic Panel:  Recent Labs Lab 09/28/14 0309 09/28/14 0659 09/29/14 0404 09/30/14 0514  NA 134*   --  137 137  K 4.1  --  4.0 3.8  CL 97  --  107 105  CO2 27  --  23 25  GLUCOSE 96  --  91 88  BUN 20  --  18 16  CREATININE 2.32* 2.22* 2.03* 1.78*  CALCIUM 8.6  --  7.6* 8.0*   Liver Function Tests:  Recent Labs Lab 09/29/14 0404 09/30/14 0514  AST 38* 32  ALT 12 12  ALKPHOS 29* 30*  BILITOT 0.7 0.5  PROT 5.7* 5.7*  ALBUMIN 3.1* 3.0*   No results for input(s): LIPASE, AMYLASE in the last 168 hours. No results for input(s): AMMONIA in the last 168 hours. CBC:  Recent Labs Lab 09/28/14 0309 09/28/14 0659 09/29/14 0404 09/29/14 1218 09/29/14 2026 09/30/14 0826  WBC 9.0 8.6 5.6  --  5.6 4.8  NEUTROABS 6.7  --   --   --   --   --   HGB 11.3* 9.8* 8.2* 7.7* 9.3* 8.8*  HCT 35.1* 30.4* 25.8* 23.7* 28.5* 27.1*  MCV 95.9 95.9 98.1  --  94.7 95.1  PLT 150 135* 97*  --  109* 120*   Cardiac Enzymes:  Recent Labs Lab 09/28/14 0309  CKTOTAL 188*   BNP (last 3 results) No results for input(s): BNP in the last 8760 hours.  ProBNP (last 3 results)  Recent Labs  11/27/13 2255  PROBNP 4215.0*    CBG: No results for input(s): GLUCAP in the last 168 hours.  Recent Results (from the past 240 hour(s))  Blood culture (routine x 2)  Status: None (Preliminary result)   Collection Time: 09/28/14  3:09 AM  Result Value Ref Range Status   Specimen Description LEFT ANTECUBITAL  Final   Special Requests   Final    BOTTLES DRAWN AEROBIC AND ANAEROBIC 6CC DRAWN BY RN   Culture NO GROWTH 2 DAYS  Final   Report Status PENDING  Incomplete  Blood culture (routine x 2)     Status: None (Preliminary result)   Collection Time: 09/28/14  3:14 AM  Result Value Ref Range Status   Specimen Description BLOOD LEFT HAND  Final   Special Requests BOTTLES DRAWN AEROBIC ONLY 5CC  Final   Culture NO GROWTH 2 DAYS  Final   Report Status PENDING  Incomplete  Urine culture     Status: None   Collection Time: 09/28/14  3:33 AM  Result Value Ref Range Status   Specimen Description  URINE, CATHETERIZED  Final   Special Requests NONE  Final   Colony Count NO GROWTH Performed at [facility]   Final   Culture NO GROWTH Performed at [facility]   Final   Report Status 09/30/2014 FINAL  Final  MRSA PCR Screening     Status: None   Collection Time: 09/28/14  6:14 AM  Result Value Ref Range Status   MRSA by PCR NEGATIVE NEGATIVE Final    Comment:        The GeneXpert MRSA Assay (FDA approved for NASAL specimens only), is one component of a comprehensive MRSA colonization surveillance program. It is not intended to diagnose MRSA infection nor to guide or monitor treatment for MRSA infections.      Studies: No results found.  Scheduled Meds: . sodium chloride   Intravenous Once  . antiseptic oral rinse  7 mL Mouth Rinse BID  . diltiazem  30 mg Oral 3 times per day  . feeding supplement (ENSURE ENLIVE)  237 mL Oral BID BM  . piperacillin-tazobactam (ZOSYN)  IV  3.375 g Intravenous 3 times per day  . sodium chloride  3 mL Intravenous Q12H  . vancomycin  1,000 mg Intravenous Q24H   Continuous Infusions: . sodium chloride 50 mL/hr at 09/30/14 0430   Assessment and plan:  Principal Problem:   Sepsis associated hypotension Active Problems:   Volume depletion   Recurrent UTI   Lactic acidosis   Traumatic hematoma of buttock   CAD (coronary artery disease)   PAF (paroxysmal atrial fibrillation)   Acute kidney injury   CKD (chronic kidney disease) stage 3, GFR 30-59 ml/min   Thoracoabdominal aortic aneurysm   Acute blood loss anemia   History of CVA (cerebrovascular accident)   Cognitive decline   Dementia   Hypothyroidism   Urinary retention    1. Sepsis with lactic acidosis and hypotension, likely secondary to UTI. Blood cultures and a urine culture ordered in the emergency department. She was started on IV Zosyn and vancomycin empirically. Gentle IV fluids were started for volume depletion and prerenal azotemia. Her lactic  acid level which was elevated on admission has normalized. Her blood cultures and urine culture are negative date , so we'll discontinue vancomycin. We'll continue Zosyn for 1 more day before transitioning her to an oral equivalent.  Recurrent urinary tract infection. Treatment as above in #1. Cultures are negative to date.  Urinary retention. The patient complained of the inability to urinate spontaneously all 4/2. A Foley catheter was inserted and she drained out more than 700 cc of urine. We'll continue to  monitor and will initiate a voiding trial in the next 24 hours.  Large right gluteal/buttock contusion and hematoma; status post fall at home. CT of the abdomen and pelvis was ordered for evaluation and  it confirmed a large right inferior gluteal hematoma/contusion, but no evidence of femur or pelvic fracture. Her hemoglobin had drifted down 3 g. She was transfused 1 unit of packed red blood cells on 4/3 with improvement. Subcutaneous heparin and Plavix were discontinued. The extent of the hematoma is decreasing. We'll continue to monitor her closely for the need for additional transfusions.  Acute blood loss anemia; secondary to gluteal hematoma. The patient is treated chronically with Plavix for atrial fibrillation. It was discontinued on 4/3 /16.. Subcutaneous heparin was also discontinued and she was placed on SCDs for DVT prophylaxis. Her hemoglobin was 11.3 on admission. It has drifted down to  7.7. She was transfused 1 unit of packed rib blood cells with improvement in her hemoglobin to 9.3. It has drifted down a little, but will hold on further transfusion for now. We'll continue to monitor her  H/H.  Thrombocytopenia. The patient's platelet count was 150 on admission. It has drifted down to 97. This  was likely consumption related. As stated above, subcutaneous heparin and Plavix were discontinued for now. We'll continue to monitor her CBC daily. Her vitamin B12 level  and TSH  were within normal limits.  Acute kidney injury, superimposed on stage III chronic kidney disease. The patient's baseline creatinine based on chart review is 1.6 to 2.0. It was 2.32 on admission. Etiology was likely prerenal azotemia in the setting of hematoma, infection, and poor oral intake. She was started on gentle IV fluids with caution given her history of CHF. Her creatinine has improved.  Paroxysmal atrial fibrillation. The patient is on Plavix for antiplatelet therapy. According to the medication review, the patient is no longer on metoprolol. She takes Norvasc chronically for treatment of hypertension. Her heart rate has been borderline elevated.  Norvasc was discontinued in favor of but will start small doses of diltiazem.  Hypertension. The patient was relative hypotensive on admission from sepsis and hypovolemia. Norvasc was withheld. Her blood pressure has improved. She was started on diltiazem.  Known history of thoracoabdominal aortic aneurysm. CT of her abdomen and pelvis revealed a 6.4 x 6 cm the thoracoabdominal aortic aneurysm. Per the radiologist, it previously measured 4.2 cm. The patient was evaluated by cardiothoracic surgeon Dr. Caffie Pinto in 2012. [place] assessment, the patient was not a candidate for operative repair given her comorbid conditions. We will continue to monitor her. Hypertension does not appear to be an issue right now.  CAD and history of congestive heart failure. Currently stable and compensated. Her 2-D echocardiogram in May 2011 revealed an EF of 55-65% and grade 1 diastolic dysfunction.  Chronic dementia with cognitive decline. We'll treat as needed confusion supportively; will try to hold off on giving her antipsychotic medications.  ? History of hypothyroidism. Per pharmacy, the patient no longer takes Synthroid. We'll confirm this with her family. Her TSH was within normal limits. We'll stop Synthroid for now.  Time spent:  30  minutes    [facility] Pager [phone]. If 7PM-7AM, please contact night-coverage at [email or web address], password [facility] 09/30/2014, 10:45 AM  LOS: 2 days

## 2014-09-30 NOTE — Care Management Note (Addendum)
    Page 1 of 1   10/03/2014     1:27:13 PM CARE MANAGEMENT NOTE 10/03/2014  Patient:  Brandy Mcguire, Brandy Mcguire   Account Number:  [id number]  Date Initiated:  09/30/2014  Documentation initiated by:  Jolene Provost  Subjective/Objective Assessment:   Pt is from home, lives with son who works at her [employer]. Pt has  walker for PRN use but no other DME's. Pt has recommended SNF at discharge. CSW pending, will cont to follow.     Action/Plan:   Anticipated DC Date:  10/02/2014   Anticipated DC Plan:  SKILLED NURSING FACILITY  In-house referral  Clinical Social Worker      DC Planning Services  CM consult      Choice offered to / List presented to:             Status of service:  Completed, signed off Medicare Important Message given?  YES (If response is "NO", the following Medicare IM given date fields will be blank) Date Medicare IM given:  10/03/2014 Medicare IM given by:  Theophilus Kinds Date Additional Medicare IM given:   Additional Medicare IM given by:    Discharge Disposition:  [facility]  Per UR Regulation:  Reviewed for med. necessity/level of care/duration of stay  If discussed at [place] of Stay Meetings, dates discussed:   10/03/2014    Comments:  10/03/14 [place], RN BSN CM Pt discharged to [facility] today. CSW to arrange discharge to facility.  09/30/2014 [place], RN, MSN, CM

## 2014-09-30 NOTE — Progress Notes (Signed)
Called report to Cecilie Kicks, RN on dept 300. Verbalized understanding.  Pt transferred to room 319 in safe and stable condition. Sch[place], Eulis Canner 09/30/2014

## 2014-09-30 NOTE — Progress Notes (Signed)
Patient slept some with the addition of PTA med list sleeping medication.  Patient still stays up at night and does the same at home according to the patient and son.  The patient was still confused throughout the shift but easily reoriented.

## 2014-10-01 ENCOUNTER — Inpatient Hospital Stay (HOSPITAL_COMMUNITY): Payer: Medicare Other

## 2014-10-01 DIAGNOSIS — R4189 Other symptoms and signs involving cognitive functions and awareness: Secondary | ICD-10-CM

## 2014-10-01 DIAGNOSIS — R339 Retention of urine, unspecified: Secondary | ICD-10-CM

## 2014-10-01 LAB — CBC
HCT: 24.9 % — ABNORMAL LOW (ref 36.0–46.0)
HCT: 24.4 % — ABNORMAL LOW (ref 36.0–46.0)
HEMATOCRIT: 24.4 % — AB (ref 36.0–46.0)
HEMOGLOBIN: 8 g/dL — AB (ref 12.0–15.0)
Hemoglobin: 8.1 g/dL — ABNORMAL LOW (ref 12.0–15.0)
Hemoglobin: 8.1 g/dL — ABNORMAL LOW (ref 12.0–15.0)
MCH: 31.5 pg (ref 26.0–34.0)
MCH: 31.2 pg (ref 26.0–34.0)
MCH: 31.4 pg (ref 26.0–34.0)
MCHC: 33.2 g/dL (ref 30.0–36.0)
MCHC: 32.5 g/dL (ref 30.0–36.0)
MCHC: 32.8 g/dL (ref 30.0–36.0)
MCV: 94.9 fL (ref 78.0–100.0)
MCV: 95.8 fL (ref 78.0–100.0)
MCV: 95.7 fL (ref 78.0–100.0)
PLATELETS: 133 10*3/uL — AB (ref 150–400)
PLATELETS: 132 10*3/uL — AB (ref 150–400)
Platelets: 127 10*3/uL — ABNORMAL LOW (ref 150–400)
RBC: 2.57 MIL/uL — ABNORMAL LOW (ref 3.87–5.11)
RBC: 2.6 MIL/uL — ABNORMAL LOW (ref 3.87–5.11)
RBC: 2.55 MIL/uL — AB (ref 3.87–5.11)
RDW: 16.2 % — ABNORMAL HIGH (ref 11.5–15.5)
RDW: 16.2 % — ABNORMAL HIGH (ref 11.5–15.5)
RDW: 16.3 % — ABNORMAL HIGH (ref 11.5–15.5)
WBC: 5.1 10*3/uL (ref 4.0–10.5)
WBC: 5 10*3/uL (ref 4.0–10.5)
WBC: 5.5 10*3/uL (ref 4.0–10.5)

## 2014-10-01 LAB — CLOSTRIDIUM DIFFICILE BY PCR: Toxigenic C. Difficile by PCR: NEGATIVE

## 2014-10-01 LAB — BASIC METABOLIC PANEL
Anion gap: 7 (ref 5–15)
BUN: 13 mg/dL (ref 6–23)
CHLORIDE: 103 mmol/L (ref 96–112)
CO2: 29 mmol/L (ref 19–32)
CREATININE: 1.8 mg/dL — AB (ref 0.50–1.10)
Calcium: 8 mg/dL — ABNORMAL LOW (ref 8.4–10.5)
GFR calc Af Amer: 30 mL/min — ABNORMAL LOW (ref >90)
GFR calc non Af Amer: 26 mL/min — ABNORMAL LOW (ref >90)
GLUCOSE: 89 mg/dL (ref 70–99)
POTASSIUM: 3.4 mmol/L — AB (ref 3.5–5.1)
SODIUM: 139 mmol/L (ref 135–145)

## 2014-10-01 LAB — AMMONIA: AMMONIA: 9 umol/L — AB (ref 11–32)

## 2014-10-01 MED ORDER — POTASSIUM CHLORIDE CRYS ER 20 MEQ PO TBCR
20.0000 meq | EXTENDED_RELEASE_TABLET | Freq: Two times a day (BID) | ORAL | Status: AC
  Administered 2014-10-01 (×2): 20 meq via ORAL
  Filled 2014-10-01 (×2): qty 1

## 2014-10-01 MED ORDER — LEVOFLOXACIN 250 MG PO TABS
250.0000 mg | ORAL_TABLET | Freq: Every day | ORAL | Status: DC
  Administered 2014-10-02 – 2014-10-03 (×2): 250 mg via ORAL
  Filled 2014-10-01 (×2): qty 1

## 2014-10-01 NOTE — Clinical Social Work Psychosocial (Signed)
Clinical Social Work Department BRIEF PSYCHOSOCIAL ASSESSMENT 10/01/2014  Patient:  Brandy Mcguire, Brandy Mcguire     Account Number:  [id number]     Admit date:  09/28/2014  Clinical Social Worker:  Legrand Como  Date/Time:  10/01/2014 01:49 PM  Referred by:  CSW  Date Referred:  10/01/2014 Referred for  SNF Placement   Other Referral:   Interview type:  Patient Other interview type:   Daughter, Brandy Mcguire    PSYCHOSOCIAL DATA Living Status:  [place] Admitted from facility:   Level of care:   Primary support name:  Brandy Mcguire Primary support relationship to patient:  CHILD, ADULT Degree of support available:   Patient's son is very supportive. Patient lives with son, Brandy Mcguire, and daughter in law.    CURRENT CONCERNS Current Concerns  Post-Acute Placement   Other Concerns:    SOCIAL WORK ASSESSMENT / PLAN CSW met with patient. Patient was oriented mainly to self. She did indicate that she resides with her son Brandy Mcguire.  Patient indicated that Brandy Mcguire takes care of her. (CSW attempted to contact patient's son Brandy Mcguire but the phone rang and beeped repeatedly).  CSW contacted patient's daughter Brandy Mcguire. Brandy Mcguire confirmed patient's statements. She indicated that at baseline patient ambulates with a walker. She stated that patient is able to use the bathroom independently. She stated that patient's daughter in law who lives in the home assist her with bathing.  CSW discussed SNF recommendation.  Brandy Mcguire indicated that she would prefer if patient stayed in [place] so that family could visit with her. She indicated that her first choice was [facility] and if they had no availability then they would want her at [facility] as it was also in [place].   Assessment/plan status:  Information/Referral to [employer] Other assessment/ plan:   Information/referral to community resources:    PATIENT'S/FAMILY'S RESPONSE TO PLAN OF CARE: Patient's daughter prefers patient  to go to a SNF in [place]. Her first choice was [facility]. Her second choice is [facility].   Ambrose Pancoast, [place]

## 2014-10-01 NOTE — Progress Notes (Signed)
PT Cancellation Note  Patient Details Name: Brandy Mcguire MRN: [id number] DOB: 07-Jan-1934   Cancelled Treatment:    Reason Eval/Treat Not Completed: Patient at procedure or test/unavailable PT session attempted, pt getting Folic catheter and not available at this time.  [address] [place]; [place] [id number] [phone]  Aldona Lento 10/01/2014, 1:23 PM

## 2014-10-01 NOTE — Progress Notes (Signed)
[facility] PROGRESS NOTE  Brandy Mcguire ZWC:[id number] DOB: 08-Apr-1934 DOA: 09/28/2014 PCP: [name], MD    Code Status: Full code Family Communication: Discussed with son and daughter on 09/30/14 Disposition Plan: Discharge when clinically appropriate; PT is recommending SNF.   Consultants:  Urology consult if urinary retention returns.  Procedures:   09/29/14-transfusion of 1 unit of packed red blood cells.  Antibiotics:  Levaquin 4/6>>  Zosyn 4/2>> 4/5  Vancomycin 4/2>>4/4  HPI/Subjective: The patient is noticeably more confused above baseline; she is agitated and trying to get out of bed to the chair. Foley catheter was discontinued this morning for a voiding trial. She is incontinent of loose brown stool.   Objective: Filed Vitals:   10/01/14 1107  BP: 104/70  Pulse: 90  Temp: 97.7 F (36.5 C)  Resp: 22   oxygen saturation 95% on room air  Intake/Output Summary (Last 24 hours) at 10/01/14 1305 Last data filed at 10/01/14 0930  Gross per 24 hour  Intake 1411.67 ml  Output   1650 ml  Net -238.33 ml   Filed Weights   09/28/14 0630 09/29/14 0500 09/30/14 0500  Weight: 80 kg (176 lb 5.9 oz) 83.4 kg (183 lb 13.8 oz) 83 kg (182 lb 15.7 oz)    Exam:   General:  Elderly woman more confused and agitated above the baseline.  Cardiovascular: Irregular, irregular  Respiratory: Occasional fine crackles.  Abdomen: Positive bowel sounds, soft, nontender, nondistended.  Musculoskeletal/extremities:  Decrease in size of previously massively large right buttock hematoma ; with significant  dependent surrounding ecchymosis. No pedal edema. No other acute hot red joints.  Rectum: She is sitting in a large amount of loose brown stool in the chair.  Neurologic/psychiatric: The patient is more agitated and more confused today; her speech is noncoherent, different from yesterday. She attempts to follow small commands. No obvious facial droop or slurred  speech. She is oriented to herself but not the place.  Data Reviewed: Basic Metabolic Panel:  Recent Labs Lab 09/28/14 0309 09/28/14 0659 09/29/14 0404 09/30/14 0514 10/01/14 0604  NA 134*  --  137 137 139  K 4.1  --  4.0 3.8 3.4*  CL 97  --  107 105 103  CO2 27  --  23 25 29   GLUCOSE 96  --  91 88 89  BUN 20  --  18 16 13   CREATININE 2.32* 2.22* 2.03* 1.78* 1.80*  CALCIUM 8.6  --  7.6* 8.0* 8.0*   Liver Function Tests:  Recent Labs Lab 09/29/14 0404 09/30/14 0514  AST 38* 32  ALT 12 12  ALKPHOS 29* 30*  BILITOT 0.7 0.5  PROT 5.7* 5.7*  ALBUMIN 3.1* 3.0*   No results for input(s): LIPASE, AMYLASE in the last 168 hours.  Recent Labs Lab 10/01/14 1126  AMMONIA 9*   CBC:  Recent Labs Lab 09/28/14 0309  09/29/14 2026 09/30/14 0826 09/30/14 1816 10/01/14 0607 10/01/14 1126  WBC 9.0  < > 5.6 4.8 4.7 5.1 5.0  NEUTROABS 6.7  --   --   --   --   --   --   HGB 11.3*  < > 9.3* 8.8* 8.5* 8.1* 8.1*  HCT 35.1*  < > 28.5* 27.1* 25.7* 24.4* 24.9*  MCV 95.9  < > 94.7 95.1 94.8 94.9 95.8  PLT 150  < > 109* 120* 116* 133* 132*  < > = values in this interval not displayed. Cardiac Enzymes:  Recent Labs Lab 09/28/14 0309  CKTOTAL 188*   BNP (last 3 results) No results for input(s): BNP in the last 8760 hours.  ProBNP (last 3 results)  Recent Labs  11/27/13 2255  PROBNP 4215.0*    CBG: No results for input(s): GLUCAP in the last 168 hours.  Recent Results (from the past 240 hour(s))  Blood culture (routine x 2)     Status: None (Preliminary result)   Collection Time: 09/28/14  3:09 AM  Result Value Ref Range Status   Specimen Description BLOOD LEFT ANTECUBITAL DRAWN BY RN  Final   Special Requests BOTTLES DRAWN AEROBIC AND ANAEROBIC 6CC  Final   Culture NO GROWTH 3 DAYS  Final   Report Status PENDING  Incomplete  Blood culture (routine x 2)     Status: None (Preliminary result)   Collection Time: 09/28/14  3:14 AM  Result Value Ref Range Status    Specimen Description BLOOD LEFT HAND  Final   Special Requests BOTTLES DRAWN AEROBIC ONLY 5CC  Final   Culture NO GROWTH 3 DAYS  Final   Report Status PENDING  Incomplete  Urine culture     Status: None   Collection Time: 09/28/14  3:33 AM  Result Value Ref Range Status   Specimen Description URINE, CATHETERIZED  Final   Special Requests NONE  Final   Colony Count NO GROWTH Performed at [facility]   Final   Culture NO GROWTH Performed at [facility]   Final   Report Status 09/30/2014 FINAL  Final  MRSA PCR Screening     Status: None   Collection Time: 09/28/14  6:14 AM  Result Value Ref Range Status   MRSA by PCR NEGATIVE NEGATIVE Final    Comment:        The GeneXpert MRSA Assay (FDA approved for NASAL specimens only), is one component of a comprehensive MRSA colonization surveillance program. It is not intended to diagnose MRSA infection nor to guide or monitor treatment for MRSA infections.      Studies: No results found.  Scheduled Meds: . diltiazem  30 mg Oral 3 times per day  . feeding supplement (ENSURE ENLIVE)  237 mL Oral BID BM  . furosemide  20 mg Oral Daily  . piperacillin-tazobactam (ZOSYN)  IV  3.375 g Intravenous 3 times per day  . sodium chloride  3 mL Intravenous Q12H   Continuous Infusions: . sodium chloride 60 mL/hr at 10/01/14 0516   Assessment and plan:  Principal Problem:   Sepsis associated hypotension Active Problems:   Volume depletion   Recurrent UTI   Lactic acidosis   Traumatic hematoma of buttock   CAD (coronary artery disease)   PAF (paroxysmal atrial fibrillation)   Acute kidney injury   CKD (chronic kidney disease) stage 3, GFR 30-59 ml/min   Thoracoabdominal aortic aneurysm   Acute blood loss anemia   History of CVA (cerebrovascular accident)   Cognitive decline   Dementia   Hypothyroidism   Urinary retention    1. Sepsis with lactic acidosis and hypotension, likely secondary to UTI. Blood  cultures and a urine culture ordered in the emergency department. She was started on IV Zosyn and vancomycin empirically. Gentle IV fluids were started for volume depletion and prerenal azotemia. Her lactic acid level which was elevated on admission has normalized. Her blood cultures and urine culture are negative date. Vancomycin was discontinued yesterday. We'll discontinue Zosyn today in light of the negative cultures so far. Will start oral Levaquin. Loose incontinent stools. The  patient has had 2 loose stools this morning; not watery. We'll send stool specimen for C. difficile PCR. We'll discontinue Zosyn. Acute on chronic delirium in the setting of baseline dementia. The patient is certainly more confused and agitated this morning. Ammonia level was ordered and was within normal limits. Her oxygen saturations were in the 90s on room air. We'll order a CT scan of her head. Check for urinary retention as this may be a cause of her worsening confusion. Recurrent urinary tract infection. Treatment as above in #1. Cultures are negative to date. Urinary retention. Following admission, the nursing noticed that the patient could not urinate spontaneously 4/2, so Foley catheter was placed. It drained out more than 700 cc of urine. Voiding trial was initiated this morning. So far, the patient has not been able to urinate. We'll ask the nursing staff to check a bladder scan and if she is retaining urine, wouldn't reinsert the Foley catheter and order urology consult. Recurrent urinary retention may be the source of her worsening confusion. Large right gluteal/buttock contusion and hematoma; status post fall at home. CT of the abdomen and pelvis was ordered for evaluation and  it confirmed a large right inferior gluteal hematoma/contusion, but no evidence of femur or pelvic fracture. Her hemoglobin had drifted down 3 g. She was transfused 1 unit of packed red blood cells on 4/3 with improvement.  Subcutaneous heparin and Plavix were discontinued. The extent of the hematoma has decreased significantly. Acute blood loss anemia; secondary to gluteal hematoma. The patient is treated chronically with Plavix for atrial fibrillation. It was discontinued on 4/3 /16.. Subcutaneous heparin was also discontinued and she was placed on SCDs for DVT prophylaxis. Her hemoglobin was 11.3 on admission. It had drifted down to  7.7. She was transfused 1 unit of packed rib blood cells with improvement in her hemoglobin to 9.3. It has drifted down to 8.1. We'll hold off on further transfusions for now.  Would transfusing another unit of packed red blood cells if her hemoglobin falls below 8.0. Would consider restarting Plavix in 1 week. Thrombocytopenia. The patient's platelet count was 150 on admission. It had drifted down to 97. It is now improved at 132. This  was likely consumption related. As stated above, subcutaneous heparin and Plavix are discontinued for now. We'll continue to monitor her CBC every 12 hours. Her vitamin B12 level  and TSH were within normal limits. Acute kidney injury, superimposed on stage III chronic kidney disease. The patient's baseline creatinine based on chart review is 1.6 to 2.0. It was 2.32 on admission. Etiology was likely prerenal azotemia in the setting of hematoma, infection, and poor oral intake and possibly urinary retention. She was started on gentle IV fluids with caution given her history of CHF. Her creatinine has improved towards baseline. Paroxysmal atrial fibrillation. The patient is on Plavix chronically for antiplatelet therapy. According to the medication review, the patient is no longer on metoprolol. She takes Norvasc chronically for treatment of hypertension. Her heart rate has been borderline elevated.  Norvasc was discontinued in favor of small doses of diltiazem. Hypertension. The patient had relative hypotension on admission from sepsis and  hypovolemia. Norvasc was withheld. Her blood pressure has improved. She was started on diltiazem. Known history of thoracoabdominal aortic aneurysm. CT of her abdomen and pelvis revealed a 6.4 x 6 cm the thoracoabdominal aortic aneurysm. Per the radiologist, it previously measured 4.2 cm. The patient was evaluated by cardiothoracic surgeon Dr. Caffie Pinto in 2012. [place] assessment,  the patient was not a candidate for operative repair given her comorbid conditions. We will continue to monitor her. Hypertension does not appear to be an issue right now. CAD and history of congestive heart failure. Currently stable and compensated. Her 2-D echocardiogram in May 2011 revealed an EF of 55-65% and grade 1 diastolic dysfunction. Chronic dementia with cognitive decline. We'll treat as needed confusion supportively; will try to hold off on giving her antipsychotic medications. ? History of hypothyroidism. Per pharmacy, the patient no longer takes Synthroid. Her TSH was within normal limits. Synthroid was discontinued.  Deconditioning: PT evaluated the patient and recommended short-term skilled nursing facility placement. This will need to be discussed with her family as her son is designated as her CAP aid.  Time spent:  30 minutes    [facility] Pager [phone]. If 7PM-7AM, please contact night-coverage at [email or web address], password [facility] 10/01/2014, 1:05 PM  LOS: 3 days

## 2014-10-01 NOTE — Progress Notes (Signed)
At approximately 0900, pt was assisted to chair with times two assist and gait belt. Tolerated well. Some unsteadiness noted.  At approximately 1000, pt was found to need assist to [facility].  Both RN and nurse tech assisted patient with gait belt use.  Tolerated fairly.  Weakness noted to be somewhat worsening at this time.  Upon assisting patient back to chair, RN and nurse tech, using gait belt, noted extreme weakness in bilateral legs.  Patient unable to tolerate transfer and was assisted to seated position on floor.  At this time, pt was alert and confused, which is her baseline.  Pt was assisted from seated position on floor back to bed with maximum assistance and gait belt.  Yellow fall risk socks in place during all of the above, call bell within reach, fall risk sign in place, chair alarm turned to on position.  VSS post unexpected decent to floor.  Surveyor, quantity and MD made aware of the above.  New orders to follow.

## 2014-10-02 ENCOUNTER — Inpatient Hospital Stay (HOSPITAL_COMMUNITY): Payer: Medicare Other

## 2014-10-02 DIAGNOSIS — I716 Thoracoabdominal aortic aneurysm, without rupture: Secondary | ICD-10-CM

## 2014-10-02 DIAGNOSIS — E872 Acidosis: Secondary | ICD-10-CM

## 2014-10-02 DIAGNOSIS — N183 Chronic kidney disease, stage 3 (moderate): Secondary | ICD-10-CM

## 2014-10-02 DIAGNOSIS — F0391 Unspecified dementia with behavioral disturbance: Secondary | ICD-10-CM

## 2014-10-02 LAB — BLOOD GAS, ARTERIAL
Acid-Base Excess: 4 mmol/L — ABNORMAL HIGH (ref 0.0–2.0)
Bicarbonate: 29.6 mEq/L — ABNORMAL HIGH (ref 20.0–24.0)
DRAWN BY: 234301
O2 Content: 3 L/min
O2 Saturation: 84.8 %
PATIENT TEMPERATURE: 37
PO2 ART: 52.4 mmHg — AB (ref 80.0–100.0)
TCO2: 28.6 mmol/L (ref 0–100)
pCO2 arterial: 58.5 mmHg (ref 35.0–45.0)
pH, Arterial: 7.324 — ABNORMAL LOW (ref 7.350–7.450)

## 2014-10-02 LAB — CBC
HCT: 23.9 % — ABNORMAL LOW (ref 36.0–46.0)
HCT: 24.2 % — ABNORMAL LOW (ref 36.0–46.0)
Hemoglobin: 7.7 g/dL — ABNORMAL LOW (ref 12.0–15.0)
Hemoglobin: 7.7 g/dL — ABNORMAL LOW (ref 12.0–15.0)
MCH: 31.2 pg (ref 26.0–34.0)
MCH: 30.9 pg (ref 26.0–34.0)
MCHC: 32.2 g/dL (ref 30.0–36.0)
MCHC: 31.8 g/dL (ref 30.0–36.0)
MCV: 96.8 fL (ref 78.0–100.0)
MCV: 97.2 fL (ref 78.0–100.0)
PLATELETS: 152 10*3/uL (ref 150–400)
Platelets: 141 10*3/uL — ABNORMAL LOW (ref 150–400)
RBC: 2.47 MIL/uL — ABNORMAL LOW (ref 3.87–5.11)
RBC: 2.49 MIL/uL — AB (ref 3.87–5.11)
RDW: 16.3 % — ABNORMAL HIGH (ref 11.5–15.5)
RDW: 16.5 % — ABNORMAL HIGH (ref 11.5–15.5)
WBC: 5.4 10*3/uL (ref 4.0–10.5)
WBC: 5.4 10*3/uL (ref 4.0–10.5)

## 2014-10-02 LAB — BASIC METABOLIC PANEL
ANION GAP: 8 (ref 5–15)
BUN: 13 mg/dL (ref 6–23)
CALCIUM: 7.9 mg/dL — AB (ref 8.4–10.5)
CO2: 28 mmol/L (ref 19–32)
Chloride: 104 mmol/L (ref 96–112)
Creatinine, Ser: 1.62 mg/dL — ABNORMAL HIGH (ref 0.50–1.10)
GFR calc Af Amer: 34 mL/min — ABNORMAL LOW (ref >90)
GFR calc non Af Amer: 29 mL/min — ABNORMAL LOW (ref >90)
GLUCOSE: 86 mg/dL (ref 70–99)
Potassium: 3.5 mmol/L (ref 3.5–5.1)
Sodium: 140 mmol/L (ref 135–145)

## 2014-10-02 MED ORDER — ALBUTEROL SULFATE (2.5 MG/3ML) 0.083% IN NEBU
2.5000 mg | INHALATION_SOLUTION | RESPIRATORY_TRACT | Status: DC | PRN
  Administered 2014-10-02: 2.5 mg via RESPIRATORY_TRACT
  Filled 2014-10-02: qty 3

## 2014-10-02 MED ORDER — FUROSEMIDE 10 MG/ML IJ SOLN
20.0000 mg | Freq: Once | INTRAMUSCULAR | Status: AC
  Administered 2014-10-02: 20 mg via INTRAVENOUS
  Filled 2014-10-02: qty 2

## 2014-10-02 MED ORDER — ALBUTEROL SULFATE (2.5 MG/3ML) 0.083% IN NEBU
2.5000 mg | INHALATION_SOLUTION | RESPIRATORY_TRACT | Status: DC
  Administered 2014-10-02 – 2014-10-03 (×4): 2.5 mg via RESPIRATORY_TRACT
  Filled 2014-10-02 (×4): qty 3

## 2014-10-02 NOTE — Progress Notes (Signed)
CRITICAL VALUE ALERT  Critical value received:  PH 7.324, pCO2 58.5, pO2 52.4, Bicarbonate 29.6, Acid Base Excess 4.0  Date of notification:  10/02/2014  Time of notification:  7588  Critical value read back: Yes  Nurse who received alert:  Vista Deck  MD notified (1st page):  Dr. Roderic Palau  Time of first page:  [phone]  Responding MD:  Dr. Mayer Camel  Time MD responded:  [phone]

## 2014-10-02 NOTE — Clinical Social Work Note (Signed)
CSW received a bed offer from [facility].  CSW attempted contact with patients relatives listed on chart and was unsuccessful (R. Toler's number was not a working number, B. Rakes a message was left, Sonia Side no answer was received and no option to leave a message, Arbie Cookey phone rang multiple times then it indicated that the voicemail was not set up).    Ambrose Pancoast, [place]

## 2014-10-02 NOTE — Progress Notes (Signed)
[facility] PROGRESS NOTE  Brandy Mcguire YSA:[id number] DOB: August 16, 1933 DOA: 09/28/2014 PCP: [name], MD    Code Status: Full code Family Communication: Discussed with daughter on 09/30/14 Disposition Plan: Discharge when clinically appropriate; PT is recommending SNF.   Consultants:    Procedures:   09/29/14-transfusion of 1 unit of packed red blood cells.  Antibiotics:  Levaquin 4/6>>  Zosyn 4/2>> 4/5  Vancomycin 4/2>>4/4  HPI/Subjective: Family reports that she was much more lethargic today as compared to yesterday.   Objective: Filed Vitals:   10/02/14 1624  BP: 120/83  Pulse: 97  Temp: 98.4 F (36.9 C)  Resp:     Intake/Output Summary (Last 24 hours) at 10/02/14 1922 Last data filed at 10/02/14 1815  Gross per 24 hour  Intake   1084 ml  Output   2150 ml  Net  -1066 ml   Filed Weights   09/28/14 0630 09/29/14 0500 09/30/14 0500  Weight: 80 kg (176 lb 5.9 oz) 83.4 kg (183 lb 13.8 oz) 83 kg (182 lb 15.7 oz)    Exam:   General:  No acute distress, somnolent  Cardiovascular: Irregular, irregular  Respiratory: Bilateral crackles with wheezing  Abdomen: Positive bowel sounds, soft, nontender, nondistended.  Musculoskeletal/extremities:  Decrease in size of previously massively large right buttock hematoma ; with significant  dependent surrounding ecchymosis. No pedal edema. No other acute hot red joints.  Neurologic/psychiatric: Patient is sleepy, wakes up to voice and answers questions. Speech is somewhat difficult to comprehend. She attempts to follow small commands. No obvious facial droop or slurred speech. She is oriented to herself but not the place. Strength appears to be equal bilaterally  Data Reviewed: Basic Metabolic Panel:  Recent Labs Lab 09/28/14 0309 09/28/14 0659 09/29/14 0404 09/30/14 0514 10/01/14 0604 10/02/14 0556  NA 134*  --  137 137 139 140  K 4.1  --  4.0 3.8 3.4* 3.5  CL 97  --  107 105 103 104   CO2 27  --  23 25 29 28   GLUCOSE 96  --  91 88 89 86  BUN 20  --  18 16 13 13   CREATININE 2.32* 2.22* 2.03* 1.78* 1.80* 1.62*  CALCIUM 8.6  --  7.6* 8.0* 8.0* 7.9*   Liver Function Tests:  Recent Labs Lab 09/29/14 0404 09/30/14 0514  AST 38* 32  ALT 12 12  ALKPHOS 29* 30*  BILITOT 0.7 0.5  PROT 5.7* 5.7*  ALBUMIN 3.1* 3.0*   No results for input(s): LIPASE, AMYLASE in the last 168 hours.  Recent Labs Lab 10/01/14 1126  AMMONIA 9*   CBC:  Recent Labs Lab 09/28/14 0309  10/01/14 0607 10/01/14 1126 10/01/14 1900 10/02/14 0558 10/02/14 1745  WBC 9.0  < > 5.1 5.0 5.5 5.4 5.4  NEUTROABS 6.7  --   --   --   --   --   --   HGB 11.3*  < > 8.1* 8.1* 8.0* 7.7* 7.7*  HCT 35.1*  < > 24.4* 24.9* 24.4* 23.9* 24.2*  MCV 95.9  < > 94.9 95.8 95.7 96.8 97.2  PLT 150  < > 133* 132* 127* 152 141*  < > = values in this interval not displayed. Cardiac Enzymes:  Recent Labs Lab 09/28/14 0309  CKTOTAL 188*   BNP (last 3 results) No results for input(s): BNP in the last 8760 hours.  ProBNP (last 3 results)  Recent Labs  11/27/13 2255  PROBNP 4215.0*    CBG: No results  for input(s): GLUCAP in the last 168 hours.  Recent Results (from the past 240 hour(s))  Blood culture (routine x 2)     Status: None (Preliminary result)   Collection Time: 09/28/14  3:09 AM  Result Value Ref Range Status   Specimen Description BLOOD LEFT ANTECUBITAL DRAWN BY RN  Final   Special Requests BOTTLES DRAWN AEROBIC AND ANAEROBIC 6CC  Final   Culture NO GROWTH 4 DAYS  Final   Report Status PENDING  Incomplete  Blood culture (routine x 2)     Status: None (Preliminary result)   Collection Time: 09/28/14  3:14 AM  Result Value Ref Range Status   Specimen Description BLOOD LEFT HAND  Final   Special Requests BOTTLES DRAWN AEROBIC ONLY 5CC  Final   Culture NO GROWTH 4 DAYS  Final   Report Status PENDING  Incomplete  Urine culture     Status: None   Collection Time: 09/28/14  3:33 AM   Result Value Ref Range Status   Specimen Description URINE, CATHETERIZED  Final   Special Requests NONE  Final   Colony Count NO GROWTH Performed at [facility]   Final   Culture NO GROWTH Performed at [facility]   Final   Report Status 09/30/2014 FINAL  Final  MRSA PCR Screening     Status: None   Collection Time: 09/28/14  6:14 AM  Result Value Ref Range Status   MRSA by PCR NEGATIVE NEGATIVE Final    Comment:        The GeneXpert MRSA Assay (FDA approved for NASAL specimens only), is one component of a comprehensive MRSA colonization surveillance program. It is not intended to diagnose MRSA infection nor to guide or monitor treatment for MRSA infections.   Clostridium Difficile by PCR     Status: None   Collection Time: 10/01/14 10:07 AM  Result Value Ref Range Status   C difficile by pcr NEGATIVE NEGATIVE Final     Studies: Dg Chest 1 View  10/01/2014   CLINICAL DATA:  Increased breath sounds  EXAM: CHEST  1 VIEW  COMPARISON:  09/28/2014  FINDINGS: Cardiac shadow is stable. Tortuosity of the thoracic aorta is stable. The lungs are clear bilaterally. No focal infiltrate or sizable effusion is seen. No acute bony abnormality is seen.  IMPRESSION: No acute abnormality seen.   Electronically Signed   By: Inez Catalina M.D.   On: 10/01/2014 16:18   Dg Abd 1 View  10/01/2014   CLINICAL DATA:  Urinary retention  EXAM: ABDOMEN - 1 VIEW  COMPARISON:  None.  FINDINGS: Scattered large and small bowel gas is noted. Tortuosity and aneurysmal dilatation of the abdominal aorta is again noted. Scattered diverticula are noted. No free air is noted. No significant soft tissue density in the pelvis is noted to suggest an over distended bladder. No bony abnormality is seen.  IMPRESSION: No acute abnormality noted.   Electronically Signed   By: Inez Catalina M.D.   On: 10/01/2014 16:06   Ct Head Wo Contrast  10/01/2014   CLINICAL DATA:  Subsequent evaluation of confusion  dementia after fall, unsteady gait since 09/28/2014  EXAM: CT HEAD WITHOUT CONTRAST  TECHNIQUE: Contiguous axial images were obtained from the base of the skull through the vertex without intravenous contrast.  COMPARISON:  09/28/2014  FINDINGS: No skull fracture.  Paranasal sinuses well aerated.  No intracranial hemorrhage or extra-axial fluid. Stable atrophy and white matter disease consistent with chronic small vessel ischemia.  Encephalomalacia left occipital lobe consistent with prior infarct, stable.  IMPRESSION: Chronic abnormalities with no change since prior study. No acute findings.   Electronically Signed   By: Skipper Cliche M.D.   On: 10/01/2014 14:54   Dg Chest Port 1 View  10/02/2014   CLINICAL DATA:  Weakness and shortness of Breath  EXAM: PORTABLE CHEST - 1 VIEW  COMPARISON:  10/01/2014  FINDINGS: There is rotational artifact. Aortic tortuosity is identified as well as calcified atherosclerotic change. Heart size is normal. There is no pleural effusion or edema. No airspace consolidation noted.  IMPRESSION: 1. No acute cardiopulmonary abnormalities. 2. Atherosclerotic disease and aortic tortuosity.   Electronically Signed   By: Kerby Moors M.D.   On: 10/02/2014 18:05    Scheduled Meds: . albuterol  2.5 mg Nebulization Q4H  . diltiazem  30 mg Oral 3 times per day  . feeding supplement (ENSURE ENLIVE)  237 mL Oral BID BM  . furosemide  20 mg Oral Daily  . levofloxacin  250 mg Oral Daily  . sodium chloride  3 mL Intravenous Q12H   Continuous Infusions:   Assessment and plan:  Principal Problem:   Sepsis associated hypotension Active Problems:   CAD (coronary artery disease)   History of CVA (cerebrovascular accident)   Cognitive decline   Volume depletion   Recurrent UTI   Lactic acidosis   Dementia with behavioral disturbance   PAF (paroxysmal atrial fibrillation)   Hypothyroidism   Acute kidney injury   CKD (chronic kidney disease) stage 3, GFR 30-59 ml/min    Traumatic hematoma of buttock   Thoracoabdominal aortic aneurysm   Acute blood loss anemia   Urinary retention    1. Sepsis with lactic acidosis and hypotension, likely secondary to UTI. Blood cultures and a urine culture ordered in the emergency department. She was started on IV Zosyn and vancomycin empirically. Gentle IV fluids were started for volume depletion and prerenal azotemia. Her lactic acid level which was elevated on admission has normalized. Her blood cultures and urine culture are negative date. IV antibiotics have been discontinued and she's been transitioned over to oral Levaquin Loose incontinent stools. Appears to have resolved. C. difficile negative. Possibly related to antibiotics. Acute on chronic delirium in the setting of baseline dementia. Patient has been more lethargic today. CT scan of the head was unremarkable. Ammonia level was also normal range. She has been becoming increasingly hypoxic and having wheezing. I suspect that she may be developing some element of volume overload. Blood gases show an elevated PCO2 with a relatively stable pH. Will increase her oxygen supplementations. Discontinue IV fluids and give a dose of Lasix. She is able to answer questions and interact, but falls back asleep. If her mental status worsens, she may need to be started on BiPAP. Recurrent urinary tract infection. Treatment as above in #1. Cultures are negative to date. Urinary retention. Following admission, the nursing noticed that the patient could not urinate spontaneously 4/2, so Foley catheter was placed. It drained out more than 700 cc of urine. Voiding trial was attempted but patient failed and required Foley catheter to be replaced. She will need outpatient follow-up with urology. Recurrent urinary retention may be the source of her worsening confusion. Large right gluteal/buttock contusion and hematoma; status post fall at home. CT of the abdomen and pelvis was ordered for  evaluation and  it confirmed a large right inferior gluteal hematoma/contusion, but no evidence of femur or pelvic fracture. Her hemoglobin had drifted  down 3 g. She was transfused 1 unit of packed red blood cells on 4/3 with improvement. Subcutaneous heparin and Plavix were discontinued. The extent of the hematoma has decreased significantly. Acute blood loss anemia; secondary to gluteal hematoma. The patient is treated chronically with Plavix for atrial fibrillation. It was discontinued on 4/3 /16. Subcutaneous heparin was also discontinued and she was placed on SCDs for DVT prophylaxis. Her hemoglobin was 11.3 on admission. It had drifted down to  7.7. She was transfused 1 unit of packed rib blood cells with improvement in her hemoglobin to 9.3. It has drifted back down to 7.7. We'll hold off on further transfusions for now. Will consider transfusion if her hemoglobin drops below 7.. Would consider restarting Plavix in 1 week. Thrombocytopenia. The patient's platelet count was 150 on admission. It had drifted down to 97. It has since returned to normal range. This  was likely consumption related. As stated above, subcutaneous heparin and Plavix are discontinued for now. We'll continue to monitor her CBC every 12 hours. Her vitamin B12 level  and TSH were within normal limits. Acute kidney injury, superimposed on stage III chronic kidney disease. The patient's baseline creatinine based on chart review is 1.6 to 2.0. It was 2.32 on admission. Etiology was likely prerenal azotemia in the setting of hematoma, infection, and poor oral intake and possibly urinary retention. She was started on gentle IV fluids. Her creatinine has improved towards baseline. Paroxysmal atrial fibrillation. The patient is on Plavix chronically for antiplatelet therapy. According to the medication review, the patient is no longer on metoprolol. She takes Norvasc chronically for treatment of hypertension. Her heart rate has  been borderline elevated.  Norvasc was discontinued in favor of small doses of diltiazem. Hypertension. The patient had relative hypotension on admission from sepsis and hypovolemia. Norvasc was withheld. Her blood pressure has improved. She was started on diltiazem. Known history of thoracoabdominal aortic aneurysm. CT of her abdomen and pelvis revealed a 6.4 x 6 cm the thoracoabdominal aortic aneurysm. Per the radiologist, it previously measured 4.2 cm. The patient was evaluated by cardiothoracic surgeon Dr. Cyndia Bent in 2012. Per his assessment, the patient was not a candidate for operative repair given her comorbid conditions. We will continue to monitor her. Hypertension does not appear to be an issue right now. CAD and history of congestive heart failure. Currently stable and compensated. Her 2-D echocardiogram in May 2011 revealed an EF of 55-65% and grade 1 diastolic dysfunction. Chronic dementia with cognitive decline. We'll treat as needed confusion supportively; will try to hold off on giving her antipsychotic medications. ? History of hypothyroidism. Per pharmacy, the patient no longer takes Synthroid. Her TSH was within normal limits. Synthroid was discontinued.  Deconditioning: PT evaluated the patient and recommended short-term skilled nursing facility placement. Plans are to discharge to SNF when she is improved  Time spent:  30 minutes    [facility] Pager [phone]. If 7PM-7AM, please contact night-coverage at [email or web address], password [facility] 10/02/2014, 7:22 PM  LOS: 4 days

## 2014-10-02 NOTE — Progress Notes (Signed)
Per patient's son patient appeared somnolent. Assessed patient and her oxygen level was 88 on room air. Patient placed on 2L O2 and her O2 sat is now 99%. Pt also is experiencing expiratory wheezing. Head of bed elevated to 30 degrees. Will continue to monitor patient.

## 2014-10-02 NOTE — Progress Notes (Signed)
MD notified about critical lab values. Per MD continue nebulizer treats and continue to monitor patient throughout the shift. Per respiratory therapist request pt to be increased from 3L to 5L O2.

## 2014-10-02 NOTE — Progress Notes (Signed)
PT Cancellation Note  Patient Details Name: Brandy Mcguire MRN: [id number] DOB: 11-Sep-1933   Cancelled Treatment:    Reason Eval/Treat Not Completed: Medical issues which prohibited therapy  Pt with increased difficulty breathing and very somnolent. RN present and evaluating.   Sable Feil 10/02/2014, 12:35 PM

## 2014-10-03 ENCOUNTER — Inpatient Hospital Stay
Admission: RE | Admit: 2014-10-03 | Discharge: 2014-11-01 | Disposition: A | Discharge status: Home / Self Care | Payer: Medicaid Other | Source: Ambulatory Visit | Attending: Internal Medicine | Admitting: Internal Medicine

## 2014-10-03 LAB — CBC
HCT: 24.4 % — ABNORMAL LOW (ref 36.0–46.0)
HEMOGLOBIN: 7.6 g/dL — AB (ref 12.0–15.0)
MCH: 31.1 pg (ref 26.0–34.0)
MCHC: 31.1 g/dL (ref 30.0–36.0)
MCV: 100 fL (ref 78.0–100.0)
Platelets: 148 10*3/uL — ABNORMAL LOW (ref 150–400)
RBC: 2.44 MIL/uL — ABNORMAL LOW (ref 3.87–5.11)
RDW: 16.8 % — ABNORMAL HIGH (ref 11.5–15.5)
WBC: 4.4 10*3/uL (ref 4.0–10.5)

## 2014-10-03 LAB — BASIC METABOLIC PANEL
ANION GAP: 6 (ref 5–15)
BUN: 11 mg/dL (ref 6–23)
CHLORIDE: 101 mmol/L (ref 96–112)
CO2: 33 mmol/L — ABNORMAL HIGH (ref 19–32)
CREATININE: 1.55 mg/dL — AB (ref 0.50–1.10)
Calcium: 8 mg/dL — ABNORMAL LOW (ref 8.4–10.5)
GFR calc non Af Amer: 31 mL/min — ABNORMAL LOW (ref >90)
GFR, EST AFRICAN AMERICAN: 36 mL/min — AB (ref >90)
Glucose, Bld: 92 mg/dL (ref 70–99)
Potassium: 3.2 mmol/L — ABNORMAL LOW (ref 3.5–5.1)
Sodium: 140 mmol/L (ref 135–145)

## 2014-10-03 LAB — CULTURE, BLOOD (ROUTINE X 2)
CULTURE: NO GROWTH
Culture: NO GROWTH

## 2014-10-03 MED ORDER — CLOPIDOGREL BISULFATE 75 MG PO TABS: 75.0000 mg | ORAL_TABLET | Freq: Every day | ORAL | Status: AC

## 2014-10-03 MED ORDER — GUAIFENESIN ER 600 MG PO TB12: 600.0000 mg | ORAL_TABLET | Freq: Two times a day (BID) | ORAL | Status: DC

## 2014-10-03 MED ORDER — IPRATROPIUM-ALBUTEROL 0.5-2.5 (3) MG/3ML IN SOLN: 3.0000 mL | Freq: Four times a day (QID) | RESPIRATORY_TRACT | Status: AC

## 2014-10-03 MED ORDER — DILTIAZEM HCL 30 MG PO TABS
30.0000 mg | ORAL_TABLET | Freq: Three times a day (TID) | ORAL | Status: DC
Start: 2014-10-03 — End: 2015-03-08

## 2014-10-03 MED ORDER — ALBUTEROL SULFATE (2.5 MG/3ML) 0.083% IN NEBU
2.5000 mg | INHALATION_SOLUTION | Freq: Four times a day (QID) | RESPIRATORY_TRACT | Status: DC
  Administered 2014-10-03: 2.5 mg via RESPIRATORY_TRACT
  Filled 2014-10-03: qty 3

## 2014-10-03 MED ORDER — FUROSEMIDE 20 MG PO TABS
20.0000 mg | ORAL_TABLET | Freq: Every day | ORAL | Status: DC
Start: 2014-10-03 — End: 2014-12-10

## 2014-10-03 NOTE — Progress Notes (Signed)
Patient with orders to be discharged to [facility]. Report called to [facility], nurse. Discharge packet sent with patient. Patient stable. Patient transported via staff.

## 2014-10-03 NOTE — Progress Notes (Signed)
Physical Therapy Treatment Patient Details Name: Brandy Mcguire MRN: [id number] DOB: 1934/04/03 Today's Date: 10/03/2014    History of Present Illness Brandy Mcguire is an 81 y.o. female with hx of dementia, hx of CHF, HTN, CAD s/p prior MI, hypothyroidism, chronic afib managed by cardiology, not on anticoagulation due to her thoracic aneurysm (per cardiology's office note), lives at home with her son, brought to the ER after a fall. She did not lose consciousness. She was noted to be more confused tonight than her baseline dementia. In the ER, she was found to have hypotension, with SBP 80's, tachycardic, and found to have elevated lactic acid level of 4.0. Sepsis work up was done, but no definite source was found, to include a negative UA, negative CXR for infiltrate, though some vascular congestion was found. Her WBC was not elevated, and she had no fever. She was given 2.5 L of IVF bolus, and her SBP was about 100. She also had a negative head and cervical CT. When I saw her, she was alert and knows he is in the hospital. She converse meaningfully. Her Cr was found to be elevated to 2.32, with bicarb of 27. Hospitalist was asked to admit her for possible sepsis of unclear source, with volume depletion and AKI, along with transient altered mental status.     PT Comments    Pt was much more alert today and is currently on 2 L O2.  She reports feeling better overall however she demonstrates significantly more weakness today than she had on 10-02-14.  Most therapeutic exercise was active assisted and she needed total assist to transfer to sitting.  She was very fearful of attempting to stand and was unable to do so to a walker.  She could stand with max assist of therapist but pt became very frightened and requested to return to bed (which was done).  Follow Up Recommendations  SNF     Equipment Recommendations  None recommended by PT    Recommendations for Other Services   none     Precautions / Restrictions Precautions Precautions: Fall Restrictions Weight Bearing Restrictions: No    Mobility  Bed Mobility Overal bed mobility: Needs Assistance Bed Mobility: Supine to Sit     Supine to sit: Total assist     General bed mobility comments: too weak to move independently  Transfers Overall transfer level: Needs assistance Equipment used: Rolling walker (2 wheeled) Transfers: Sit to/from Stand Sit to Stand: Max assist         General transfer comment: pt becomes very fearful with attempt at standing and is unable to stand with a walker...she was able to attain stance with therapist as support but could not weight shift enough in order to transfer to a chair  Ambulation/Gait                                 Balance Overall balance assessment: Needs assistance Sitting-balance support: No upper extremity supported;Feet supported Sitting balance-Leahy Scale: Fair       Standing balance-Leahy Scale: Poor                      Cognition Arousal/Alertness: Awake/alert Behavior During Therapy: WFL for tasks assessed/performed Overall Cognitive Status: History of cognitive impairments - at baseline                      Exercises General  Exercises - Lower Extremity Ankle Circles/Pumps: AROM;Both;10 reps;Supine Short Arc Quad: AAROM;Both;10 reps;Supine Heel Slides: AAROM;Both;10 reps;Supine Hip ABduction/ADduction: AAROM;Both;10 reps;Supine            Pertinent Vitals/Pain Pain Assessment: No/denies pain                                      PT Goals (current goals can now be found in the care plan section) Progress towards PT goals: Not progressing toward goals - comment (pt has had several medical issues which have prevented any mobility since last PT visit...she is now much weakner)    Frequency  Min 3X/week    PT Plan Current plan remains appropriate                 End of  Session Equipment Utilized During Treatment: Gait belt;Oxygen Activity Tolerance: Patient limited by fatigue (limited by fear of falling) Patient left: in bed;with call bell/phone within reach;with bed alarm set     Time: 1127-1210 PT Time Calculation (min) (ACUTE ONLY): 43 min  Charges:  $Therapeutic Exercise: 8-22 mins $Therapeutic Activity: 8-22 mins                    G Codes:      Sable Feil 10-16-14, 12:26 PM

## 2014-10-03 NOTE — Progress Notes (Signed)
UR chart review completed.  

## 2014-10-03 NOTE — Clinical Social Work Note (Signed)
CSW left a message for patient's son, Sonia Side, requesting return contact.   CSW spoke with patient's daughter, Arbie Cookey, and made bed offer from [facility]. Arbie Cookey accepted bed offer.  CSW advised that patient may be discharged today.   Ambrose Pancoast, Uncertain

## 2014-10-03 NOTE — Discharge Summary (Signed)
Physician Discharge Summary  Brandy Mcguire IHW:[id number] DOB: Mar 30, 1934 DOA: 09/28/2014  PCP: [name], MD  Admit date: 09/28/2014 Discharge date: 10/03/2014  Time spent: 40 minutes  Recommendations for Outpatient Follow-up:  1. Patient will be discharged with foley catheter. Follow up with urology in 1 week for voiding trial 2. Recheck cbc in 1 week. If hemoglobin is stable (currently 7.6) consider restarting plavix 3. Wean off oxygen as tolerated.  Discharge Diagnoses:  Principal Problem:   Sepsis associated hypotension Active Problems:   CAD (coronary artery disease)   History of CVA (cerebrovascular accident)   Cognitive decline   Volume depletion   Recurrent UTI   Lactic acidosis   Dementia with behavioral disturbance   PAF (paroxysmal atrial fibrillation)   Hypothyroidism   Acute kidney injury   CKD (chronic kidney disease) stage 3, GFR 30-59 ml/min   Traumatic hematoma of buttock   Thoracoabdominal aortic aneurysm   Acute blood loss anemia   Urinary retention   Discharge Condition: improved  Diet recommendation: low salt  Filed Weights   09/28/14 0630 09/29/14 0500 09/30/14 0500  Weight: 80 kg (176 lb 5.9 oz) 83.4 kg (183 lb 13.8 oz) 83 kg (182 lb 15.7 oz)    History of present illness:  This patient was admitted to the hospital after suffering a fall. She was noted to be more confused than her baseline. She was noted to be hypotensive in the emergency room, tachycardic and had an elevated lactic acid. It was felt that she had sepsis. She was given IV fluids and was admitted to the hospital for further treatments.  Hospital Course:  Sepsis with lactic acidosis and hypotension, likely secondary to UTI. Blood cultures and a urine culture ordered in the emergency department did not show any growth. She was initially started on IV Zosyn and vancomycin empirically. Gentle IV fluids were started for volume depletion and prerenal azotemia. Her lactic acid  level which was elevated on admission has normalized. As her clinical condition improved, IV antibiotics were discontinued she was then transitioned over to Levaquin and has completed her antibiotic course for 7 days.  Loose incontinent stools. Appears to have resolved. C. difficile negative. Possibly related to antibiotics.  Acute on chronic delirium in the setting of baseline dementia. Mental status has improved to baseline. She may have had worsening mentation related to underlying sepsis and infection. She also was noted to have hypoxia which made her more lethargic. Her mental status now approaching baseline with improvement of her sepsis and hypoxia.  Recurrent urinary tract infection. Treatment as above in #1. Cultures are negative to date.  Urinary retention. Following admission, the nursing noticed that the patient could not urinate spontaneously 4/2, so Foley catheter was placed. It drained out more than 700 cc of urine. Voiding trial was attempted but patient failed and required Foley catheter to be replaced. She will need outpatient follow-up with urology.  Large right gluteal/buttock contusion and hematoma; status post fall at home. CT of the abdomen and pelvis was ordered for evaluation and it confirmed a large right inferior gluteal hematoma/contusion, but no evidence of femur or pelvic fracture. Her hemoglobin had drifted down 3 g. She was transfused 1 unit of packed red blood cells on 4/3 with improvement. Subcutaneous heparin and Plavix were discontinued. The extent of the hematoma has decreased significantly. We'll need to continue to follow hemoglobin in one week. If this is stable can consider restarting Plavix.  Acute blood loss anemia; secondary to gluteal  hematoma. The patient is treated chronically with Plavix for atrial fibrillation. It was discontinued on 4/3 /16. Subcutaneous heparin was also discontinued and she was placed on SCDs for DVT prophylaxis. Her  hemoglobin was 11.3 on admission. It had drifted down to 7.7. She was transfused 1 unit of packed rib blood cells with improvement in her hemoglobin to 9.3. It has drifted back down to 7.6, but has been stable for several days.Burnis Medin hold off on further transfusions for now. She will need her hemoglobin rechecked in one week. At this stable then we can consider restarting Plavix in 1 week.  Thrombocytopenia. The patient's platelet count was 150 on admission. It had drifted down to 97. It has since returned to normal range. This was likely consumption related. Her vitamin B12 level and TSH were within normal limits.  Acute kidney injury, superimposed on stage III chronic kidney disease. The patient's baseline creatinine based on chart review is 1.6 to 2.0. It was 2.32 on admission. Etiology was likely prerenal azotemia in the setting of hematoma, infection, and poor oral intake and possibly urinary retention. She was started on gentle IV fluids. Her creatinine has improved towards baseline.  Paroxysmal atrial fibrillation. The patient is on Plavix chronically for antiplatelet therapy. According to the medication review, the patient is no longer on metoprolol. She takes Norvasc chronically for treatment of hypertension. Her heart rate has been borderline elevated. Norvasc was discontinued in favor of small doses of diltiazem with improvement of her heart rate and blood pressure.  Hypertension. The patient had relative hypotension on admission from sepsis and hypovolemia. Norvasc was withheld. Her blood pressure has improved. She was started on diltiazem.  Known history of thoracoabdominal aortic aneurysm. CT of her abdomen and pelvis revealed a 6.4 x 6 cm the thoracoabdominal aortic aneurysm. Per the radiologist, it previously measured 4.2 cm. The patient was evaluated by cardiothoracic surgeon Dr. Cyndia Bent in 2012. Per his assessment, the patient was not a candidate for operative repair  given her comorbid conditions. We will continue to monitor her. Hypertension does not appear to be an issue right now.  CAD and history of congestive heart failure. Currently stable and compensated. Her 2-D echocardiogram in May 2011 revealed an EF of 55-65% and grade 1 diastolic dysfunction.  Chronic dementia with cognitive decline. We'll treat as needed confusion supportively; will try to hold off on giving her antipsychotic medications.  ? History of hypothyroidism. Per pharmacy, the patient no longer takes Synthroid. Her TSH was within normal limits. Synthroid was discontinued.  Deconditioning: PT evaluated the patient and recommended short-term skilled nursing facility placement. Plans are to discharge to SNF   Procedures:  09/29/14-transfusion of 1 unit of packed red blood cells.  Consultations:    Discharge Exam: Filed Vitals:   10/03/14 0509  BP: 120/81  Pulse: 85  Temp: 97.7 F (36.5 C)  Resp: 14    General: NAD Cardiovascular: S1, S2 RRR Respiratory: bilateral rhonchi  Discharge Instructions    Current Discharge Medication List    START taking these medications   Details  diltiazem (CARDIZEM) 30 MG tablet Take 1 tablet (30 mg total) by mouth every 8 (eight) hours.    furosemide (LASIX) 20 MG tablet Take 1 tablet (20 mg total) by mouth daily. Qty: 30 tablet    guaiFENesin (MUCINEX) 600 MG 12 hr tablet Take 1 tablet (600 mg total) by mouth 2 (two) times daily.    ipratropium-albuterol (DUONEB) 0.5-2.5 (3) MG/3ML SOLN Take 3 mLs by nebulization  every 6 (six) hours. Qty: 360 mL      CONTINUE these medications which have CHANGED   Details  clopidogrel (PLAVIX) 75 MG tablet Take 1 tablet (75 mg total) by mouth at bedtime. Resume in 1 week if hemoglobin stable      CONTINUE these medications which have NOT CHANGED   Details  atorvastatin (LIPITOR) 40 MG tablet Take 40 mg by mouth at bedtime.    senna-docusate (SENOKOT-S) 8.6-50 MG per tablet Take 1  tablet by mouth daily.    traZODone (DESYREL) 50 MG tablet Take 50 mg by mouth at bedtime.      bisacodyl (DULCOLAX) 5 MG EC tablet Take 10 mg by mouth daily as needed for mild constipation or moderate constipation.      STOP taking these medications     amLODipine (NORVASC) 5 MG tablet        No Known Allergies    The results of significant diagnostics from this hospitalization (including imaging, microbiology, ancillary and laboratory) are listed below for reference.    Significant Diagnostic Studies: Ct Abdomen Pelvis Wo Contrast  09/28/2014   CLINICAL DATA:  Large bruising bilaterally to back of thighs and buttocks that appears to be old per ED notes. Bruising has dark appearance. Pt has had multiple falls recently but is unable to give clear details due to dementia. Pt has known thoracic and aortic aneurysm, hx of hysterectomy,chf,CKD  EXAM: CT ABDOMEN AND PELVIS WITHOUT CONTRAST  TECHNIQUE: Multidetector CT imaging of the abdomen and pelvis was performed following the standard protocol without IV contrast.  COMPARISON:  11/26/2009  FINDINGS: There is infiltration throughout the soft tissues of the left inferior gluteal region with a smaller more focal areas central opacity. Findings are consistent with a soft tissue hematoma and associated edema. There is no evidence of a proximal femur or pelvic fracture. There is no evidence of osteomyelitis.  No lung base consolidation or edema. Stable 8 mm nodule at the base of the right lower lobe.  There is diffuse dilation of the thoracoabdominal aorta. Just above the aortic hiatus, the inferior descending thoracic aorta measures 6.4 cm x 6 cm in greatest transverse dimensions, previously 4.9 cm in greatest diameter. At the level of the midpole the left kidney, aorta measures 6.5 cm x 5.8 cm transversely. It measures 4.2 cm in greatest anterior-posterior dimension previously, 5.8 cm on the current exam. No evidence of aortic rupture. There is diffuse  atherosclerotic plaque and evidence of mural thrombus.  Liver, spleen, gallbladder and pancreas are unremarkable. No adrenal masses.  Superiorly atrophic right kidney, stable. Nonobstructing stone in the lower pole of the left kidney. No left renal mass. No hydronephrosis. Ureters normal course and in caliber.  Bladder is mildly distended. There are calcifications that project along the posterior bladder. This area was not imaged on prior CTs. No bladder wall thickening. No definite mass.  Uterus surgically absent.  No pelvic masses.  No pathologically enlarged lymph nodes. No ascites or hemo peritoneum.  There are scattered colonic diverticula. No diverticulitis. No bowel wall thickening or inflammatory changes. No dilation to suggest obstruction or adynamic ileus.  Musculoskeletal:  No fractures.  IMPRESSION: 1. Large right inferior gluteal region soft tissue hematoma/contusion. There is no proximal femur or pelvic fracture, over. 2. No other acute finding. 3. Multiple chronic findings as detailed. 4. Diffuse dilation throughout the abdominal aorta, increased in caliber when compared to the prior CT. No evidence of abdominal aortic rupture.   Electronically Signed  By: Lajean Manes M.D.   On: 09/28/2014 10:32   Dg Chest 1 View  10/01/2014   CLINICAL DATA:  Increased breath sounds  EXAM: CHEST  1 VIEW  COMPARISON:  09/28/2014  FINDINGS: Cardiac shadow is stable. Tortuosity of the thoracic aorta is stable. The lungs are clear bilaterally. No focal infiltrate or sizable effusion is seen. No acute bony abnormality is seen.  IMPRESSION: No acute abnormality seen.   Electronically Signed   By: Inez Catalina M.D.   On: 10/01/2014 16:18   Dg Abd 1 View  10/01/2014   CLINICAL DATA:  Urinary retention  EXAM: ABDOMEN - 1 VIEW  COMPARISON:  None.  FINDINGS: Scattered large and small bowel gas is noted. Tortuosity and aneurysmal dilatation of the abdominal aorta is again noted. Scattered diverticula are noted. No free air  is noted. No significant soft tissue density in the pelvis is noted to suggest an over distended bladder. No bony abnormality is seen.  IMPRESSION: No acute abnormality noted.   Electronically Signed   By: Inez Catalina M.D.   On: 10/01/2014 16:06   Ct Head Wo Contrast  10/01/2014   CLINICAL DATA:  Subsequent evaluation of confusion dementia after fall, unsteady gait since 09/28/2014  EXAM: CT HEAD WITHOUT CONTRAST  TECHNIQUE: Contiguous axial images were obtained from the base of the skull through the vertex without intravenous contrast.  COMPARISON:  09/28/2014  FINDINGS: No skull fracture.  Paranasal sinuses well aerated.  No intracranial hemorrhage or extra-axial fluid. Stable atrophy and white matter disease consistent with chronic small vessel ischemia. Encephalomalacia left occipital lobe consistent with prior infarct, stable.  IMPRESSION: Chronic abnormalities with no change since prior study. No acute findings.   Electronically Signed   By: Skipper Cliche M.D.   On: 10/01/2014 14:54   Ct Head Wo Contrast  09/28/2014   CLINICAL DATA:  Lives at home. Urinary tract infection symptoms. Multiple falls, sharp neck pain and stiffness, headache and weakness.  EXAM: CT HEAD WITHOUT CONTRAST  CT CERVICAL SPINE WITHOUT CONTRAST  TECHNIQUE: Multidetector CT imaging of the head and cervical spine was performed following the standard protocol without intravenous contrast. Multiplanar CT image reconstructions of the cervical spine were also generated.  COMPARISON:  MRI of the brain report August 14, 2000 though images are not available for direct comparison.  FINDINGS: CT HEAD FINDINGS  No intraparenchymal hemorrhage, mass effect nor midline shift. Patchy supratentorial white matter hypodensities are within normal range for patient's age and though non-specific suggest sequelae of chronic small vessel ischemic disease. No acute large vascular territory infarcts. LEFT occipital lobe encephalomalacia with ex vacuo  dilatation of LEFT occipital horn. Ventricles and sulci are otherwise normal for patient's age.  No abnormal extra-axial fluid collections. Basal cisterns are patent. Moderate calcific atherosclerosis of the carotid siphons.  No skull fracture. The included ocular globes and orbital contents are non-suspicious. The mastoid aircells and included paranasal sinuses are well-aerated. Patient is edentulous.  CT CERVICAL SPINE FINDINGS  Cervical vertebral bodies and posterior elements intact. Straightened cervical lordosis. Large C7 superior endplate Schmorl's node with additional smaller Schmorl's nodes evident. Moderate C3-4 disc height loss, uncovertebral hypertrophy and at ventral endplate spurring consistent with degenerative disc, moderate C4-5, C5-6 and C6-7. C1-2 articulation maintained with mild arthropathy. Moderate calcific atherosclerosis of the carotid bulbs, without acute findings.  Mild osseous canal stenosis C4-5, C5-6.  IMPRESSION: CT HEAD: No acute intracranial process.  Remote LEFT posterior cerebral artery territory infarct. Mild to moderate white  matter changes likely represent chronic small vessel ischemic disease.  CT CERVICAL SPINE: Straightened cervical lordosis without acute fracture nor malalignment.   Electronically Signed   By: Elon Alas   On: 09/28/2014 04:28   Ct Cervical Spine Wo Contrast  09/28/2014   CLINICAL DATA:  Lives at home. Urinary tract infection symptoms. Multiple falls, sharp neck pain and stiffness, headache and weakness.  EXAM: CT HEAD WITHOUT CONTRAST  CT CERVICAL SPINE WITHOUT CONTRAST  TECHNIQUE: Multidetector CT imaging of the head and cervical spine was performed following the standard protocol without intravenous contrast. Multiplanar CT image reconstructions of the cervical spine were also generated.  COMPARISON:  MRI of the brain report August 14, 2000 though images are not available for direct comparison.  FINDINGS: CT HEAD FINDINGS  No intraparenchymal  hemorrhage, mass effect nor midline shift. Patchy supratentorial white matter hypodensities are within normal range for patient's age and though non-specific suggest sequelae of chronic small vessel ischemic disease. No acute large vascular territory infarcts. LEFT occipital lobe encephalomalacia with ex vacuo dilatation of LEFT occipital horn. Ventricles and sulci are otherwise normal for patient's age.  No abnormal extra-axial fluid collections. Basal cisterns are patent. Moderate calcific atherosclerosis of the carotid siphons.  No skull fracture. The included ocular globes and orbital contents are non-suspicious. The mastoid aircells and included paranasal sinuses are well-aerated. Patient is edentulous.  CT CERVICAL SPINE FINDINGS  Cervical vertebral bodies and posterior elements intact. Straightened cervical lordosis. Large C7 superior endplate Schmorl's node with additional smaller Schmorl's nodes evident. Moderate C3-4 disc height loss, uncovertebral hypertrophy and at ventral endplate spurring consistent with degenerative disc, moderate C4-5, C5-6 and C6-7. C1-2 articulation maintained with mild arthropathy. Moderate calcific atherosclerosis of the carotid bulbs, without acute findings.  Mild osseous canal stenosis C4-5, C5-6.  IMPRESSION: CT HEAD: No acute intracranial process.  Remote LEFT posterior cerebral artery territory infarct. Mild to moderate white matter changes likely represent chronic small vessel ischemic disease.  CT CERVICAL SPINE: Straightened cervical lordosis without acute fracture nor malalignment.   Electronically Signed   By: Elon Alas   On: 09/28/2014 04:28   Dg Chest Port 1 View  10/02/2014   CLINICAL DATA:  Weakness and shortness of Breath  EXAM: PORTABLE CHEST - 1 VIEW  COMPARISON:  10/01/2014  FINDINGS: There is rotational artifact. Aortic tortuosity is identified as well as calcified atherosclerotic change. Heart size is normal. There is no pleural effusion or edema. No  airspace consolidation noted.  IMPRESSION: 1. No acute cardiopulmonary abnormalities. 2. Atherosclerotic disease and aortic tortuosity.   Electronically Signed   By: Kerby Moors M.D.   On: 10/02/2014 18:05   Dg Chest Portable 1 View  09/28/2014   CLINICAL DATA:  Acute onset of bilateral sharp neck pain and stiffness. Multiple falls. Increased urinary frequency and hypotension. Initial encounter.  EXAM: PORTABLE CHEST - 1 VIEW  COMPARISON:  Chest radiograph performed 11/27/2013  FINDINGS: The lungs are well-aerated. Mild vascular congestion is noted. There is no evidence of focal opacification, pleural effusion or pneumothorax.  The cardiomediastinal silhouette is borderline normal in size. No acute osseous abnormalities are seen.  IMPRESSION: Mild vascular congestion noted; lungs remain grossly clear. No displaced rib fracture seen.   Electronically Signed   By: Garald Balding M.D.   On: 09/28/2014 03:55    Microbiology: Recent Results (from the past 240 hour(s))  Blood culture (routine x 2)     Status: None   Collection Time: 09/28/14  3:09 AM  Result Value Ref Range Status   Specimen Description BLOOD LEFT ANTECUBITAL DRAWN BY RN  Final   Special Requests BOTTLES DRAWN AEROBIC AND ANAEROBIC 6CC  Final   Culture NO GROWTH 5 DAYS  Final   Report Status 10/03/2014 FINAL  Final  Blood culture (routine x 2)     Status: None   Collection Time: 09/28/14  3:14 AM  Result Value Ref Range Status   Specimen Description BLOOD LEFT HAND  Final   Special Requests BOTTLES DRAWN AEROBIC ONLY 5CC  Final   Culture NO GROWTH 5 DAYS  Final   Report Status 10/03/2014 FINAL  Final  Urine culture     Status: None   Collection Time: 09/28/14  3:33 AM  Result Value Ref Range Status   Specimen Description URINE, CATHETERIZED  Final   Special Requests NONE  Final   Colony Count NO GROWTH Performed at [facility]   Final   Culture NO GROWTH Performed at [facility]   Final   Report Status  09/30/2014 FINAL  Final  MRSA PCR Screening     Status: None   Collection Time: 09/28/14  6:14 AM  Result Value Ref Range Status   MRSA by PCR NEGATIVE NEGATIVE Final    Comment:        The GeneXpert MRSA Assay (FDA approved for NASAL specimens only), is one component of a comprehensive MRSA colonization surveillance program. It is not intended to diagnose MRSA infection nor to guide or monitor treatment for MRSA infections.   Clostridium Difficile by PCR     Status: None   Collection Time: 10/01/14 10:07 AM  Result Value Ref Range Status   C difficile by pcr NEGATIVE NEGATIVE Final     Labs: Basic Metabolic Panel:  Recent Labs Lab 09/29/14 0404 09/30/14 0514 10/01/14 0604 10/02/14 0556 10/03/14 0559  NA 137 137 139 140 140  K 4.0 3.8 3.4* 3.5 3.2*  CL 107 105 103 104 101  CO2 23 25 29 28  33*  GLUCOSE 91 88 89 86 92  BUN 18 16 13 13 11   CREATININE 2.03* 1.78* 1.80* 1.62* 1.55*  CALCIUM 7.6* 8.0* 8.0* 7.9* 8.0*   Liver Function Tests:  Recent Labs Lab 09/29/14 0404 09/30/14 0514  AST 38* 32  ALT 12 12  ALKPHOS 29* 30*  BILITOT 0.7 0.5  PROT 5.7* 5.7*  ALBUMIN 3.1* 3.0*   No results for input(s): LIPASE, AMYLASE in the last 168 hours.  Recent Labs Lab 10/01/14 1126  AMMONIA 9*   CBC:  Recent Labs Lab 09/28/14 0309  10/01/14 1126 10/01/14 1900 10/02/14 0558 10/02/14 1745 10/03/14 0601  WBC 9.0  < > 5.0 5.5 5.4 5.4 4.4  NEUTROABS 6.7  --   --   --   --   --   --   HGB 11.3*  < > 8.1* 8.0* 7.7* 7.7* 7.6*  HCT 35.1*  < > 24.9* 24.4* 23.9* 24.2* 24.4*  MCV 95.9  < > 95.8 95.7 96.8 97.2 100.0  PLT 150  < > 132* 127* 152 141* 148*  < > = values in this interval not displayed. Cardiac Enzymes:  Recent Labs Lab 09/28/14 0309  CKTOTAL 188*   BNP: BNP (last 3 results) No results for input(s): BNP in the last 8760 hours.  ProBNP (last 3 results)  Recent Labs  11/27/13 2255  PROBNP 4215.0*    CBG: No results for input(s): GLUCAP in  the last 168 hours.  Signed:  MEMON,JEHANZEB  [facility] 10/03/2014, 12:10 PM

## 2014-10-03 NOTE — Clinical Social Work Note (Addendum)
CSW facilitated discharge.  CSW notified facility that patient was being discharged today.  CSW spoke with patient's son, Teaira Croft, and advised that patient was being discharged today and would be transported to [facility].  CSW also requested that he go to [facility] and sign patient's paperwork. Mr. Walsworth indicated that he would go to [facility] today to complete paperwork.  CSW signing off.    Ambrose Pancoast, [place]

## 2014-10-04 ENCOUNTER — Non-Acute Institutional Stay (SKILLED_NURSING_FACILITY): Payer: Medicare Other | Admitting: Internal Medicine

## 2014-10-04 ENCOUNTER — Encounter (HOSPITAL_COMMUNITY)
Admission: AD | Admit: 2014-10-04 | Discharge: 2014-10-04 | Disposition: A | Discharge status: Home / Self Care | Payer: Medicare Other | Source: Skilled Nursing Facility | Attending: Internal Medicine | Admitting: Internal Medicine

## 2014-10-04 ENCOUNTER — Encounter: Payer: Self-pay | Admitting: Internal Medicine

## 2014-10-04 DIAGNOSIS — I712 Thoracic aortic aneurysm, without rupture, unspecified: Secondary | ICD-10-CM

## 2014-10-04 DIAGNOSIS — I10 Essential (primary) hypertension: Secondary | ICD-10-CM | POA: Diagnosis not present

## 2014-10-04 DIAGNOSIS — A419 Sepsis, unspecified organism: Secondary | ICD-10-CM

## 2014-10-04 DIAGNOSIS — N183 Chronic kidney disease, stage 3 unspecified: Secondary | ICD-10-CM

## 2014-10-04 DIAGNOSIS — S300XXD Contusion of lower back and pelvis, subsequent encounter: Secondary | ICD-10-CM | POA: Diagnosis not present

## 2014-10-04 DIAGNOSIS — I959 Hypotension, unspecified: Principal | ICD-10-CM

## 2014-10-04 DIAGNOSIS — I4891 Unspecified atrial fibrillation: Secondary | ICD-10-CM | POA: Diagnosis not present

## 2014-10-04 LAB — BASIC METABOLIC PANEL
Anion gap: 6 (ref 5–15)
BUN: 11 mg/dL (ref 6–23)
CHLORIDE: 101 mmol/L (ref 96–112)
CO2: 34 mmol/L — AB (ref 19–32)
CREATININE: 1.5 mg/dL — AB (ref 0.50–1.10)
Calcium: 8.5 mg/dL (ref 8.4–10.5)
GFR calc Af Amer: 37 mL/min — ABNORMAL LOW (ref >90)
GFR calc non Af Amer: 32 mL/min — ABNORMAL LOW (ref >90)
GLUCOSE: 94 mg/dL (ref 70–99)
Potassium: 3.8 mmol/L (ref 3.5–5.1)
Sodium: 141 mmol/L (ref 135–145)

## 2014-10-04 LAB — CBC WITH DIFFERENTIAL/PLATELET
BASOS PCT: 1 % (ref 0–1)
Basophils Absolute: 0 10*3/uL (ref 0.0–0.1)
EOS ABS: 0.1 10*3/uL (ref 0.0–0.7)
EOS PCT: 3 % (ref 0–5)
HEMATOCRIT: 25 % — AB (ref 36.0–46.0)
Hemoglobin: 7.8 g/dL — ABNORMAL LOW (ref 12.0–15.0)
Lymphocytes Relative: 23 % (ref 12–46)
Lymphs Abs: 0.9 10*3/uL (ref 0.7–4.0)
MCH: 31.3 pg (ref 26.0–34.0)
MCHC: 31.2 g/dL (ref 30.0–36.0)
MCV: 100.4 fL — AB (ref 78.0–100.0)
MONO ABS: 0.4 10*3/uL (ref 0.1–1.0)
Monocytes Relative: 10 % (ref 3–12)
Neutro Abs: 2.6 10*3/uL (ref 1.7–7.7)
Neutrophils Relative %: 63 % (ref 43–77)
Platelets: 174 10*3/uL (ref 150–400)
RBC: 2.49 MIL/uL — ABNORMAL LOW (ref 3.87–5.11)
RDW: 16.9 % — ABNORMAL HIGH (ref 11.5–15.5)
WBC: 4.1 10*3/uL (ref 4.0–10.5)

## 2014-10-04 NOTE — Progress Notes (Signed)
Patient ID: Brandy Mcguire, female   DOB: Jun 24, 1934, 81 y.o.   MRN: [id number]   This is an acute visit.  Level care skilled.  Facility [facility].  Chief complaint acute visit status post hospitalization for sepsis with hypotension thought secondary to UTI.  History of present illness.  Patient is a pleasant 81 year old female who is admitted to hospital after a fall-she was noted to be confused and hypotensive tachycardic with an elevated lactic acid-it was thought she was septic she was given IV fluids.  Blood cultures and urine culture in the ER did not show any growth she was initially started on IV Zosyn and vancomycin-as her clinical condition approve IV antibiotics were discontinued she has been transitioned to Levaquin and has completed this.  She did have some diarrhea this was C. difficile negative thought possibly related to the antibiotics.  Her mental status initially had significant confusion but apparently were improved to baseline.  Patient did have urinary retention she did have a Foley catheter inserted voiding trial was attempted but failed-she will need outpatient urology follow-up.  Note patient also had a large right gluteal buttocks contusion and hematoma secondary to the fall her hemoglobin drifted down significantly and she required a transfusion of 1 unit packed red blood cells.--Appears her admission hemoglobin was 11.3 drifted down to 7.7 she did receive the transfusion and improved to 9.3 subsequently a drifted back down to 7.6 there is recommendation to the recheck this CBC next week-we did recheck it today and it was 7.8.  She also has thrombocytopenia with platelets going down to 97,000-this is now back up to 174,000 on lab today. Likely consumption related.  Patient does have baseline stage III chronic renal insufficiency lab today creatinine was 1.5 this apparently is roughly her baseline.  In regards to her history of atrial fibrillation she was on  Plavix this is currently being held secondary to her low hemoglobin-her heart rate was borderline elevated in the hospital she had been on Norvasc this was discontinued in favor of low-dose diltiazem-apparently this was effective for her heart rate and blood pressure.  Of note patient does have a history of a thoracoabdominal aortic aneurysm 6.46 cm-she did have evaluation by cardiothoracic-she was not thought to be a candidate for operative repair-at this point recommendation to monitor.  She also has a history of congestive heart failure 2-D echo on May 2011 showed an ejection fraction of 55-65 percent and grade 1 diastolic dysfunction she is on Lasix 20 mg a day.  She is here essentially for strengthening and rehabilitation-vital signs are stable she has no complaints currently.  Previous medical history.  Sepsis associated hypotension resolved thought possibly secondary to UTI.  History of atrial fibrillation.  Acute kidney injury-chronic kidney disease stage III.  Traumatic hematoma buttocks.  Thoracoabdominal aortic aneurysm.  Urinary retention.  Hypothyroidism? No longer on Synthroid TSH normal.  History of dementia.  History CVA.  History coronary artery disease.    Surgical history.  Significant for hysterectomy and apparently some previous history of cardiac stenting   Physical  Medications.  Diltiazem diltiazem 30 mg every 8 hours.  Lasix 20 mg daily.  Mucinex 600 mg twice a day.  Duo-nebs every 6 hours.  Plavix 75 mg daily orders to hold this 1 week if hemoglobin stable restart.  Lipitor 40 mg daily at bedtime.  Senokot daily.  Trazodone 50 mg daily at bedtime.  Review of systems.  In general does not complaining of fever or chills  does have some weakness.  Skin does not complain of rashes or itching does have a hematoma as noted right gluteal buttocks area although this appears to be significantly improved.  Head ears eyes nose mouth and  throat-does not complain of any visual changes or sore throat.  Respiratory does not complain of shortness breath or cough.  Cardiac no chest pain or significant lower extremity edema.  GI does not complain of abdominal discomfort nausea vomiting diarrhea constipation.  GU does not complain currently of dysuria completed antibiotic for possible UTI.  Musculoskeletal other than weakness no complaints does not complain of joint pain.  Neurologic is not complaining of dizziness headache or syncopal-type episodes.  Psych does have some listed history of dementia-I do not see history of depression or anxiety listed.  Physical exam.  Temperature is 97.3 pulse 88 respirations 18 blood pressure 128/74  In general this is a pleasant elderly female in no distress resting comfortably in bed.  Her skin is warm and dry I do note what appears to be fairly well resolved hematoma  buttocks area there is some bruising-she also has a bruise left leg superior to the knee-as well as lower right leg  Eyes pupils appear reactive light sclera and conjunctiva clear visual acuity appears grossly intact she does have prescription lenses--appears she does have right eye proptosis.  Oropharynx is clear mucous membranes moist.  Chest there is some slight expiratory wheezing at the bases there is no labored breathing.  Heart distant heart sounds radial pulse is somewhat regular irregular-she does not have significant lower extremity edema pedal pulses are present.  Abdomen some obese soft nontender positive bowel sounds.  GU-Foley catheter in place draining amber colored urine  Musculoskeletal moves all extremities 4 I did not note any deformities strength appears to be grossly intact upper extremities some lower extremity weakness  Neurologic is grossly intact her speech is clear I could not appreciate any lateralizing findings.  Psych she appears alert and oriented pleasant and  appropriate.  Labs.  10/04/2014.  Sodium 141 potassium 3.8 BUN 11 creatinine 1.5.  WBC 4.1 hemoglobin 7.8 platelets 174.  Assessment plan.  #1-sepsis thought secondary to UTI with hypotension-this appears to be stable she is asymptomatic afebrile white count is not elevated does not complain of dysuria recent blood pressures 128/74-125/54.  I do note she is now on diltiazem Norvasc was discontinued in hospital at this point will monitor.  #2-history of atrial fibrillation-apparently not an aggressive anticoagulation candidate secondary to her aneurysm-Plavix is being held secondary to low hemoglobin this will have to be reassessed in approximately a week.--Rate appears to be controlled  #3.--History of anemia with hematoma-this appears to be stable will update a CBC on Monday, April 1  #4-history of urinary retention she does have a Foley catheter in place recommendation to follow-up with urology.  #5-history of chronic renal insufficiency stage III this appears to be at baseline Will update a metabolic panel next week.  #6-history of coronary artery disease and CHF-at this point medically appears to be stable continue to monitor she is on Lasix again will update a metabolic panel next week.  #7-history of hypertension-at this point as stated above appears to be well controlled she is on diltiazem Norvasc was discontinued in the hospital.  #8-history thrombocytopenia this was thought to be consumptive related--this appears to have normalized.  #9-history of hypothyroidism? TSH was normal in hospital Synthroid been discontinued.  #10-history of chronic dementia with cognitive decline-at this point this  appears to be quite mild at this point will monitor she did not require any antipsychotic medications during her hospitalization number.  #11 history of thoracoabdominal aortic aneurysm-as noted above she has been evaluated by cardiothoracic and recommendation for monitoring not an  operative candidate currently.  [id number] note greater than 40 minutes spent assessing patient-reviewing her chart-and coordinating and formulating a plan of care for numerous diagnoses-of note greater than 50% of time spent coordinating plan of care

## 2014-10-07 ENCOUNTER — Encounter (HOSPITAL_COMMUNITY)
Admission: RE | Admit: 2014-10-07 | Discharge: 2014-10-07 | Disposition: A | Discharge status: Home / Self Care | Payer: Medicare Other | Source: Skilled Nursing Facility | Attending: Internal Medicine | Admitting: Internal Medicine

## 2014-10-07 LAB — BASIC METABOLIC PANEL
ANION GAP: 9 (ref 5–15)
BUN: 14 mg/dL (ref 6–23)
CHLORIDE: 97 mmol/L (ref 96–112)
CO2: 33 mmol/L — ABNORMAL HIGH (ref 19–32)
CREATININE: 1.44 mg/dL — AB (ref 0.50–1.10)
Calcium: 8.7 mg/dL (ref 8.4–10.5)
GFR calc non Af Amer: 34 mL/min — ABNORMAL LOW (ref >90)
GFR, EST AFRICAN AMERICAN: 39 mL/min — AB (ref >90)
Glucose, Bld: 88 mg/dL (ref 70–99)
Potassium: 3.7 mmol/L (ref 3.5–5.1)
Sodium: 139 mmol/L (ref 135–145)

## 2014-10-07 LAB — CBC WITH DIFFERENTIAL/PLATELET
Basophils Absolute: 0 10*3/uL (ref 0.0–0.1)
Basophils Relative: 1 % (ref 0–1)
Eosinophils Absolute: 0.2 10*3/uL (ref 0.0–0.7)
Eosinophils Relative: 4 % (ref 0–5)
HCT: 27.4 % — ABNORMAL LOW (ref 36.0–46.0)
HEMOGLOBIN: 8.6 g/dL — AB (ref 12.0–15.0)
LYMPHS PCT: 34 % (ref 12–46)
Lymphs Abs: 1.5 10*3/uL (ref 0.7–4.0)
MCH: 31.6 pg (ref 26.0–34.0)
MCHC: 31.4 g/dL (ref 30.0–36.0)
MCV: 100.7 fL — ABNORMAL HIGH (ref 78.0–100.0)
Monocytes Absolute: 0.5 10*3/uL (ref 0.1–1.0)
Monocytes Relative: 11 % (ref 3–12)
NEUTROS ABS: 2.2 10*3/uL (ref 1.7–7.7)
Neutrophils Relative %: 50 % (ref 43–77)
Platelets: 206 10*3/uL (ref 150–400)
RBC: 2.72 MIL/uL — ABNORMAL LOW (ref 3.87–5.11)
RDW: 17.2 % — ABNORMAL HIGH (ref 11.5–15.5)
WBC: 4.4 10*3/uL (ref 4.0–10.5)

## 2014-10-09 ENCOUNTER — Non-Acute Institutional Stay (SKILLED_NURSING_FACILITY): Payer: Medicare Other | Admitting: Internal Medicine

## 2014-10-09 DIAGNOSIS — I9589 Other hypotension: Secondary | ICD-10-CM

## 2014-10-09 DIAGNOSIS — S300XXD Contusion of lower back and pelvis, subsequent encounter: Secondary | ICD-10-CM | POA: Diagnosis not present

## 2014-10-09 DIAGNOSIS — I4891 Unspecified atrial fibrillation: Secondary | ICD-10-CM | POA: Diagnosis not present

## 2014-10-09 DIAGNOSIS — N183 Chronic kidney disease, stage 3 unspecified: Secondary | ICD-10-CM

## 2014-10-14 ENCOUNTER — Encounter (HOSPITAL_COMMUNITY)
Admission: RE | Admit: 2014-10-14 | Discharge: 2014-10-14 | Disposition: A | Discharge status: Home / Self Care | Payer: Medicare Other | Source: Skilled Nursing Facility | Attending: Internal Medicine | Admitting: Internal Medicine

## 2014-10-14 LAB — CBC WITH DIFFERENTIAL/PLATELET
Basophils Absolute: 0 10*3/uL (ref 0.0–0.1)
Basophils Relative: 1 % (ref 0–1)
EOS PCT: 4 % (ref 0–5)
Eosinophils Absolute: 0.2 10*3/uL (ref 0.0–0.7)
HEMATOCRIT: 29.8 % — AB (ref 36.0–46.0)
HEMOGLOBIN: 9.4 g/dL — AB (ref 12.0–15.0)
LYMPHS PCT: 30 % (ref 12–46)
Lymphs Abs: 1.5 10*3/uL (ref 0.7–4.0)
MCH: 32 pg (ref 26.0–34.0)
MCHC: 31.5 g/dL (ref 30.0–36.0)
MCV: 101.4 fL — ABNORMAL HIGH (ref 78.0–100.0)
Monocytes Absolute: 0.5 10*3/uL (ref 0.1–1.0)
Monocytes Relative: 9 % (ref 3–12)
NEUTROS PCT: 56 % (ref 43–77)
Neutro Abs: 2.8 10*3/uL (ref 1.7–7.7)
Platelets: 238 10*3/uL (ref 150–400)
RBC: 2.94 MIL/uL — AB (ref 3.87–5.11)
RDW: 17.7 % — ABNORMAL HIGH (ref 11.5–15.5)
WBC: 5 10*3/uL (ref 4.0–10.5)

## 2014-10-14 LAB — BASIC METABOLIC PANEL
Anion gap: 8 (ref 5–15)
BUN: 25 mg/dL — AB (ref 6–23)
CHLORIDE: 97 mmol/L (ref 96–112)
CO2: 31 mmol/L (ref 19–32)
CREATININE: 2.06 mg/dL — AB (ref 0.50–1.10)
Calcium: 9 mg/dL (ref 8.4–10.5)
GFR calc Af Amer: 25 mL/min — ABNORMAL LOW (ref >90)
GFR calc non Af Amer: 22 mL/min — ABNORMAL LOW (ref >90)
Glucose, Bld: 87 mg/dL (ref 70–99)
Potassium: 3.7 mmol/L (ref 3.5–5.1)
Sodium: 136 mmol/L (ref 135–145)

## 2014-10-15 ENCOUNTER — Non-Acute Institutional Stay (SKILLED_NURSING_FACILITY): Payer: Medicare Other | Admitting: Internal Medicine

## 2014-10-15 ENCOUNTER — Encounter: Payer: Self-pay | Admitting: Internal Medicine

## 2014-10-15 DIAGNOSIS — N183 Chronic kidney disease, stage 3 unspecified: Secondary | ICD-10-CM

## 2014-10-15 DIAGNOSIS — D62 Acute posthemorrhagic anemia: Secondary | ICD-10-CM

## 2014-10-15 DIAGNOSIS — I951 Orthostatic hypotension: Secondary | ICD-10-CM

## 2014-10-15 DIAGNOSIS — R231 Pallor: Secondary | ICD-10-CM

## 2014-10-15 DIAGNOSIS — I10 Essential (primary) hypertension: Secondary | ICD-10-CM

## 2014-10-15 DIAGNOSIS — I4891 Unspecified atrial fibrillation: Secondary | ICD-10-CM

## 2014-10-15 NOTE — Progress Notes (Signed)
Patient ID: Brandy Mcguire, female   DOB: 10-Mar-1934, 81 y.o.   MRN: [id number]                 HISTORY & PHYSICAL  DATE:  10/09/2014           FACILITY: [facility]            LEVEL OF CARE:   SNF   CHIEF COMPLAINT:  Admission to SNF, post stay at [facility], 09/28/2014 through 10/03/2014.          HISTORY OF PRESENT ILLNESS:  This is a patient who apparently was admitted to hospital after falling.  She was noted to be more confused than her baseline.  She was hypotensive in the emergency room, tachycardic, and had an elevated lactic acid level.   It was felt that she had sepsis.  She was given IV fluids and admitted to the hospital.  She was treated with IV Zosyn and vancomycin although blood cultures, urine cultures, and subsequently a stool for C.diff were all negative.  She was discharged on Levaquin for a further seven days.     It would appear that she was admitted to hospital with a hemoglobin of 9.8.  This dropped fairly rapidly to 7.7.  I believe she was transfused at that point, but her hemoglobin continued to drop throughout the hospitalization and her discharge hemoglobin was 7.8.  That was repeated on 10/07/2014 at 8.8.  MCV was 100, white count normal.  The source of this was felt to be a large gluteal hematoma.  In fact, her Plavix has been on hold.  She ended up getting transfused 1 U of packed cells, per her discharge summary.  The patient is on Plavix for a history of atrial fibrillation and instructions are to restart that based on follow-up hemoglobin.         Other issues in the hospital included acute-on-chronic delirium in the setting of baseline dementia.    She was noted to be hypoxemic and discharged on oxygen.    She was also noted to be in urinary retention with a Foley placed for more than 700 cc.  She failed a voiding trial and it was recommended that she follow up with Urology.    The patient had a CT scan of the abdomen showing a 6.4 x 6 cm  thoracoabdominal aortic aneurysm.  Previously measured 4.2 cm.  It was not felt that she was a candidate for operative repair given her comorbid conditions.    Hypertension was not an issue.    PAST MEDICAL HISTORY/PROBLEM LIST:          Felt to have sepsis, associated hypotension with an elevated lactic acid level, although no source of this was identified.    Coronary artery disease and a history of CHF.  Echocardiogram done in May 2011 showed a normal EF with diastolic dysfunction.    History of CVA.    History of dementia with behavioral disturbances.    History of volume depletion.    History of recurrent UTIs.  However, urine culture on this admission was negative.    Paroxysmal atrial fibrillation.  On Plavix.    Hypothyroidism.    Acute-on-chronic renal failure.    Chronic renal failure stage III.  Discharged with a creatinine of 1.44.    Traumatic hematoma of the back, buttock.    Thoracoabdominal aortic aneurysm.  Not felt to be a surgical candidate.    Acute blood loss  anemia.    Urinary retention, as described.    CURRENT MEDICATIONS:  Discharge medications include:     Cardizem 30 q.8.    Lasix 20 q.d.      Guaifenesin 600 b.i.d.       Duonebs q.6.     Plavix 75.  On hold until  her hemoglobin is noted to be stable.    Lipitor 40 q.d.       Senokot-S 1 tablet daily.    Trazodone 50 mg at bedtime.    Dulcolax 10 mg orally as needed for constipation.    Medications that were stopped or are on hold include:    Plavix.    Norvasc.    SOCIAL HISTORY:     HOUSING:  She states that she lives with her son in [place].  I am not sure how accurate this is.  Beyond this, I have no functional information.    FUNCTIONAL STATUS:  I have no information on this patient at present in terms of function.   ADVANCED DIRECTIVES:   There are no advanced directives on the chart.    FAMILY HISTORY:   Not currently available.     REVIEW OF SYSTEMS:              CHEST/RESPIRATORY:  The patient denies cough or shortness of breath.     CARDIAC:  No chest pain.    GI:  No abdominal pain or diarrhea.     GU:  Has a Foley catheter in place.  She does not recall any prior difficulties with voiding.      PHYSICAL EXAMINATION:   VITAL SIGNS:     PULSE:  Atrial fibrillation in the 60s.  Very thready to palpation.   RESPIRATIONS:  18 and unlabored.   02 SATURATIONS:  95%.   GENERAL APPEARANCE:  The patient is awake, alert.  Her attention span is normal.  Any degree of delirium she had would have superficially appeared to be improved.   HEENT:   MOUTH/THROAT:    Mucous membranes are normal.   CHEST/RESPIRATORY:   Diffuse crackles over the right lung.  Chest x-ray from 10/02/2014 was unremarkable.   CARDIOVASCULAR:   CARDIAC:  Atrial fibrillation.  Soft midsystolic ejection murmur that does not radiate.  She appears to be euvolemic.     GASTROINTESTINAL:   ABDOMEN:  Soft.   LIVER/SPLEEN/KIDNEY:   No liver, no spleen.  No tenderness.    GENITOURINARY:   BLADDER:  A Foley catheter is in place.   SKIN:   INSPECTION:  A large hematoma over the right buttock, although nothing looks threatening here.  I have no problem believing that this was the source of a considerable amount of hemoglobin loss, perhaps even her hypotension.   NEUROLOGICAL:   SENSATION/STRENGTH:  She has antigravity strength.   DEEP TENDON REFLEXES:  Reflexes symmetric.  Right plantar is upgoing, left is downgoing.   PSYCHIATRIC:   MENTAL STATUS:    Compatible with moderate to severe dementia.  She cannot tell me her age, date of birth.  She was able to tell me that she lived with her son, had five children, etc.    ASSESSMENT/PLAN:                    Hypotension.  Listed as sepsis, although no source was identified.    Colossal hemoglobin loss in the gluteal area.  I have no trouble believing that this was the source of hemoglobin decrease  and perhaps even her hypotension.  This does not  look threatening and her skin does not look threatening.    Urinary retention.  Now with a Foley catheter in place.  I will try to probably give her a voiding trial next week when she is more stronger.    Acute-on-chronic kidney disease stage III.  Presumably secondary to hypertension, although I do not actually even see this listed as a diagnosis.  She was on antihypertensive medications, PTA.    Thoracoabdominal aortic aneurysm.    She is not felt to be a surgical candidate.  I think she was reviewed during this hospitalization.    History of recurrent UTIs, although she has none of this currently.     Delirium in the hospital.  Seems to have resolved.    Background dementia, which is probably moderate to severe.  Her background functional level, psychosocial status will need to be clarified.    Diffuse right lung crackles, which would make me wonder about an aspiration event.  Her O2 sat is 95% on her 2 L of oxygen.  I am uncertain about the history here.  Chest x-rays in the hospital were clear.    We have already followed up on her lab work.  Her BUN was 14 on 10/07/2014, creatinine 1.44.    Her hemoglobin was 8.6.  This is up from 7.8 in the hospital.  If this is stable on Monday, I think her Plavix can probably be restarted.  She is not on iron.  I will start this.     CPT CODE: 32951

## 2014-10-15 NOTE — Progress Notes (Signed)
Patient ID: Brandy Mcguire, female   DOB: [birth year], 81 y.o.   MRN: [id number]   This is an acute visit.  Level care skilled.  Facility [facility].  Chief complaint acute visit secondary to hypotension-nursing staff feeling patient appears more pale-question jaundice.  History of present illness.  Patient is a pleasant 81 year old female who was admitted here after hospitalization for fall she was found to be hypotensive and tachycardic with an elevated lactic acid-she was thought to be septic-blood cultures and urine culture were negative-she was started on IV antibiotics and clinically improved.  She also had urinary retention in continues with a Foley catheter.  Of note she had a large right gluteal buttocks contusion and hematoma secondary to the fall hemoglobin dropped significantly and she required transfusion-it appears her hemoglobin had been down to 7.6-however she is now trending up and actually hemoglobin as of yesterday was 9.4-there is recommendation to restart her Plavix if hemoglobin is stable and we will restart that Of note she has been started on aspirin.  She does have a history of atrial fibrillation.  She was thought hypotensive in the hospital thought possibly secondary to sepsis UTI.  However per review of blood pressure serially appear to be somewhat variable I see one listed at 70/42 this morning apparently she was asymptomatic I also see 93/56 111/58.  I did assess her this afternoon and when she was lying down I actually got a blood pressure of 128/90-however when she was sitting up this dropped to 88/56.  She is on diltiazem with a history of atrial fibrillation-30 mg every 8 hours-apparently she had been on Norvasc at one point but this was discontinued.  Nursing staff also feel she appears to be more pale question jaundice-I have  extensively reviewed her chart in Epic and do not really see any previous liver issues-I also reviewed her recent liver  function tests most recently on April 4 which were normal except for an albumin of 3 and alkaline phosphatase phosphatase of 30 and appears a year ago her liver function tests were essentially normal.  This afternoon she does not have any acute complaints does not really complain of feeling dizzy although apparently when she is sitting up she complains of this occasionally-she does say she feels kind of weak.  Family medical social history as been reviewed per admission note on 10/09/2014.  Medications have been reviewed per [facility] again this includes atorvastatin diltiazem Lasix iron and Mucinex as well as potassium supplementation.  Review of systems this is somewhat limited secondary to patient's dementia.  General does not complaining of any fever or chills does complain of weakness.  Skin does have a history of a right buttocks gluteal hematoma which appears to be resolving fairly unremarkably.  Head ears eyes nose mouth and throat does not complaining of sore throat or drainage.  Respiratory does not complain of shortness breath or cough.  Cardiac no chest pain has very mild lower extremity edema.  GI is not really complaining of any nausea vomiting diarrhea constipation or abdominal discomfort.  GU currently does not complain of dysuria.  Musculoskeletal has weakness but does not really complain of joint pain.  Neurologic is not complaining of headache or specific syncopal episodes complains occasionally of symptoms dizziness when she is sitting up or standing.  Psych she does have a history of dementia.  Physical exam.  Temperature is 98.3 pulse 90 respirations 18 blood pressures variable as noted above when she was lying down I  got 128/90 when she was sitting up I got 88/56.  In general this is a pleasant elderly female in no distress.  Her skin is warm and dry hematoma the right gluteal area appears to be resolving unremarkably there is still some slight evidence bruising  here She is pale however I could not really see overt evidence of jaundice.  Eyes sclera and conjunctiva appear clear pupils reactive.  Oropharynx clear mucous membranes moist.  Chest is clear to auscultation with reduced air entry there is no labored breathing.  Heart continues to have somewhat distant heart sounds were what I could hear was irregular with pulse roughly in the 90s she has minimal lower extremity edema.  Her abdomen soft nontender with active bowel sounds  Musculoskeletal moves all her extremities 4 does have some lower extremity weakness I did not note any deformities.  Neurologic is grossly intact her speech is clear I could not really appreciate any lateralizing findings she does have some generalized weakness especially lower extremities.  Psych she is oriented to self pleasant appropriate but appears to be somewhat confused.  Labs.  10/14/2014.  Sodium 136 potassium 3.7 BUN 25 creatinine 2.06.  WBC 5.0 hemoglobin 9.4 platelets 238.  Assessment and plan.  . 1-hypertension-this appears to be orthostatic with order blood pressures lying and sitting daily with a log for review-she is on diltiazem for rate control--atrial fibrillation- and appears she has frequent pulses in the 80s to low 100s would be hesitant to change  This  For now-- it appears her blood pressure when she is lying is actually is normal to slightly elevated although we will have to get more readings   #2-anemia-hemoglobin continues to trend up-there is recommendation to restart her Plavix when hemoglobin stabilizes this appears to be the case will restart the Plavix and update a CBC first laboratory day next week.--She continues on iron  #3-question jaundice-will update liver function tests and amylase and lipase-also will update her renal function she does have a history of renal insufficiency appears her baseline creatinine is 1. 5-2 she appears to be on the higher end of her baseline would  like to see where we stand here.  [id number] note greater than 40 minutes spent assessing patient-discussing her status with nursing staff-reviewing her chart-and coordinating and formulating a plan of care for numerous diagnoses-of note greater than 50% of time spent coordinating plan of care

## 2014-10-16 ENCOUNTER — Encounter (HOSPITAL_COMMUNITY)
Admission: AD | Admit: 2014-10-16 | Discharge: 2014-10-16 | Disposition: A | Discharge status: Home / Self Care | Payer: Medicare Other | Source: Skilled Nursing Facility | Attending: Internal Medicine | Admitting: Internal Medicine

## 2014-10-16 LAB — COMPREHENSIVE METABOLIC PANEL
ALK PHOS: 44 U/L (ref 39–117)
ALT: 10 U/L (ref 0–35)
ANION GAP: 8 (ref 5–15)
AST: 24 U/L (ref 0–37)
Albumin: 3.6 g/dL (ref 3.5–5.2)
BILIRUBIN TOTAL: 1.2 mg/dL (ref 0.3–1.2)
BUN: 28 mg/dL — ABNORMAL HIGH (ref 6–23)
CHLORIDE: 100 mmol/L (ref 96–112)
CO2: 32 mmol/L (ref 19–32)
Calcium: 9 mg/dL (ref 8.4–10.5)
Creatinine, Ser: 2.13 mg/dL — ABNORMAL HIGH (ref 0.50–1.10)
GFR calc Af Amer: 24 mL/min — ABNORMAL LOW (ref >90)
GFR, EST NON AFRICAN AMERICAN: 21 mL/min — AB (ref >90)
Glucose, Bld: 88 mg/dL (ref 70–99)
POTASSIUM: 4 mmol/L (ref 3.5–5.1)
Sodium: 140 mmol/L (ref 135–145)
Total Protein: 6.9 g/dL (ref 6.0–8.3)

## 2014-10-16 LAB — CBC WITH DIFFERENTIAL/PLATELET
BASOS PCT: 0 % (ref 0–1)
Basophils Absolute: 0 10*3/uL (ref 0.0–0.1)
EOS ABS: 0.2 10*3/uL (ref 0.0–0.7)
Eosinophils Relative: 3 % (ref 0–5)
HCT: 31.8 % — ABNORMAL LOW (ref 36.0–46.0)
Hemoglobin: 10 g/dL — ABNORMAL LOW (ref 12.0–15.0)
Lymphocytes Relative: 17 % (ref 12–46)
Lymphs Abs: 1 10*3/uL (ref 0.7–4.0)
MCH: 31.8 pg (ref 26.0–34.0)
MCHC: 31.4 g/dL (ref 30.0–36.0)
MCV: 101.3 fL — ABNORMAL HIGH (ref 78.0–100.0)
Monocytes Absolute: 0.4 10*3/uL (ref 0.1–1.0)
Monocytes Relative: 6 % (ref 3–12)
NEUTROS PCT: 74 % (ref 43–77)
Neutro Abs: 4.3 10*3/uL (ref 1.7–7.7)
PLATELETS: 245 10*3/uL (ref 150–400)
RBC: 3.14 MIL/uL — ABNORMAL LOW (ref 3.87–5.11)
RDW: 17.5 % — AB (ref 11.5–15.5)
WBC: 5.9 10*3/uL (ref 4.0–10.5)

## 2014-10-16 LAB — AMYLASE: Amylase: 75 U/L (ref 0–105)

## 2014-10-16 LAB — LIPASE, BLOOD: Lipase: 29 U/L (ref 11–59)

## 2014-10-17 LAB — URINE MICROSCOPIC-ADD ON

## 2014-10-17 LAB — URINALYSIS, ROUTINE W REFLEX MICROSCOPIC
BILIRUBIN URINE: NEGATIVE
Glucose, UA: NEGATIVE mg/dL
KETONES UR: NEGATIVE mg/dL
Nitrite: NEGATIVE
SPECIFIC GRAVITY, URINE: 1.015 (ref 1.005–1.030)
UROBILINOGEN UA: 0.2 mg/dL (ref 0.0–1.0)
pH: 6 (ref 5.0–8.0)

## 2014-10-18 LAB — URINE CULTURE
Colony Count: NO GROWTH
Culture: NO GROWTH

## 2014-10-22 ENCOUNTER — Encounter (HOSPITAL_COMMUNITY)
Admission: AD | Admit: 2014-10-22 | Discharge: 2014-10-22 | Disposition: A | Discharge status: Home / Self Care | Payer: Medicare Other | Source: Skilled Nursing Facility | Attending: Internal Medicine | Admitting: Internal Medicine

## 2014-10-22 LAB — COMPREHENSIVE METABOLIC PANEL
ALT: 11 U/L (ref 0–35)
AST: 25 U/L (ref 0–37)
Albumin: 3.6 g/dL (ref 3.5–5.2)
Alkaline Phosphatase: 45 U/L (ref 39–117)
Anion gap: 8 (ref 5–15)
BUN: 36 mg/dL — AB (ref 6–23)
CO2: 32 mmol/L (ref 19–32)
Calcium: 8.6 mg/dL (ref 8.4–10.5)
Chloride: 96 mmol/L (ref 96–112)
Creatinine, Ser: 1.93 mg/dL — ABNORMAL HIGH (ref 0.50–1.10)
GFR calc Af Amer: 27 mL/min — ABNORMAL LOW (ref >90)
GFR, EST NON AFRICAN AMERICAN: 24 mL/min — AB (ref >90)
GLUCOSE: 91 mg/dL (ref 70–99)
Potassium: 4.7 mmol/L (ref 3.5–5.1)
Sodium: 136 mmol/L (ref 135–145)
Total Bilirubin: 1 mg/dL (ref 0.3–1.2)
Total Protein: 7 g/dL (ref 6.0–8.3)

## 2014-10-22 LAB — CBC WITH DIFFERENTIAL/PLATELET
Basophils Absolute: 0 10*3/uL (ref 0.0–0.1)
Basophils Relative: 1 % (ref 0–1)
EOS PCT: 3 % (ref 0–5)
Eosinophils Absolute: 0.1 10*3/uL (ref 0.0–0.7)
HCT: 30.8 % — ABNORMAL LOW (ref 36.0–46.0)
Hemoglobin: 9.6 g/dL — ABNORMAL LOW (ref 12.0–15.0)
LYMPHS ABS: 1.6 10*3/uL (ref 0.7–4.0)
Lymphocytes Relative: 32 % (ref 12–46)
MCH: 31.6 pg (ref 26.0–34.0)
MCHC: 31.2 g/dL (ref 30.0–36.0)
MCV: 101.3 fL — AB (ref 78.0–100.0)
MONOS PCT: 12 % (ref 3–12)
Monocytes Absolute: 0.6 10*3/uL (ref 0.1–1.0)
Neutro Abs: 2.8 10*3/uL (ref 1.7–7.7)
Neutrophils Relative %: 54 % (ref 43–77)
Platelets: 259 10*3/uL (ref 150–400)
RBC: 3.04 MIL/uL — AB (ref 3.87–5.11)
RDW: 16.9 % — AB (ref 11.5–15.5)
WBC: 5.2 10*3/uL (ref 4.0–10.5)

## 2014-10-22 LAB — AMYLASE: Amylase: 83 U/L (ref 0–105)

## 2014-10-22 LAB — LIPASE, BLOOD: LIPASE: 37 U/L (ref 11–59)

## 2014-10-30 ENCOUNTER — Encounter: Payer: Self-pay | Admitting: Internal Medicine

## 2014-10-30 ENCOUNTER — Non-Acute Institutional Stay (SKILLED_NURSING_FACILITY): Payer: Medicare Other | Admitting: Internal Medicine

## 2014-10-30 DIAGNOSIS — S300XXS Contusion of lower back and pelvis, sequela: Secondary | ICD-10-CM | POA: Diagnosis not present

## 2014-10-30 DIAGNOSIS — N183 Chronic kidney disease, stage 3 unspecified: Secondary | ICD-10-CM

## 2014-10-30 DIAGNOSIS — I10 Essential (primary) hypertension: Secondary | ICD-10-CM

## 2014-10-30 DIAGNOSIS — R339 Retention of urine, unspecified: Secondary | ICD-10-CM | POA: Diagnosis not present

## 2014-10-30 DIAGNOSIS — I4891 Unspecified atrial fibrillation: Secondary | ICD-10-CM

## 2014-10-30 NOTE — Progress Notes (Signed)
Patient ID: Brandy Mcguire, female   DOB: 1933-07-10, 81 y.o.   MRN: [id number]       This is a discharge note  Level care skilled.  Facility [facility].  Chief complaint --Discharge note    History of present illness.  Patient is a pleasant 81 year old female who is admitted to hospital after a fall-she was noted to be confused and hypotensive tachycardic with an elevated lactic acid-it was thought she was septic she was given IV fluids.  Blood cultures and urine culture in the ER did not show any growth she was initially started on IV Zosyn and vancomycin-as her clinical condition approve IV antibiotics were discontinued she has been transitioned to Levaquin and has completed this.  She did have some diarrhea this was C. difficile negative thought possibly related to the antibiotics.  Her mental status initially had significant confusion but apparently were improved to baseline.  Patient did have urinary retention she did have a Foley catheter inserted voiding trial was attempted but failed-she will need outpatient urology follow-up.  Note patient also had a large right gluteal buttocks contusion and hematoma secondary to the fall her hemoglobin drifted down significantly and she required a transfusion of 1 unit packed red blood cells.--Appears her admission hemoglobin was 11.3 drifted down to 7.7 she did receive the transfusion and improved to 9.3 subsequently a drifted back down to 7.6 --since then and has trended up most recently 9.6 we will recheck this before discharge  She also has thrombocytopenia with platelets going down to 97,000-this is now back up to 259,000 and this was thought to be consumptive etiology.  Patient does have baseline stage III chronic renal insufficiency --this has fluctuated during her stay here with a creatinine ranging in the 1. 5-2 area-most recently 1.93 on April 26-we will recheck this before discharge as well  In regards to her history of  atrial fibrillation she was on Plavix  This was initially held secondary to low hemoglobin but has since been restarted-she is on diltiazem for rate control.  Of note patient does have a history of a thoracoabdominal aortic aneurysm 6.46 cm-she did have evaluation by cardiothoracic-she was not thought to be a candidate for operative repair-at this point recommendation to monitor.  She also has a history of congestive heart failure 2-D echo on May 2011 showed an ejection fraction of 55-65 percent and grade 1 diastolic dysfunction she is on Lasix 20 mg a day with potassium supplementation her weight as of yesterday was 169.6 this is down about 11 pounds since her admission but appears to have stabilized relatively over the past week.  She was here essentially for rehabilitation and strengthening-she has made some progress however continues to be fairly weak she does use a walker in the wheelchair at this point-she will need continued PT and OT as well as RN support for multiple medical issues.  Currently she has no complaints is looking forward to going home she will be with her son she will need chronic O2 she is oxygen dependent.  Also will need a nebulizer  Previous medical history.  Sepsis associated hypotension resolved thought possibly secondary to UTI.  History of atrial fibrillation.  Acute kidney injury-chronic kidney disease stage III.  Traumatic hematoma buttocks.  Thoracoabdominal aortic aneurysm.  Urinary retention.  Hypothyroidism? No longer on Synthroid TSH normal.  History of dementia.  History CVA.  History coronary artery disease.    Surgical history.  Significant for hysterectomy and apparently some previous  history of cardiac stenting   Physical  Medications.  Diltiazem diltiazem 30 mg every 8 hours.  Lasix 20 mg daily.  Mucinex 600 mg twice a day.  Duo-nebs every 6 hours.  Plavix 75 mg daily .  Lipitor 40 mg daily at bedtime.  Senokot  daily.  Trazodone 50 mg daily at bedtime.  Iron 325 mg daily  Review of systems.  In general does not complaining of fever or chills does have some weakness.  Skin does not complain of rashes or itching --hematoma right buttocks appears to be resolved  Head ears eyes nose mouth and throat-does not complain of any visual changes or sore throat.  Respiratory does not complain of shortness breath or cough.  Cardiac no chest pain or significant lower extremity edema.  GI does not complain of abdominal discomfort nausea vomiting diarrhea constipation.  GU does not complain currently of dysuria completed antibiotic for possible UTI.  Musculoskeletal other than weakness no complaints does not complain of joint pain.  Neurologic is not complaining of dizziness headache or syncopal-type episodes.  Psych does have some listed history of dementia-I do not see history of depression or anxiety listed.  Physical exam.  Temperature 97.9 pulse 88 respirations 20 blood pressure 109/59-105/68-120/69-weight is 169.6  In general this is a pleasant elderly female in no distress resting comfortably in bed.  Her skin is warm and dry I  Hematoma right buttocks appears to be essentially resolved she has some scattered small bruising which does not appear to be new  Eyes pupils appear reactive light sclera and conjunctiva clear visual acuity appears grossly intact she does have prescription lenses--appears she does have right eye proptosis.  Oropharynx is clear mucous membranes moist.  Chest --somewhat shallow air entry I did not appreciate any congestion there is no labored breathing.  Heart distant heart sounds radial pulse is somewhat regular irregular-she does not have significant lower extremity edema pedal pulses are present.  Abdomen some obese soft nontender positive bowel sounds.  GU-Foley catheter in place draining amber colored urine  Musculoskeletal moves all extremities 4 I did  not note any deformities strength appears to be grossly intact upper extremities some lower extremity weakness persists-again she is ambulating at times with a walker but appears to need assistance with extended ambulation  Neurologic is grossly intact her speech is clear I could not appreciate any lateralizing findings.  Psych she appears alert and oriented pleasant and appropriate.  Labs  10/22/2014.  Sodium 136 potassium 4.7 BUN 36 creatinine 1.93.  Liver function tests within normal limits.  WBC 5.2 hemoglobin 9.6 platelets 259.  Lipase 37--amylase 83.  10/04/2014.  Sodium 141 potassium 3.8 BUN 11 creatinine 1.5.  WBC 4.1 hemoglobin 7.8 platelets 174.  Assessment plan.  #1-sepsis thought secondary to UTI with hypotension- His appears to be stabilize blood pressures appear to be relatively stable she is afebrile white count has normalized.  I do note she is now on diltiazem Norvasc was discontinued in hospital   #2-history of atrial fibrillation-apparently not an aggressive anticoagulation candidate secondary to her aneurysm She continues on diltiazem she now is on Plavix which was initially held secondary to low hemoglobin which is rising  #3.--History of anemia with hematoma-hemoglobin appears to be rising most recently 9.6 on April 26 will recheck this  #4-history of urinary retention she does have a Foley catheter in place recommendation to follow-up with urology.  #5-history of chronic renal insufficiency stage III --as noted above creatinine appears to run  1. 5-2 area recently Will update this before discharge  #6-history of coronary artery disease and CHF-at this point medically appears to be stable continue to monitor she is on Lasix again will update a metabolic panel.  #7-history of hypertension-at this point as stated above appears to be well controlled she is on diltiazem Norvasc was discontinued in the hospital.  #8-history thrombocytopenia this was thought  to be consumptive related--this appears to have normalized.  #9-history of hypothyroidism? TSH was normal in hospital Synthroid has been discontinued.  #10-history of chronic dementia with cognitive decline-at this point this appears to be quite mild at this point will monitor she did not require any antipsychotic medications during her hospitalization number.  #11 history of thoracoabdominal aortic aneurysm-as noted above she has been evaluated by cardiothoracic and recommendation for monitoring not an operative candidate currently.   Patient will be going home with her son who apparently has lived with her for some time-she will need O2 secondary to chronic oxygen dependency to keep stats above 93%-also will need PT OT for continued strengthening still has significant weakness as well as RN support for her medical issues-she has all the equipment at home including oxygen hospital bed wheelchair and rolling walker.  [id number] note greater than 30 minutes spent on this discharge summary

## 2014-10-31 ENCOUNTER — Encounter (HOSPITAL_COMMUNITY)
Admission: AD | Admit: 2014-10-31 | Discharge: 2014-10-31 | Disposition: A | Discharge status: Home / Self Care | Payer: Medicare Other | Source: Skilled Nursing Facility | Attending: Internal Medicine | Admitting: Internal Medicine

## 2014-10-31 LAB — CBC WITH DIFFERENTIAL/PLATELET
Basophils Absolute: 0 10*3/uL (ref 0.0–0.1)
Basophils Relative: 1 % (ref 0–1)
EOS ABS: 0.1 10*3/uL (ref 0.0–0.7)
EOS PCT: 2 % (ref 0–5)
HEMATOCRIT: 32.8 % — AB (ref 36.0–46.0)
HEMOGLOBIN: 10.4 g/dL — AB (ref 12.0–15.0)
Lymphocytes Relative: 29 % (ref 12–46)
Lymphs Abs: 1.7 10*3/uL (ref 0.7–4.0)
MCH: 31.5 pg (ref 26.0–34.0)
MCHC: 31.7 g/dL (ref 30.0–36.0)
MCV: 99.4 fL (ref 78.0–100.0)
Monocytes Absolute: 0.5 10*3/uL (ref 0.1–1.0)
Monocytes Relative: 8 % (ref 3–12)
Neutro Abs: 3.5 10*3/uL (ref 1.7–7.7)
Neutrophils Relative %: 60 % (ref 43–77)
Platelets: 196 10*3/uL (ref 150–400)
RBC: 3.3 MIL/uL — AB (ref 3.87–5.11)
RDW: 16.4 % — AB (ref 11.5–15.5)
WBC: 5.8 10*3/uL (ref 4.0–10.5)

## 2014-10-31 LAB — BASIC METABOLIC PANEL
Anion gap: 8 (ref 5–15)
BUN: 33 mg/dL — ABNORMAL HIGH (ref 6–20)
CO2: 30 mmol/L (ref 22–32)
CREATININE: 1.94 mg/dL — AB (ref 0.44–1.00)
Calcium: 8.8 mg/dL — ABNORMAL LOW (ref 8.9–10.3)
Chloride: 98 mmol/L — ABNORMAL LOW (ref 101–111)
GFR calc Af Amer: 27 mL/min — ABNORMAL LOW (ref >60)
GFR calc non Af Amer: 23 mL/min — ABNORMAL LOW (ref >60)
Glucose, Bld: 91 mg/dL (ref 70–99)
Potassium: 4 mmol/L (ref 3.5–5.1)
Sodium: 136 mmol/L (ref 135–145)

## 2014-11-05 DIAGNOSIS — R339 Retention of urine, unspecified: Secondary | ICD-10-CM | POA: Diagnosis not present

## 2014-11-05 DIAGNOSIS — I4891 Unspecified atrial fibrillation: Secondary | ICD-10-CM

## 2014-11-05 DIAGNOSIS — I2510 Atherosclerotic heart disease of native coronary artery without angina pectoris: Secondary | ICD-10-CM

## 2014-11-05 DIAGNOSIS — I509 Heart failure, unspecified: Secondary | ICD-10-CM | POA: Diagnosis not present

## 2014-12-06 ENCOUNTER — Other Ambulatory Visit (HOSPITAL_COMMUNITY)
Admission: RE | Admit: 2014-12-06 | Discharge: 2014-12-06 | Disposition: A | Discharge status: Home / Self Care | Payer: Medicare Other | Source: Other Acute Inpatient Hospital | Attending: Family Medicine | Admitting: Family Medicine

## 2014-12-06 DIAGNOSIS — R339 Retention of urine, unspecified: Secondary | ICD-10-CM | POA: Insufficient documentation

## 2014-12-06 LAB — URINALYSIS W MICROSCOPIC (NOT AT ARMC)
BILIRUBIN URINE: NEGATIVE
GLUCOSE, UA: NEGATIVE mg/dL
KETONES UR: NEGATIVE mg/dL
Nitrite: NEGATIVE
PH: 5.5 (ref 5.0–8.0)
Protein, ur: NEGATIVE mg/dL
SPECIFIC GRAVITY, URINE: 1.01 (ref 1.005–1.030)
UROBILINOGEN UA: 0.2 mg/dL (ref 0.0–1.0)

## 2014-12-10 ENCOUNTER — Encounter (HOSPITAL_COMMUNITY): Payer: Self-pay | Admitting: Emergency Medicine

## 2014-12-10 ENCOUNTER — Emergency Department (HOSPITAL_COMMUNITY): Payer: Medicare Other

## 2014-12-10 ENCOUNTER — Inpatient Hospital Stay (HOSPITAL_COMMUNITY)
Admission: EM | Admit: 2014-12-10 | Discharge: 2014-12-11 | DRG: 871 | Disposition: A | Discharge status: Home Health | Payer: Medicare Other | Attending: Internal Medicine | Admitting: Internal Medicine

## 2014-12-10 DIAGNOSIS — N390 Urinary tract infection, site not specified: Secondary | ICD-10-CM | POA: Diagnosis present

## 2014-12-10 DIAGNOSIS — R339 Retention of urine, unspecified: Secondary | ICD-10-CM | POA: Diagnosis present

## 2014-12-10 DIAGNOSIS — I129 Hypertensive chronic kidney disease with stage 1 through stage 4 chronic kidney disease, or unspecified chronic kidney disease: Secondary | ICD-10-CM | POA: Diagnosis present

## 2014-12-10 DIAGNOSIS — Z1621 Resistance to vancomycin: Secondary | ICD-10-CM | POA: Diagnosis present

## 2014-12-10 DIAGNOSIS — I480 Paroxysmal atrial fibrillation: Secondary | ICD-10-CM | POA: Diagnosis present

## 2014-12-10 DIAGNOSIS — R4182 Altered mental status, unspecified: Secondary | ICD-10-CM | POA: Diagnosis not present

## 2014-12-10 DIAGNOSIS — Z87891 Personal history of nicotine dependence: Secondary | ICD-10-CM

## 2014-12-10 DIAGNOSIS — E039 Hypothyroidism, unspecified: Secondary | ICD-10-CM | POA: Diagnosis present

## 2014-12-10 DIAGNOSIS — A4181 Sepsis due to Enterococcus: Principal | ICD-10-CM | POA: Diagnosis present

## 2014-12-10 DIAGNOSIS — I509 Heart failure, unspecified: Secondary | ICD-10-CM | POA: Diagnosis present

## 2014-12-10 DIAGNOSIS — I2510 Atherosclerotic heart disease of native coronary artery without angina pectoris: Secondary | ICD-10-CM | POA: Diagnosis present

## 2014-12-10 DIAGNOSIS — I701 Atherosclerosis of renal artery: Secondary | ICD-10-CM | POA: Diagnosis present

## 2014-12-10 DIAGNOSIS — I959 Hypotension, unspecified: Secondary | ICD-10-CM

## 2014-12-10 DIAGNOSIS — I716 Thoracoabdominal aortic aneurysm, without rupture: Secondary | ICD-10-CM | POA: Diagnosis present

## 2014-12-10 DIAGNOSIS — F0390 Unspecified dementia without behavioral disturbance: Secondary | ICD-10-CM | POA: Diagnosis present

## 2014-12-10 DIAGNOSIS — Z79899 Other long term (current) drug therapy: Secondary | ICD-10-CM | POA: Diagnosis not present

## 2014-12-10 DIAGNOSIS — N183 Chronic kidney disease, stage 3 unspecified: Secondary | ICD-10-CM | POA: Diagnosis present

## 2014-12-10 DIAGNOSIS — Z7902 Long term (current) use of antithrombotics/antiplatelets: Secondary | ICD-10-CM

## 2014-12-10 DIAGNOSIS — R410 Disorientation, unspecified: Secondary | ICD-10-CM | POA: Diagnosis not present

## 2014-12-10 DIAGNOSIS — N189 Chronic kidney disease, unspecified: Secondary | ICD-10-CM

## 2014-12-10 DIAGNOSIS — G9340 Encephalopathy, unspecified: Secondary | ICD-10-CM | POA: Diagnosis not present

## 2014-12-10 DIAGNOSIS — I10 Essential (primary) hypertension: Secondary | ICD-10-CM | POA: Diagnosis present

## 2014-12-10 DIAGNOSIS — Z8673 Personal history of transient ischemic attack (TIA), and cerebral infarction without residual deficits: Secondary | ICD-10-CM

## 2014-12-10 HISTORY — DX: Sepsis due to Enterococcus: A41.81

## 2014-12-10 LAB — URINE CULTURE: Colony Count: >=100000

## 2014-12-10 LAB — CBC
HCT: 37 % (ref 36.0–46.0)
HEMOGLOBIN: 12.1 g/dL (ref 12.0–15.0)
MCH: 30.4 pg (ref 26.0–34.0)
MCHC: 32.7 g/dL (ref 30.0–36.0)
MCV: 93 fL (ref 78.0–100.0)
Platelets: 160 10*3/uL (ref 150–400)
RBC: 3.98 MIL/uL (ref 3.87–5.11)
RDW: 14.9 % (ref 11.5–15.5)
WBC: 4.8 10*3/uL (ref 4.0–10.5)

## 2014-12-10 LAB — BRAIN NATRIURETIC PEPTIDE: B Natriuretic Peptide: 95 pg/mL (ref 0.0–100.0)

## 2014-12-10 LAB — URINALYSIS, ROUTINE W REFLEX MICROSCOPIC
Bilirubin Urine: NEGATIVE
GLUCOSE, UA: NEGATIVE mg/dL
KETONES UR: NEGATIVE mg/dL
Nitrite: NEGATIVE
PROTEIN: NEGATIVE mg/dL
Specific Gravity, Urine: <1.005 — ABNORMAL LOW (ref 1.005–1.030)
Urobilinogen, UA: 0.2 mg/dL (ref 0.0–1.0)
pH: 6.5 (ref 5.0–8.0)

## 2014-12-10 LAB — LACTIC ACID, PLASMA: Lactic Acid, Venous: 1.1 mmol/L (ref 0.5–2.0)

## 2014-12-10 LAB — COMPREHENSIVE METABOLIC PANEL
ALT: 8 U/L — ABNORMAL LOW (ref 14–54)
AST: 28 U/L (ref 15–41)
Albumin: 3.3 g/dL — ABNORMAL LOW (ref 3.5–5.0)
Alkaline Phosphatase: 39 U/L (ref 38–126)
Anion gap: 10 (ref 5–15)
BUN: 14 mg/dL (ref 6–20)
CO2: 30 mmol/L (ref 22–32)
Calcium: 8.5 mg/dL — ABNORMAL LOW (ref 8.9–10.3)
Chloride: 90 mmol/L — ABNORMAL LOW (ref 101–111)
Creatinine, Ser: 1.96 mg/dL — ABNORMAL HIGH (ref 0.44–1.00)
GFR calc Af Amer: 27 mL/min — ABNORMAL LOW (ref >60)
GFR, EST NON AFRICAN AMERICAN: 23 mL/min — AB (ref >60)
Glucose, Bld: 90 mg/dL (ref 65–99)
Potassium: 3.6 mmol/L (ref 3.5–5.1)
Sodium: 130 mmol/L — ABNORMAL LOW (ref 135–145)
Total Bilirubin: 0.8 mg/dL (ref 0.3–1.2)
Total Protein: 6.6 g/dL (ref 6.5–8.1)

## 2014-12-10 LAB — URINE MICROSCOPIC-ADD ON

## 2014-12-10 LAB — PROCALCITONIN: Procalcitonin: <0.1 ng/mL

## 2014-12-10 MED ORDER — SODIUM CHLORIDE 0.9 % IV SOLN: INTRAVENOUS | Status: DC

## 2014-12-10 MED ORDER — SODIUM CHLORIDE 0.9 % IV BOLUS (SEPSIS)
1000.0000 mL | Freq: Once | INTRAVENOUS | Status: AC
  Administered 2014-12-10: 1000 mL via INTRAVENOUS

## 2014-12-10 MED ORDER — IPRATROPIUM-ALBUTEROL 0.5-2.5 (3) MG/3ML IN SOLN
3.0000 mL | Freq: Four times a day (QID) | RESPIRATORY_TRACT | Status: DC
  Administered 2014-12-10: 3 mL via RESPIRATORY_TRACT
  Filled 2014-12-10: qty 3

## 2014-12-10 MED ORDER — SODIUM CHLORIDE 0.9 % IV SOLN
INTRAVENOUS | Status: DC
  Administered 2014-12-10: 05:00:00 via INTRAVENOUS

## 2014-12-10 MED ORDER — SODIUM CHLORIDE 0.9 % IV SOLN
INTRAVENOUS | Status: DC
  Administered 2014-12-11: 06:00:00 via INTRAVENOUS

## 2014-12-10 MED ORDER — SODIUM CHLORIDE 0.9 % IJ SOLN: 3.0000 mL | Freq: Two times a day (BID) | INTRAMUSCULAR | Status: DC

## 2014-12-10 MED ORDER — CLOPIDOGREL BISULFATE 75 MG PO TABS
75.0000 mg | ORAL_TABLET | Freq: Every day | ORAL | Status: DC
  Administered 2014-12-10: 75 mg via ORAL
  Filled 2014-12-10: qty 1

## 2014-12-10 MED ORDER — ENOXAPARIN SODIUM 30 MG/0.3ML ~~LOC~~ SOLN
30.0000 mg | SUBCUTANEOUS | Status: DC
  Administered 2014-12-10: 30 mg via SUBCUTANEOUS
  Filled 2014-12-10: qty 0.3

## 2014-12-10 MED ORDER — IPRATROPIUM-ALBUTEROL 0.5-2.5 (3) MG/3ML IN SOLN: 3.0000 mL | Freq: Four times a day (QID) | RESPIRATORY_TRACT | Status: DC | PRN

## 2014-12-10 MED ORDER — ATORVASTATIN CALCIUM 40 MG PO TABS
40.0000 mg | ORAL_TABLET | Freq: Every day | ORAL | Status: DC
  Administered 2014-12-10: 40 mg via ORAL
  Filled 2014-12-10: qty 1

## 2014-12-10 MED ORDER — TRAZODONE HCL 50 MG PO TABS
50.0000 mg | ORAL_TABLET | Freq: Every day | ORAL | Status: DC
  Administered 2014-12-10: 50 mg via ORAL
  Filled 2014-12-10: qty 1

## 2014-12-10 MED ORDER — ENOXAPARIN SODIUM 40 MG/0.4ML ~~LOC~~ SOLN: 40.0000 mg | SUBCUTANEOUS | Status: DC

## 2014-12-10 MED ORDER — DEXTROSE 5 % IV SOLN: 1.0000 g | Freq: Once | INTRAVENOUS | Status: DC

## 2014-12-10 MED ORDER — LINEZOLID 600 MG/300ML IV SOLN
600.0000 mg | Freq: Two times a day (BID) | INTRAVENOUS | Status: DC
  Administered 2014-12-10 – 2014-12-11 (×3): 600 mg via INTRAVENOUS
  Filled 2014-12-10 (×3): qty 300

## 2014-12-10 NOTE — Progress Notes (Signed)
CRITICAL VALUE ALERT  Critical value received: VRE in urine  Date of notification:  12/10/14  Time of notification:  0825  Critical value read back:Yes  Nurse who received alert: Joelyn Oms  MD notified (1st page): Dyanne Carrel, NP  Time of first page: [phone]

## 2014-12-10 NOTE — Progress Notes (Signed)
UR chart review completed.  

## 2014-12-10 NOTE — ED Provider Notes (Signed)
CSN: [id number]     Arrival date & time 12/10/14  0005 History   First MD Initiated Contact with Patient 12/10/14 0109     Chief Complaint  Patient presents with  . Altered Mental Status   Level V caveat for dementia  (Consider location/radiation/quality/duration/timing/severity/associated sxs/prior Treatment) HPI patient presents via EMS with no family. She states she does not know why she's here. She states "I guess I was sick". She denies any pain. She denies nausea, vomiting, diarrhea. Per EMS her family states she wasn't acting like herself and she been recently treated for a UTI. They did not tell them what antibiotic she was treated with. Patient presents from EMS with a blood pressure 72/63.  PCP Dr Hilma Favors  Past Medical History  Diagnosis Date  . Stroke   . CAD (coronary artery disease)   . Hypertension   . Bite from cat 03/17/2011  . CHF (congestive heart failure)   . PAF (paroxysmal atrial fibrillation)   . Hypothyroidism   . Dementia   . CKD (chronic kidney disease) stage 3, GFR 30-59 ml/min 09/28/2014  . Thoracoabdominal aortic aneurysm 09/29/2014    6.4 x 6.0 cm; previously 4.2 cm.  . Traumatic hematoma of buttock 09/28/2014   Past Surgical History  Procedure Laterality Date  . Stents       kidneys and 2 in heart  . Abdominal hysterectomy     Family History  Problem Relation Age of Onset  . Cancer Mother   . Cancer Sister   . COPD Sister   . Heart failure Sister    History  Substance Use Topics  . Smoking status: Former Smoker -- 1.00 packs/day for 50 years    Types: Cigarettes    Quit date: 04/03/2001  . Smokeless tobacco: Never Used  . Alcohol Use: No    Patient told me she lived alone however should she then told me she lived with her daughter  OB History    83 Para Term Preterm AB TAB SAB Ectopic Multiple Living   7 7 7       5      Review of Systems  Unable to perform ROS: Dementia      Allergies  Review of patient's allergies indicates  no known allergies.  Home Medications   Prior to Admission medications   Medication Sig Start Date End Date Taking? Authorizing Provider  atorvastatin (LIPITOR) 40 MG tablet Take 40 mg by mouth at bedtime.    Historical Provider, MD  bisacodyl (DULCOLAX) 5 MG EC tablet Take 10 mg by mouth daily as needed for mild constipation or moderate constipation.    Historical Provider, MD  clopidogrel (PLAVIX) 75 MG tablet Take 1 tablet (75 mg total) by mouth at bedtime. Resume in 1 week if hemoglobin stable 10/03/14   Kathie Dike, MD  diltiazem (CARDIZEM) 30 MG tablet Take 1 tablet (30 mg total) by mouth every 8 (eight) hours. 10/03/14   Kathie Dike, MD  ferrous sulfate 325 (65 FE) MG tablet Take 325 mg by mouth daily with breakfast.    Historical Provider, MD  furosemide (LASIX) 20 MG tablet Take 1 tablet (20 mg total) by mouth daily. 10/03/14   Kathie Dike, MD  guaiFENesin (MUCINEX) 600 MG 12 hr tablet Take 1 tablet (600 mg total) by mouth 2 (two) times daily. 10/03/14   Kathie Dike, MD  ipratropium-albuterol (DUONEB) 0.5-2.5 (3) MG/3ML SOLN Take 3 mLs by nebulization every 6 (six) hours. 10/03/14   Kathie Dike, MD  potassium chloride (KLOR-CON) 20 MEQ packet Take by mouth 2 (two) times daily.    Historical Provider, MD  senna-docusate (SENOKOT-S) 8.6-50 MG per tablet Take 1 tablet by mouth daily.    Historical Provider, MD  traZODone (DESYREL) 50 MG tablet Take 50 mg by mouth at bedtime.      Historical Provider, MD   BP 101/76 mmHg  Pulse 80  Temp(Src) 98.2 F (36.8 C)  Resp 18  Wt 180 lb (81.647 kg)  SpO2 99%  Vital signs normal   Physical Exam  Constitutional: She appears well-developed and well-nourished.  Non-toxic appearance. She does not appear ill. No distress.  HENT:  Head: Normocephalic and atraumatic.  Right Ear: External ear normal.  Left Ear: External ear normal.  Nose: Nose normal. No mucosal edema or rhinorrhea.  Mouth/Throat: Oropharynx is clear and moist and mucous  membranes are normal. No dental abscesses or uvula swelling.  Eyes: Conjunctivae and EOM are normal. Pupils are equal, round, and reactive to light.  Neck: Normal range of motion and full passive range of motion without pain. Neck supple.  Cardiovascular: Normal rate, regular rhythm and normal heart sounds.  Exam reveals no gallop and no friction rub.   No murmur heard. Pulmonary/Chest: Effort normal and breath sounds normal. No respiratory distress. She has no wheezes. She has no rhonchi. She has no rales. She exhibits no tenderness and no crepitus.  Abdominal: Soft. Normal appearance and bowel sounds are normal. She exhibits no distension. There is no tenderness. There is no rebound and no guarding.  Musculoskeletal: Normal range of motion. She exhibits no edema or tenderness.  Moves all extremities well.   Neurological: She is alert. She has normal strength. No cranial nerve deficit.  Patient know she's in the hospital  Skin: Skin is warm, dry and intact. No rash noted. No erythema. No pallor.  Psychiatric: She has a normal mood and affect. Her speech is normal and behavior is normal. Her mood appears not anxious.  Nursing note and vitals reviewed.   ED Course  Procedures (including critical care time)  Medications  cefTRIAXone (ROCEPHIN) 1 g in dextrose 5 % 50 mL IVPB (not administered)  sodium chloride 0.9 % bolus 1,000 mL (0 mLs Intravenous Stopped 12/10/14 0145)  sodium chloride 0.9 % bolus 1,000 mL (0 mLs Intravenous Stopped 12/10/14 0241)    Patient was given IV fluid bolus for her hypotension. Her blood pressure improved rapidly.  Patient had to have it out cath done to get urinalysis. It appears she still has a UTI. Urine culture was sent although she is supposedly on antibiotics. She was given IV Rocephin for her UTI. Due to her hypotension which is possibly due to poor dietary intake since she does not have a fever or elevated lactic acid she was discussed with the hospitalist  for admission.   04:03 Dr Shanon Brow, admit to tele, triad team. Wants patient to be given Zyvox.     4d ago    Specimen Description URINE, RANDOM   Special Requests NONE   Colony Count >=100,000 COLONIES/ML  Performed at [facility]       Culture ENTEROCOCCUS SPECIES  Note: VANCOMYCIN TO FOLLOW  Performed at [facility]       Report Status PENDING   Organism ID, Bacteria ENTEROCOCCUS SPECIES   Resulting Agency [facility]    Culture & Susceptibility      ENTEROCOCCUS SPECIES     Antibiotic Sensitivity Microscan Status    AMPICILLIN Resistant >=  32 RESISTANT Final    Method: MIC    LEVOFLOXACIN Resistant >=8 RESISTANT Final    Method: MIC    LINEZOLID Sensitive 2 SENSITIVE Final    Method: MIC    NITROFURANTOIN Resistant 128 RESISTANT Final    Method: MIC    SYNERCID. Sensitive 1 SENSITIVE Final    Method: MIC    TETRACYCLINE Sensitive <=1 SENSITIVE Final    Method: MIC    Comments ENTEROCOCCUS SPECIES (MIC)    ENTEROCOCCUS SPECIES               Specimen Collected: 12/06/14 2:35 PM Last Resulted: 12/09/14 9:15 AM                     Labs Review Results for orders placed or performed during the hospital encounter of 12/10/14  Comprehensive metabolic panel  Result Value Ref Range   Sodium 130 (L) 135 - 145 mmol/L   Potassium 3.6 3.5 - 5.1 mmol/L   Chloride 90 (L) 101 - 111 mmol/L   CO2 30 22 - 32 mmol/L   Glucose, Bld 90 65 - 99 mg/dL   BUN 14 6 - 20 mg/dL   Creatinine, Ser 1.96 (H) 0.44 - 1.00 mg/dL   Calcium 8.5 (L) 8.9 - 10.3 mg/dL   Total Protein 6.6 6.5 - 8.1 g/dL   Albumin 3.3 (L) 3.5 - 5.0 g/dL   AST 28 15 - 41 U/L   ALT 8 (L) 14 - 54 U/L   Alkaline Phosphatase 39 38 - 126 U/L   Total Bilirubin 0.8 0.3 - 1.2 mg/dL   GFR calc non Af Amer 23 (L) >60 mL/min   GFR calc Af Amer 27 (L) >60 mL/min   Anion gap 10 5 - 15  Urinalysis, Routine w reflex microscopic (not at [facility])  Result Value  Ref Range   Color, Urine STRAW (A) YELLOW   APPearance CLEAR CLEAR   Specific Gravity, Urine <1.005 (L) 1.005 - 1.030   pH 6.5 5.0 - 8.0   Glucose, UA NEGATIVE NEGATIVE mg/dL   Hgb urine dipstick SMALL (A) NEGATIVE   Bilirubin Urine NEGATIVE NEGATIVE   Ketones, ur NEGATIVE NEGATIVE mg/dL   Protein, ur NEGATIVE NEGATIVE mg/dL   Urobilinogen, UA 0.2 0.0 - 1.0 mg/dL   Nitrite NEGATIVE NEGATIVE   Leukocytes, UA LARGE (A) NEGATIVE  CBC  Result Value Ref Range   WBC 4.8 4.0 - 10.5 K/uL   RBC 3.98 3.87 - 5.11 MIL/uL   Hemoglobin 12.1 12.0 - 15.0 g/dL   HCT 37.0 36.0 - 46.0 %   MCV 93.0 78.0 - 100.0 fL   MCH 30.4 26.0 - 34.0 pg   MCHC 32.7 30.0 - 36.0 g/dL   RDW 14.9 11.5 - 15.5 %   Platelets 160 150 - 400 K/uL  Lactic acid, plasma  Result Value Ref Range   Lactic Acid, Venous 1.1 0.5 - 2.0 mmol/L  Brain natriuretic peptide  Result Value Ref Range   B Natriuretic Peptide 95.0 0.0 - 100.0 pg/mL  Urine microscopic-add on  Result Value Ref Range   Squamous Epithelial / LPF MANY (A) RARE   WBC, UA TOO NUMEROUS TO COUNT <3 WBC/hpf   RBC / HPF 0-2 <3 RBC/hpf   Bacteria, UA MANY (A) RARE   Crystals CA OXALATE CRYSTALS (A) NEGATIVE   Urine-Other MANY YEAST    Laboratory interpretation all normal except persistent UTI, stable renal insufficiency   Imaging Review Dg Chest [facility] 1 [address]  12/10/2014   CLINICAL DATA:  Hypotension and altered mental status tonight. Under treatment for urinary tract infection.  EXAM: PORTABLE CHEST - 1 VIEW  COMPARISON:  10/02/2014  FINDINGS: Dilated and tortuous thoracic aorta. Normal heart size and pulmonary vascularity. Lungs are clear and expanded. No blunting of costophrenic angles. No pneumothorax. Similar appearance to previous study.  IMPRESSION: Tortuous and dilated thoracic aorta. Lungs clear. No change since prior study.   Electronically Signed   By: Lucienne Capers M.D.   On: 12/10/2014 02:24     EKG Interpretation   Date/Time:  Tuesday December 10 2014 00:31:11 EDT Ventricular Rate:  96 PR Interval:  152 QRS Duration: 155 QT Interval:  445 QTC Calculation: 562 R Axis:   88 Text Interpretation:  Atrial fibrillation Right bundle branch block  Inferior infarct , old Since last tracing rate slower (28 Sep 2014)  Confirmed by Shavaun Osterloh  MD-I, Dede Dobesh ([id number]) on 12/10/2014 1:26:28 AM      MDM   Final diagnoses:  Hypotension, unspecified hypotension type  Urinary tract infection without hematuria, site unspecified  Altered mental status, unspecified altered mental status type  Chronic renal insufficiency, unspecified stage    Plan admission   Rolland Porter, MD, FACEP  CRITICAL CARE Performed by: Rolland Porter L Total critical care time: 35 min Critical care time was exclusive of separately billable procedures and treating other patients. Critical care was necessary to treat or prevent imminent or life-threatening deterioration. Critical care was time spent personally by me on the following activities: development of treatment plan with patient and/or surrogate as well as nursing, discussions with consultants, evaluation of patient's response to treatment, examination of patient, obtaining history from patient or surrogate, ordering and performing treatments and interventions, ordering and review of laboratory studies, ordering and review of radiographic studies, pulse oximetry and re-evaluation of patient's condition.      Rolland Porter, MD 12/10/14 [phone]

## 2014-12-10 NOTE — ED Notes (Signed)
Per family pt has not been acting like herself and is being treated uti.

## 2014-12-10 NOTE — ED Notes (Signed)
Pt admited to room 2 with bp 72/63. PIV placed and NS bolus started per protocol. Labs drawn. bp up to 94/74. EKG completed and monitor in place.

## 2014-12-10 NOTE — H&P (Signed)
PCP:   [name], MD   Chief Complaint:  confusion  HPI: 81 yo female h/o dementia, htn, paf, chf, cva sent in by family from home via EMS for "not acting right".  History obtained from ed staff, as pt is demented and family has not arrived.  Reported that she is on some antibiotic for a uti.  Today she was more confused than usual and "not acting right".  On arrival to ED her sbp was in the 83s.  After review of her record, she did have recent urine cx from 6/10 growing mdr enterococcus.  She has received 2 liters ivf boluses and her bp is much improved.  We are asked to admit her for her infection.  Pt denies any pain anywhere.  Pleasantly does not know what is going on and why she is here.  Review of Systems:  unobtainalbe from pt due to her dementia   Past Medical History: Past Medical History  Diagnosis Date  . Stroke   . CAD (coronary artery disease)   . Hypertension   . Bite from cat 03/17/2011  . CHF (congestive heart failure)   . PAF (paroxysmal atrial fibrillation)   . Hypothyroidism   . Dementia   . CKD (chronic kidney disease) stage 3, GFR 30-59 ml/min 09/28/2014  . Thoracoabdominal aortic aneurysm 09/29/2014    6.4 x 6.0 cm; previously 4.2 cm.  . Traumatic hematoma of buttock 09/28/2014   Past Surgical History  Procedure Laterality Date  . Stents       kidneys and 2 in heart  . Abdominal hysterectomy      Medications: Prior to Admission medications   Medication Sig Start Date End Date Taking? Authorizing Provider  atorvastatin (LIPITOR) 40 MG tablet Take 40 mg by mouth at bedtime.    Historical Provider, MD  bisacodyl (DULCOLAX) 5 MG EC tablet Take 10 mg by mouth daily as needed for mild constipation or moderate constipation.    Historical Provider, MD  clopidogrel (PLAVIX) 75 MG tablet Take 1 tablet (75 mg total) by mouth at bedtime. Resume in 1 week if hemoglobin stable 10/03/14   Kathie Dike, MD  diltiazem (CARDIZEM) 30 MG tablet Take 1 tablet (30 mg  total) by mouth every 8 (eight) hours. 10/03/14   Kathie Dike, MD  ferrous sulfate 325 (65 FE) MG tablet Take 325 mg by mouth daily with breakfast.    Historical Provider, MD  furosemide (LASIX) 20 MG tablet Take 1 tablet (20 mg total) by mouth daily. 10/03/14   Kathie Dike, MD  guaiFENesin (MUCINEX) 600 MG 12 hr tablet Take 1 tablet (600 mg total) by mouth 2 (two) times daily. 10/03/14   Kathie Dike, MD  ipratropium-albuterol (DUONEB) 0.5-2.5 (3) MG/3ML SOLN Take 3 mLs by nebulization every 6 (six) hours. 10/03/14   Kathie Dike, MD  potassium chloride (KLOR-CON) 20 MEQ packet Take by mouth 2 (two) times daily.    Historical Provider, MD  senna-docusate (SENOKOT-S) 8.6-50 MG per tablet Take 1 tablet by mouth daily.    Historical Provider, MD  traZODone (DESYREL) 50 MG tablet Take 50 mg by mouth at bedtime.      Historical Provider, MD    Allergies:  No Known Allergies  Social History:  reports that she quit smoking about 13 years ago. Her smoking use included Cigarettes. She has a 50 pack-year smoking history. She has never used smokeless tobacco. She reports that she does not drink alcohol or use illicit drugs.  Family History:  Family History  Problem Relation Age of Onset  . Cancer Mother   . Cancer Sister   . COPD Sister   . Heart failure Sister     Physical Exam: Filed Vitals:   12/10/14 0045 12/10/14 0100 12/10/14 0115 12/10/14 0122  BP: 105/78 100/76 101/76   Pulse: 74 79 80   Temp:    98.2 F (36.8 C)  Resp: 21 16 18    Weight:      SpO2: 100% 98% 99%    General appearance: alert, cooperative and no distress Head: Normocephalic, without obvious abnormality, atraumatic Eyes: negative Nose: Nares normal. Septum midline. Mucosa normal. No drainage or sinus tenderness. Neck: no JVD and supple, symmetrical, trachea midline Lungs: clear to auscultation bilaterally Heart: regular rate and rhythm, S1, S2 normal, no murmur, click, rub or gallop Abdomen: soft, non-tender;  bowel sounds normal; no masses,  no organomegaly Extremities: extremities normal, atraumatic, no cyanosis or edema Pulses: 2+ and symmetric Skin: Skin color, texture, turgor normal. No rashes or lesions Neurologic: Grossly normal pleasantly confused and demented oriented to person only   Labs on Admission:   Recent Labs  12/10/14 0039  NA 130*  K 3.6  CL 90*  CO2 30  GLUCOSE 90  BUN 14  CREATININE 1.96*  CALCIUM 8.5*    Recent Labs  12/10/14 0039  AST 28  ALT 8*  ALKPHOS 39  BILITOT 0.8  PROT 6.6  ALBUMIN 3.3*    Recent Labs  12/10/14 0039  WBC 4.8  HGB 12.1  HCT 37.0  MCV 93.0  PLT 160    Radiological Exams on Admission: Dg Chest Port 1 View  12/10/2014   CLINICAL DATA:  Hypotension and altered mental status tonight. Under treatment for urinary tract infection.  EXAM: PORTABLE CHEST - 1 VIEW  COMPARISON:  10/02/2014  FINDINGS: Dilated and tortuous thoracic aorta. Normal heart size and pulmonary vascularity. Lungs are clear and expanded. No blunting of costophrenic angles. No pneumothorax. Similar appearance to previous study.  IMPRESSION: Tortuous and dilated thoracic aorta. Lungs clear. No change since prior study.   Electronically Signed   By: Lucienne Capers M.D.   On: 12/10/2014 02:24   Old records reviewed Case discussed with edp dr Daleen Bo knapp ekg reviewed afib rbbb old cxr reviewed no edema or infiltrate  Assessment/Plan  81 yo female with sepsis from enterococcus UTI  Principal Problem:   Sepsis due to probable enterococcus-  bp has stabilized over the last 3 hours in the ED after 2 liters of ivf.  Admit to tele bed.  Check procalcitonin level.  Place on zyvox which is quided by recent culture data from 12/06/14.  Urine has been recultured, doubt she was on appropriate oral abx at home.  Surprisingly her lactic acid level is nml and her renal function is at her baseline so probably early sepsis.  Obtain blood cultures.    Active Problems:   Recurrent  UTI- as above   Encephalopathy acute- likely due to infection plus hypotension, obtain freq neuro checks overnight.  No focal neuro deficits.  No further w/u warranted at this time.  Will also check orthostatics on floor.   History of CVA (cerebrovascular accident)-  Noted, cont plavix.   Renal artery stenosis, left-  noted   PAF (paroxysmal atrial fibrillation)-  Rate controlled.  Her CCB is put on hold due to above issues, would address restarting this within the next 12 hours or so if bp remains stable.  Not chronically anticoagulated for  unclear reasons.     CKD (chronic kidney disease) stage 3, GFR 30-59 ml/min-  At baseline cr of 2   Dementia- noted   H/o CHF per chart-  Lasix also on hold, last echo over 4 years ago with nml EF.  Admit to tele bed.  Presumptive FULL CODE.  Lawayne Hartig A 12/10/2014, 4:11 AM

## 2014-12-10 NOTE — Progress Notes (Signed)
[facility] PROGRESS NOTE  Brandy Mcguire:[id number] DOB: 13-Aug-1933 DOA: 12/10/2014 PCP: [name], MD  Assessment/Plan:  Sepsis due to probable enterococcus UTI- hx of same. Much improved this am. Hemodynamically stable. More alert. Procalcitonin level <0.10. Continue zyvox which. Await repeat urine culture. Concern that  she was not on appropriate oral abx at home.Await blood cultures.she is afebrile and non-toxic appearing   Active Problems:  Recurrent UTI- as above   Encephalopathy acute- likely due to infection plus hypotension. Appears to be at baseline this am.  No focal neuro deficits. No further w/u warranted at this time.    History of CVA (cerebrovascular accident)- Noted, cont plavix.   Renal artery stenosis, left- noted   PAF (paroxysmal atrial fibrillation)- Rate controlled. Her CCB on hold due to above issues. Monitor and resume as indicated.  Not chronically anticoagulated for unclear reasons.    CKD (chronic kidney disease) stage 3, GFR 30-59 ml/min- At baseline cr of 2. Urine output good   Dementia- noted   H/o CHF per chart- Lasix also on hold, last echo over 4 years ago with nml EF.  Urinary retention: hx of same. Chronic foley. OP urology follow up.    Code Status: full Family Communication: none present Disposition Plan: home when ready   Consultants:  none  Procedures:  none  Antibiotics:  HPI/Subjective: Awake. Denies pain/discomfort. Reports being "sleepy"  Objective: Filed Vitals:   12/10/14 0751  BP:   Pulse: 70  Temp:   Resp: 18    Intake/Output Summary (Last 24 hours) at 12/10/14 1026 Last data filed at 12/10/14 0900  Gross per 24 hour  Intake      0 ml  Output      0 ml  Net      0 ml   Filed Weights   12/10/14 0008 12/10/14 0458  Weight: 81.647 kg (180 lb) 75.615 kg (166 lb 11.2 oz)    Exam:   General:  Well nourished. Appears comfortable  Cardiovascular: RRR no MGR No  LE edema  Respiratory: normal effort BS clear bilaterally no wheeze  Abdomen: non-distended +BS non-tener  Musculoskeletal: no clubbing or cyanosis  Neuro: follows commands, alert, oriented to self only   Data Reviewed: Basic Metabolic Panel:  Recent Labs Lab 12/10/14 0039  NA 130*  K 3.6  CL 90*  CO2 30  GLUCOSE 90  BUN 14  CREATININE 1.96*  CALCIUM 8.5*   Liver Function Tests:  Recent Labs Lab 12/10/14 0039  AST 28  ALT 8*  ALKPHOS 39  BILITOT 0.8  PROT 6.6  ALBUMIN 3.3*   No results for input(s): LIPASE, AMYLASE in the last 168 hours. No results for input(s): AMMONIA in the last 168 hours. CBC:  Recent Labs Lab 12/10/14 0039  WBC 4.8  HGB 12.1  HCT 37.0  MCV 93.0  PLT 160   Cardiac Enzymes: No results for input(s): CKTOTAL, CKMB, CKMBINDEX, TROPONINI in the last 168 hours. BNP (last 3 results)  Recent Labs  12/10/14 0039  BNP 95.0    ProBNP (last 3 results) No results for input(s): PROBNP in the last 8760 hours.  CBG: No results for input(s): GLUCAP in the last 168 hours.  Recent Results (from the past 240 hour(s))  Urine culture     Status: None   Collection Time: 12/06/14  2:35 PM  Result Value Ref Range Status   Specimen Description URINE, RANDOM  Final   Special Requests NONE  Final   Colony  Count   Final    >=100,000 COLONIES/ML Performed at [facility]    Culture   Final    VANCOMYCIN RESISTANT ENTEROCOCCUS ISOLATED Note: CRITICAL RESULT CALLED TO, READ BACK BY AND VERIFIED WITH: LISA 6/14 AT 0825 BY [facility] Performed at [facility]    Report Status 12/10/2014 FINAL  Final   Organism ID, Bacteria VANCOMYCIN RESISTANT ENTEROCOCCUS ISOLATED  Final      Susceptibility   Vancomycin resistant enterococcus isolated - MIC*    AMPICILLIN >=32 RESISTANT Resistant     LEVOFLOXACIN >=8 RESISTANT Resistant     NITROFURANTOIN 128 RESISTANT Resistant     TETRACYCLINE <=1 SENSITIVE Sensitive     LINEZOLID 2 SENSITIVE  Sensitive     SYNERCID. 1 SENSITIVE Sensitive     VANCOMYCIN 256 RESISTANT Resistant     * VANCOMYCIN RESISTANT ENTEROCOCCUS ISOLATED  Culture, blood (routine x 2)     Status: None (Preliminary result)   Collection Time: 12/10/14  4:50 AM  Result Value Ref Range Status   Specimen Description BLOOD  Final   Special Requests NONE  Final   Culture NO GROWTH <24 HRS  Final   Report Status PENDING  Incomplete  Culture, blood (routine x 2)     Status: None (Preliminary result)   Collection Time: 12/10/14  5:05 AM  Result Value Ref Range Status   Specimen Description BLOOD  Final   Special Requests NONE  Final   Culture NO GROWTH <24 HRS  Final   Report Status PENDING  Incomplete     Studies: Dg Chest Port 1 View  12/10/2014   CLINICAL DATA:  Hypotension and altered mental status tonight. Under treatment for urinary tract infection.  EXAM: PORTABLE CHEST - 1 VIEW  COMPARISON:  10/02/2014  FINDINGS: Dilated and tortuous thoracic aorta. Normal heart size and pulmonary vascularity. Lungs are clear and expanded. No blunting of costophrenic angles. No pneumothorax. Similar appearance to previous study.  IMPRESSION: Tortuous and dilated thoracic aorta. Lungs clear. No change since prior study.   Electronically Signed   By: Lucienne Capers M.D.   On: 12/10/2014 02:24    Scheduled Meds: . sodium chloride   Intravenous STAT  . atorvastatin  40 mg Oral QHS  . clopidogrel  75 mg Oral QHS  . enoxaparin (LOVENOX) injection  30 mg Subcutaneous Q24H  . linezolid  600 mg Intravenous Q12H  . sodium chloride  3 mL Intravenous Q12H  . traZODone  50 mg Oral QHS   Continuous Infusions: . sodium chloride 100 mL/hr at 12/10/14 1610    Principal Problem:   Sepsis due to enterococcus Active Problems:   HTN (hypertension)   History of CVA (cerebrovascular accident)   Renal artery stenosis, left   Recurrent UTI   PAF (paroxysmal atrial fibrillation)   CKD (chronic kidney disease) stage 3, GFR 30-59  ml/min   Urinary retention   Encephalopathy acute   Dementia    Time spent: 30 minutes    [facility] Pager [phone]. If 7PM-7AM, please contact night-coverage at [email or web address], password [facility] 12/10/2014, 10:26 AM  LOS: 0 days

## 2014-12-11 DIAGNOSIS — G9340 Encephalopathy, unspecified: Secondary | ICD-10-CM

## 2014-12-11 DIAGNOSIS — A4181 Sepsis due to Enterococcus: Principal | ICD-10-CM

## 2014-12-11 LAB — BASIC METABOLIC PANEL
ANION GAP: 7 (ref 5–15)
BUN: 11 mg/dL (ref 6–20)
CO2: 27 mmol/L (ref 22–32)
CREATININE: 1.44 mg/dL — AB (ref 0.44–1.00)
Calcium: 8.1 mg/dL — ABNORMAL LOW (ref 8.9–10.3)
Chloride: 98 mmol/L — ABNORMAL LOW (ref 101–111)
GFR calc Af Amer: 39 mL/min — ABNORMAL LOW (ref >60)
GFR calc non Af Amer: 34 mL/min — ABNORMAL LOW (ref >60)
Glucose, Bld: 77 mg/dL (ref 65–99)
POTASSIUM: 3.6 mmol/L (ref 3.5–5.1)
Sodium: 132 mmol/L — ABNORMAL LOW (ref 135–145)

## 2014-12-11 MED ORDER — LINEZOLID 600 MG PO TABS
600.0000 mg | ORAL_TABLET | Freq: Two times a day (BID) | ORAL | Status: DC
  Filled 2014-12-11 (×5): qty 1

## 2014-12-11 MED ORDER — ENOXAPARIN SODIUM 40 MG/0.4ML ~~LOC~~ SOLN
40.0000 mg | SUBCUTANEOUS | Status: DC
  Administered 2014-12-11: 40 mg via SUBCUTANEOUS
  Filled 2014-12-11: qty 0.4

## 2014-12-11 MED ORDER — LINEZOLID 600 MG PO TABS: 600.0000 mg | ORAL_TABLET | Freq: Two times a day (BID) | ORAL | Status: DC

## 2014-12-11 NOTE — Progress Notes (Signed)
PHARMACIST - PHYSICIAN COMMUNICATION DR:   Jerilee Hoh CONCERNING: Antibiotic IV to Oral Route Change Policy  RECOMMENDATION: This patient is receiving Zyvox by the intravenous route.  Based on criteria approved by the Pharmacy and Therapeutics Committee, the antibiotic(s) is/are being converted to the equivalent oral dose form(s).   DESCRIPTION: These criteria include:  Patient being treated for a respiratory tract infection, urinary tract infection, cellulitis or clostridium difficile associated diarrhea if on metronidazole  The patient is not neutropenic and does not exhibit a GI malabsorption state  The patient is eating (either orally or via tube) and/or has been taking other orally administered medications for a least 24 hours  The patient is improving clinically and has a Tmax < 100.5  If you have questions about this conversion, please contact the Pharmacy Department  [x]   [phone] )  [facility] []   [phone] )  [facility] []   [phone] )  [facility] []   [phone] )  [facility] []   [phone] )  [place], PharmD, BCPS 12/11/2014@9 :04 AM

## 2014-12-11 NOTE — Evaluation (Signed)
Physical Therapy Evaluation Patient Details Name: Brandy Mcguire MRN: [id number] DOB: 24-Dec-1933 Today's Date: 12/11/2014   History of Present Illness  81 yo female h/o dementia, htn, paf, chf, cva sent in by family from home via EMS for "not acting right". History obtained from ed staff, as pt is demented and family has not arrived. Reported that she is on some antibiotic for a uti. Today she was more confused than usual and "not acting right". On arrival to ED her sbp was in the 28s. After review of her record, she did have recent urine cx from 6/10 growing mdr enterococcus. She has received 2 liters ivf boluses and her bp is much improved. We are asked to admit her for her infection. Pt denies any pain anywhere. Pleasantly does not know what is going on and why she is here.  Clinical Impression   Pt is seen again for therapy eval.  I did a chart review and pt was seen by me in April.  She exhibited exactly the same characteristics then as she does today.  She was pleasant but dementia is fairly severe.  She did not know her last name.  She was able to follow most directions and strength was WNL.  She was able to sit at the EOB independently but was very resistant to standing.  She became somewhat agitated and insisted on lying back down.  She went to the [facility] after d/c during last admission and spent about a month.  MD notes at that time indicate that she was able to walk a bit with a walker and uses a w/c.  She was reported to be very fearful of falling at that time as well.  I do not feel that continuing PT will be of any benefit and her major limitation is that of poor cognition.  I would recommend d/c to home as long as her son can continue to manage her.    Follow Up Recommendations No PT follow up    Equipment Recommendations  None recommended by PT    Recommendations for Other Services   none    Precautions / Restrictions Precautions Precautions: Fall Precaution Comments:  orange contact Restrictions Weight Bearing Restrictions: No      Mobility  Bed Mobility Overal bed mobility: Modified Independent                Transfers Overall transfer level: Needs assistance Equipment used: Rolling walker (2 wheeled)             General transfer comment: pt became too fearful of trying to stand and ultimately refused...she should have the strength to stand but historically has been resistant to standing and trying to walk  Ambulation/Gait             General Gait Details: refuses to try to walk  Stairs            Wheelchair Mobility    Modified Rankin (Stroke Patients Only)       Balance Overall balance assessment:  (sitting balance is good, unable to eval standing balance)                                           Pertinent Vitals/Pain Pain Assessment: No/denies pain    Home Living Family/patient expects to be discharged to:: Private residence Living Arrangements: Children Available Help at Discharge: Family;Available 24 hours/day (  son works as the [employer] aide)           Home Equipment: Environmental consultant - 2 wheels Additional Comments: pt is unable to give full hx    Prior Function           Comments: pt spent about a month at [facility] from April to May and from the MD notes she was walking a small amount and uses a w/c...pt is unable to give any hx     Hand Dominance        Extremity/Trunk Assessment               Lower Extremity Assessment: Overall WFL for tasks assessed         Communication   Communication: No difficulties  Cognition Arousal/Alertness: Awake/alert Behavior During Therapy: WFL for tasks assessed/performed Overall Cognitive Status: History of cognitive impairments - at baseline                      General Comments      Exercises        Assessment/Plan    PT Assessment Patent does not need any further PT services  PT Diagnosis     PT Problem List     PT Treatment Interventions     PT Goals (Current goals can be found in the Care Plan section) Acute Rehab PT Goals PT Goal Formulation: All assessment and education complete, DC therapy    Frequency     Barriers to discharge        Co-evaluation               End of Session Equipment Utilized During Treatment: Gait belt Activity Tolerance:  (treatment limited by fear of falling) Patient left: in bed;with call bell/phone within reach;with bed alarm set           Time: 3888-2800 PT Time Calculation (min) (ACUTE ONLY): 31 min   Charges:   PT Evaluation $Initial PT Evaluation Tier I: 1 Procedure     PT G CodesDemetrios Isaacs L  PT 12/11/2014, 10:25 AM [phone]

## 2014-12-11 NOTE — Care Management Note (Signed)
Case Management Note  Patient Details  Name: Brandy Mcguire MRN: [id number] Date of Birth: June 24, 1934  Subjective/Objective:                  Pt admitted from home with UTI. Pt lives with her son and will return home at discharge. Attempted to contact son X3 and left VM each time. Pts daughter, Brandy Mcguire, stated that the son is pts caregiver and she does not know much about the needs of her mother. Pt is active with [facility] RN. Pt was able to tell Cm that she has a walker and w/c for home use.   Action/Plan: Pt is discharged home today. Per pts daughter, pts son has been out of town. Pts daughter is aware that pt is being discharged home today. [place] services to resume with in 48 hours of discharge with [facility] (per pts choice). Romualdo Bolk of [facility] is aware and will collect the pts information from the chart. No new DME needs noted. Pt and pts daughter and pts nurse aware of discharge arrangements.  Expected Discharge Date:                  Expected Discharge Plan:  [place]  In-House Referral:  NA  Discharge planning Services  CM Consult  Post Acute Care Choice:  Resumption of Svcs/PTA Provider Choice offered to:  Patient, Adult Children  DME Arranged:    DME Agency:     HH Arranged:  RN [place] Agency:  [place]  Status of Service:  Completed, signed off  Medicare Important Message Given:    Date Medicare IM Given:    Medicare IM give by:    Date Additional Medicare IM Given:    Additional Medicare Important Message give by:     If discussed at [place] of Stay Meetings, dates discussed:    Additional Comments:  [name], RN 12/11/2014, 4:39 PM

## 2014-12-11 NOTE — Progress Notes (Signed)
Patient with orders to be discharge home. Discharge instructions reviewed with son, verbalized understanding. Prescriptions given. Patient stable. Patient left in private vehicle with son.

## 2014-12-11 NOTE — Discharge Summary (Signed)
Physician Discharge Summary  Brandy Mcguire DPO:[id number] DOB: [birth year] DOA: 12/10/2014  PCP: [name], MD  Admit date: 12/10/2014 Discharge date: 12/11/2014  Time spent: 45 minutes  Recommendations for Outpatient Follow-up:  -Will be discharged home today. -Advised to follow-up with primary care provider in 2 weeks. -Home health services will be resumed upon discharge.   Discharge Diagnoses:  Principal Problem:   Sepsis due to enterococcus Active Problems:   HTN (hypertension)   History of CVA (cerebrovascular accident)   Renal artery stenosis, left   Recurrent UTI   PAF (paroxysmal atrial fibrillation)   CKD (chronic kidney disease) stage 3, GFR 30-59 ml/min   Urinary retention   Encephalopathy acute   Dementia   Discharge Condition: Stable and improved  Filed Weights   12/10/14 0008 12/10/14 0458 12/11/14 0557  Weight: 81.647 kg (180 lb) 75.615 kg (166 lb 11.2 oz) 77 kg (169 lb 12.1 oz)    History of present illness:  81 yo female h/o dementia, htn, paf, chf, cva sent in by family from home via EMS for "not acting right". History obtained from ed staff, as pt is demented and family has not arrived. Reported that she is on some antibiotic for a uti. Today she was more confused than usual and "not acting right". On arrival to ED her sbp was in the 97s. After review of her record, she did have recent urine cx from 6/10 growing mdr enterococcus. She has received 2 liters ivf boluses and her bp is much improved. We are asked to admit her for her infection. Pt denies any pain anywhere. Pleasantly does not know what is going on and why she is here.  Hospital Course:   Sepsis due to VRE -Source is the urine. -Blood pressure stabilized after fluid resuscitation. -Based on culture data from 6/10 showing vancomycin-resistant enterococcus, Zyvox was appropriately initiated upon admission. -She has rapidly improved and will be discharged home on 7 days  of Zyvox.  Acute on chronic enc ephalopathy  -due to sepsis on top of chronic dementia. -Per daughter, mental status is currently at baseline.  Paroxysmal atrial fibrillation -Rate controlled. Not chronically anticoagulated for unclear reasons.   Procedures:  None   Consultations:  None  Discharge Instructions  Discharge Instructions    Diet - low sodium heart healthy    Complete by:  As directed      Increase activity slowly    Complete by:  As directed             Medication List    STOP taking these medications        furosemide 40 MG tablet  Commonly known as:  LASIX     potassium chloride 20 MEQ packet  Commonly known as:  KLOR-CON      TAKE these medications        amLODipine 5 MG tablet  Commonly known as:  NORVASC  Take 5 mg by mouth daily.     atorvastatin 40 MG tablet  Commonly known as:  LIPITOR  Take 40 mg by mouth at bedtime.     bisacodyl 5 MG EC tablet  Commonly known as:  DULCOLAX  Take 10 mg by mouth daily as needed for mild constipation or moderate constipation.     clopidogrel 75 MG tablet  Commonly known as:  PLAVIX  Take 1 tablet (75 mg total) by mouth at bedtime. Resume in 1 week if hemoglobin stable     diltiazem 30 MG  tablet  Commonly known as:  CARDIZEM  Take 1 tablet (30 mg total) by mouth every 8 (eight) hours.     ferrous sulfate 325 (65 FE) MG tablet  Take 325 mg by mouth daily with breakfast.     ipratropium-albuterol 0.5-2.5 (3) MG/3ML Soln  Commonly known as:  DUONEB  Take 3 mLs by nebulization every 6 (six) hours.     linezolid 600 MG tablet  Commonly known as:  ZYVOX  Take 1 tablet (600 mg total) by mouth every 12 (twelve) hours.     senna-docusate 8.6-50 MG per tablet  Commonly known as:  Senokot-S  Take 1 tablet by mouth daily.     traZODone 50 MG tablet  Commonly known as:  DESYREL  Take 50 mg by mouth at bedtime.       No Known Allergies     Follow-up Information    Follow up with [facility].   Contact information:   [address] [place] [address] [phone]        The results of significant diagnostics from this hospitalization (including imaging, microbiology, ancillary and laboratory) are listed below for reference.    Significant Diagnostic Studies: Dg Chest Port 1 View  12/10/2014   CLINICAL DATA:  Hypotension and altered mental status tonight. Under treatment for urinary tract infection.  EXAM: PORTABLE CHEST - 1 VIEW  COMPARISON:  10/02/2014  FINDINGS: Dilated and tortuous thoracic aorta. Normal heart size and pulmonary vascularity. Lungs are clear and expanded. No blunting of costophrenic angles. No pneumothorax. Similar appearance to previous study.  IMPRESSION: Tortuous and dilated thoracic aorta. Lungs clear. No change since prior study.   Electronically Signed   By: Lucienne Capers M.D.   On: 12/10/2014 02:24    Microbiology: Recent Results (from the past 240 hour(s))  Urine culture     Status: None   Collection Time: 12/06/14  2:35 PM  Result Value Ref Range Status   Specimen Description URINE, RANDOM  Final   Special Requests NONE  Final   Colony Count   Final    >=100,000 COLONIES/ML Performed at [facility]    Culture   Final    VANCOMYCIN RESISTANT ENTEROCOCCUS ISOLATED Note: CRITICAL RESULT CALLED TO, READ BACK BY AND VERIFIED WITH: LISA 6/14 AT 0825 BY [facility] Performed at [facility]    Report Status 12/10/2014 FINAL  Final   Organism ID, Bacteria VANCOMYCIN RESISTANT ENTEROCOCCUS ISOLATED  Final      Susceptibility   Vancomycin resistant enterococcus isolated - MIC*    AMPICILLIN >=32 RESISTANT Resistant     LEVOFLOXACIN >=8 RESISTANT Resistant     NITROFURANTOIN 128 RESISTANT Resistant     TETRACYCLINE <=1 SENSITIVE Sensitive     LINEZOLID 2 SENSITIVE Sensitive     SYNERCID. 1 SENSITIVE Sensitive     VANCOMYCIN 256 RESISTANT Resistant     * VANCOMYCIN RESISTANT ENTEROCOCCUS ISOLATED    Urine culture     Status: None (Preliminary result)   Collection Time: 12/10/14  2:55 AM  Result Value Ref Range Status   Specimen Description URINE, CLEAN CATCH  Final   Special Requests PT ON ? ANTIBIOTIC  Final   Culture PENDING  Incomplete   Report Status PENDING  Incomplete  Culture, blood (routine x 2)     Status: None (Preliminary result)   Collection Time: 12/10/14  4:50 AM  Result Value Ref Range Status   Specimen Description BLOOD LEFT ARM  Final  Special Requests BOTTLES DRAWN AEROBIC AND ANAEROBIC 10CC  Final   Culture NO GROWTH 1 DAY  Final   Report Status PENDING  Incomplete  Culture, blood (routine x 2)     Status: None (Preliminary result)   Collection Time: 12/10/14  5:05 AM  Result Value Ref Range Status   Specimen Description BLOOD LEFT HAND  Final   Special Requests BOTTLES DRAWN AEROBIC AND ANAEROBIC 6CC  Final   Culture NO GROWTH 1 DAY  Final   Report Status PENDING  Incomplete     Labs: Basic Metabolic Panel:  Recent Labs Lab 12/10/14 0039 12/11/14 0555  NA 130* 132*  K 3.6 3.6  CL 90* 98*  CO2 30 27  GLUCOSE 90 77  BUN 14 11  CREATININE 1.96* 1.44*  CALCIUM 8.5* 8.1*   Liver Function Tests:  Recent Labs Lab 12/10/14 0039  AST 28  ALT 8*  ALKPHOS 39  BILITOT 0.8  PROT 6.6  ALBUMIN 3.3*   No results for input(s): LIPASE, AMYLASE in the last 168 hours. No results for input(s): AMMONIA in the last 168 hours. CBC:  Recent Labs Lab 12/10/14 0039  WBC 4.8  HGB 12.1  HCT 37.0  MCV 93.0  PLT 160   Cardiac Enzymes: No results for input(s): CKTOTAL, CKMB, CKMBINDEX, TROPONINI in the last 168 hours. BNP: BNP (last 3 results)  Recent Labs  12/10/14 0039  BNP 95.0    ProBNP (last 3 results) No results for input(s): PROBNP in the last 8760 hours.  CBG: No results for input(s): GLUCAP in the last 168 hours.     SignedLelon Frohlich  [facility] Pager: [phone] 12/11/2014, 7:35 PM

## 2014-12-12 LAB — URINE CULTURE

## 2014-12-15 ENCOUNTER — Inpatient Hospital Stay (HOSPITAL_COMMUNITY)
Admission: EM | Admit: 2014-12-15 | Discharge: 2014-12-17 | DRG: 690 | Disposition: A | Discharge status: Home Health | Payer: Medicare Other | Attending: Internal Medicine | Admitting: Internal Medicine

## 2014-12-15 ENCOUNTER — Emergency Department (HOSPITAL_COMMUNITY): Payer: Medicare Other

## 2014-12-15 ENCOUNTER — Encounter (HOSPITAL_COMMUNITY): Payer: Self-pay | Admitting: Emergency Medicine

## 2014-12-15 DIAGNOSIS — I480 Paroxysmal atrial fibrillation: Secondary | ICD-10-CM | POA: Diagnosis present

## 2014-12-15 DIAGNOSIS — Z825 Family history of asthma and other chronic lower respiratory diseases: Secondary | ICD-10-CM | POA: Diagnosis not present

## 2014-12-15 DIAGNOSIS — Z9981 Dependence on supplemental oxygen: Secondary | ICD-10-CM | POA: Diagnosis not present

## 2014-12-15 DIAGNOSIS — Z8249 Family history of ischemic heart disease and other diseases of the circulatory system: Secondary | ICD-10-CM | POA: Diagnosis not present

## 2014-12-15 DIAGNOSIS — N183 Chronic kidney disease, stage 3 unspecified: Secondary | ICD-10-CM | POA: Diagnosis present

## 2014-12-15 DIAGNOSIS — J9611 Chronic respiratory failure with hypoxia: Secondary | ICD-10-CM | POA: Diagnosis present

## 2014-12-15 DIAGNOSIS — I129 Hypertensive chronic kidney disease with stage 1 through stage 4 chronic kidney disease, or unspecified chronic kidney disease: Secondary | ICD-10-CM | POA: Diagnosis present

## 2014-12-15 DIAGNOSIS — Z8673 Personal history of transient ischemic attack (TIA), and cerebral infarction without residual deficits: Secondary | ICD-10-CM | POA: Diagnosis not present

## 2014-12-15 DIAGNOSIS — N390 Urinary tract infection, site not specified: Secondary | ICD-10-CM | POA: Diagnosis not present

## 2014-12-15 DIAGNOSIS — J9610 Chronic respiratory failure, unspecified whether with hypoxia or hypercapnia: Secondary | ICD-10-CM | POA: Diagnosis present

## 2014-12-15 DIAGNOSIS — E039 Hypothyroidism, unspecified: Secondary | ICD-10-CM | POA: Diagnosis present

## 2014-12-15 DIAGNOSIS — R339 Retention of urine, unspecified: Secondary | ICD-10-CM | POA: Diagnosis present

## 2014-12-15 DIAGNOSIS — Z87891 Personal history of nicotine dependence: Secondary | ICD-10-CM

## 2014-12-15 DIAGNOSIS — K5641 Fecal impaction: Secondary | ICD-10-CM | POA: Diagnosis present

## 2014-12-15 DIAGNOSIS — F0390 Unspecified dementia without behavioral disturbance: Secondary | ICD-10-CM | POA: Diagnosis present

## 2014-12-15 DIAGNOSIS — B952 Enterococcus as the cause of diseases classified elsewhere: Secondary | ICD-10-CM | POA: Diagnosis present

## 2014-12-15 DIAGNOSIS — I10 Essential (primary) hypertension: Secondary | ICD-10-CM | POA: Diagnosis present

## 2014-12-15 DIAGNOSIS — E860 Dehydration: Secondary | ICD-10-CM | POA: Diagnosis present

## 2014-12-15 DIAGNOSIS — D696 Thrombocytopenia, unspecified: Secondary | ICD-10-CM | POA: Diagnosis present

## 2014-12-15 DIAGNOSIS — Z9181 History of falling: Secondary | ICD-10-CM | POA: Diagnosis not present

## 2014-12-15 DIAGNOSIS — K5900 Constipation, unspecified: Secondary | ICD-10-CM | POA: Diagnosis not present

## 2014-12-15 DIAGNOSIS — Z7902 Long term (current) use of antithrombotics/antiplatelets: Secondary | ICD-10-CM | POA: Diagnosis not present

## 2014-12-15 DIAGNOSIS — R627 Adult failure to thrive: Secondary | ICD-10-CM | POA: Diagnosis present

## 2014-12-15 DIAGNOSIS — R531 Weakness: Secondary | ICD-10-CM | POA: Diagnosis present

## 2014-12-15 LAB — CBC
HCT: 37.9 % (ref 36.0–46.0)
Hemoglobin: 12.3 g/dL (ref 12.0–15.0)
MCH: 30.4 pg (ref 26.0–34.0)
MCHC: 32.5 g/dL (ref 30.0–36.0)
MCV: 93.8 fL (ref 78.0–100.0)
Platelets: 144 10*3/uL — ABNORMAL LOW (ref 150–400)
RBC: 4.04 MIL/uL (ref 3.87–5.11)
RDW: 15 % (ref 11.5–15.5)
WBC: 4.3 10*3/uL (ref 4.0–10.5)

## 2014-12-15 LAB — BASIC METABOLIC PANEL
ANION GAP: 10 (ref 5–15)
BUN: 8 mg/dL (ref 6–20)
CALCIUM: 8.4 mg/dL — AB (ref 8.9–10.3)
CO2: 28 mmol/L (ref 22–32)
Chloride: 96 mmol/L — ABNORMAL LOW (ref 101–111)
Creatinine, Ser: 1.72 mg/dL — ABNORMAL HIGH (ref 0.44–1.00)
GFR calc Af Amer: 31 mL/min — ABNORMAL LOW (ref >60)
GFR, EST NON AFRICAN AMERICAN: 27 mL/min — AB (ref >60)
Glucose, Bld: 86 mg/dL (ref 65–99)
POTASSIUM: 3.6 mmol/L (ref 3.5–5.1)
SODIUM: 134 mmol/L — AB (ref 135–145)

## 2014-12-15 LAB — URINALYSIS, ROUTINE W REFLEX MICROSCOPIC
Bilirubin Urine: NEGATIVE
Glucose, UA: NEGATIVE mg/dL
Ketones, ur: NEGATIVE mg/dL
Nitrite: NEGATIVE
Specific Gravity, Urine: 1.015 (ref 1.005–1.030)
Urobilinogen, UA: 0.2 mg/dL (ref 0.0–1.0)
pH: 7 (ref 5.0–8.0)

## 2014-12-15 LAB — URINE MICROSCOPIC-ADD ON

## 2014-12-15 LAB — TSH: TSH: 4.99 u[IU]/mL — ABNORMAL HIGH (ref 0.350–4.500)

## 2014-12-15 MED ORDER — CLOPIDOGREL BISULFATE 75 MG PO TABS
75.0000 mg | ORAL_TABLET | Freq: Every day | ORAL | Status: DC
  Administered 2014-12-15 – 2014-12-16 (×2): 75 mg via ORAL
  Filled 2014-12-15 (×2): qty 1

## 2014-12-15 MED ORDER — ONDANSETRON HCL 4 MG PO TABS: 4.0000 mg | ORAL_TABLET | Freq: Four times a day (QID) | ORAL | Status: DC | PRN

## 2014-12-15 MED ORDER — LINEZOLID 600 MG/300ML IV SOLN
600.0000 mg | Freq: Two times a day (BID) | INTRAVENOUS | Status: DC
  Administered 2014-12-15: 600 mg via INTRAVENOUS
  Filled 2014-12-15 (×4): qty 300

## 2014-12-15 MED ORDER — SODIUM CHLORIDE 0.9 % IV SOLN: INTRAVENOUS | Status: DC

## 2014-12-15 MED ORDER — ACETAMINOPHEN 325 MG PO TABS
650.0000 mg | ORAL_TABLET | Freq: Four times a day (QID) | ORAL | Status: DC | PRN
Start: 2014-12-15 — End: 2014-12-17

## 2014-12-15 MED ORDER — HEPARIN SODIUM (PORCINE) 5000 UNIT/ML IJ SOLN
5000.0000 [IU] | Freq: Three times a day (TID) | INTRAMUSCULAR | Status: DC
  Administered 2014-12-15 – 2014-12-17 (×7): 5000 [IU] via SUBCUTANEOUS
  Filled 2014-12-15 (×7): qty 1

## 2014-12-15 MED ORDER — TRAZODONE HCL 50 MG PO TABS
50.0000 mg | ORAL_TABLET | Freq: Every day | ORAL | Status: DC
  Administered 2014-12-15 – 2014-12-16 (×2): 50 mg via ORAL
  Filled 2014-12-15 (×2): qty 1

## 2014-12-15 MED ORDER — DILTIAZEM HCL 30 MG PO TABS
30.0000 mg | ORAL_TABLET | Freq: Three times a day (TID) | ORAL | Status: DC
  Administered 2014-12-15 – 2014-12-17 (×7): 30 mg via ORAL
  Filled 2014-12-15 (×7): qty 1

## 2014-12-15 MED ORDER — IPRATROPIUM-ALBUTEROL 0.5-2.5 (3) MG/3ML IN SOLN
3.0000 mL | Freq: Four times a day (QID) | RESPIRATORY_TRACT | Status: DC
  Administered 2014-12-15: 3 mL via RESPIRATORY_TRACT
  Filled 2014-12-15: qty 3

## 2014-12-15 MED ORDER — LINEZOLID 600 MG/300ML IV SOLN
INTRAVENOUS | Status: AC
  Filled 2014-12-15: qty 300

## 2014-12-15 MED ORDER — ATORVASTATIN CALCIUM 40 MG PO TABS
40.0000 mg | ORAL_TABLET | Freq: Every day | ORAL | Status: DC
  Administered 2014-12-15 – 2014-12-16 (×2): 40 mg via ORAL
  Filled 2014-12-15 (×2): qty 1

## 2014-12-15 MED ORDER — IPRATROPIUM-ALBUTEROL 0.5-2.5 (3) MG/3ML IN SOLN: 3.0000 mL | Freq: Four times a day (QID) | RESPIRATORY_TRACT | Status: DC | PRN

## 2014-12-15 MED ORDER — SODIUM CHLORIDE 0.9 % IV BOLUS (SEPSIS)
250.0000 mL | Freq: Once | INTRAVENOUS | Status: AC
  Administered 2014-12-15: 250 mL via INTRAVENOUS

## 2014-12-15 MED ORDER — SODIUM CHLORIDE 0.9 % IV SOLN
INTRAVENOUS | Status: DC
  Administered 2014-12-15 – 2014-12-16 (×2): via INTRAVENOUS

## 2014-12-15 MED ORDER — CETYLPYRIDINIUM CHLORIDE 0.05 % MT LIQD
7.0000 mL | Freq: Two times a day (BID) | OROMUCOSAL | Status: DC
  Administered 2014-12-15 – 2014-12-17 (×4): 7 mL via OROMUCOSAL

## 2014-12-15 MED ORDER — ONDANSETRON HCL 4 MG/2ML IJ SOLN: 4.0000 mg | Freq: Four times a day (QID) | INTRAMUSCULAR | Status: DC | PRN

## 2014-12-15 MED ORDER — ACETAMINOPHEN 650 MG RE SUPP: 650.0000 mg | Freq: Four times a day (QID) | RECTAL | Status: DC | PRN

## 2014-12-15 MED ORDER — SENNOSIDES-DOCUSATE SODIUM 8.6-50 MG PO TABS
1.0000 | ORAL_TABLET | Freq: Every day | ORAL | Status: DC
  Administered 2014-12-16 – 2014-12-17 (×2): 1 via ORAL
  Filled 2014-12-15 (×3): qty 1

## 2014-12-15 NOTE — ED Provider Notes (Addendum)
CSN: [id number]     Arrival date & time 12/15/14  1207 History  This chart was scribed for [name], MD by Marlowe Kays, ED Scribe. This patient was seen in room APA03/APA03 and the patient's care was started at 12:28 PM.  Chief Complaint  Patient presents with  . Failure To Thrive   The history is provided by the EMS personnel and medical records. No language interpreter was used.   LEVEL 5 CAVEAT- Full history could not be obtained due to dementia.   HPI Comments:  Brandy Mcguire is a 81 y.o. female brought in by EMS, who presents to the Emergency Department complaining of failure to thrive. She states she was told that she needed to come back to the hospital but she was unsure of what was going on. She was recently discharged from the hospital from being admitted 5 days ago for AMS and hypotension. Per EMS, family states she has not gotten any better since the previous hospital admission.  Past Medical History  Diagnosis Date  . Stroke   . CAD (coronary artery disease)   . Hypertension   . Bite from cat 03/17/2011  . CHF (congestive heart failure)   . PAF (paroxysmal atrial fibrillation)   . Hypothyroidism   . Dementia   . CKD (chronic kidney disease) stage 3, GFR 30-59 ml/min 09/28/2014  . Thoracoabdominal aortic aneurysm 09/29/2014    6.4 x 6.0 cm; previously 4.2 cm.  . Traumatic hematoma of buttock 09/28/2014   Past Surgical History  Procedure Laterality Date  . Stents       kidneys and 2 in heart  . Abdominal hysterectomy     Family History  Problem Relation Age of Onset  . Cancer Mother   . Cancer Sister   . COPD Sister   . Heart failure Sister    History  Substance Use Topics  . Smoking status: Former Smoker -- 1.00 packs/day for 50 years    Types: Cigarettes    Quit date: 04/03/2001  . Smokeless tobacco: Never Used  . Alcohol Use: No   OB History    Gravida Para Term Preterm AB TAB SAB Ectopic Multiple Living   7 7 7       5      LEVEL 5 CAVEAT-  Full history could not be obtained due to dementia.   Review of Systems  Unable to perform ROS   Allergies  Review of patient's allergies indicates no known allergies.  Home Medications   Prior to Admission medications   Medication Sig Start Date End Date Taking? Authorizing Provider  amLODipine (NORVASC) 5 MG tablet Take 5 mg by mouth daily.   Yes Historical Provider, MD  atorvastatin (LIPITOR) 40 MG tablet Take 40 mg by mouth at bedtime.   Yes Historical Provider, MD  bisacodyl (DULCOLAX) 5 MG EC tablet Take 10 mg by mouth daily as needed for mild constipation or moderate constipation.   Yes Historical Provider, MD  clopidogrel (PLAVIX) 75 MG tablet Take 1 tablet (75 mg total) by mouth at bedtime. Resume in 1 week if hemoglobin stable 10/03/14  Yes Kathie Dike, MD  diltiazem (CARDIZEM) 30 MG tablet Take 1 tablet (30 mg total) by mouth every 8 (eight) hours. 10/03/14  Yes Kathie Dike, MD  ferrous sulfate 325 (65 FE) MG tablet Take 325 mg by mouth daily with breakfast.   Yes Historical Provider, MD  ipratropium-albuterol (DUONEB) 0.5-2.5 (3) MG/3ML SOLN Take 3 mLs by nebulization every 6 (six) hours.  10/03/14  Yes Kathie Dike, MD  linezolid (ZYVOX) 600 MG tablet Take 1 tablet (600 mg total) by mouth every 12 (twelve) hours. 12/11/14  Yes Estela Leonie Green, MD  senna-docusate (SENOKOT-S) 8.6-50 MG per tablet Take 1 tablet by mouth daily.   Yes Historical Provider, MD  traZODone (DESYREL) 50 MG tablet Take 50 mg by mouth at bedtime.     Yes Historical Provider, MD   Triage Vitals: BP 112/89 mmHg  Pulse 97  Temp(Src) 97.5 F (36.4 C) (Oral)  Resp 18  Ht 5\' 8"  (1.727 m)  Wt 165 lb (74.844 kg)  BMI 25.09 kg/m2  SpO2 92% Physical Exam  Constitutional: She is oriented to person, place, and time. She appears well-developed and well-nourished.  HENT:  Head: Normocephalic and atraumatic.  Eyes: EOM are normal.  Neck: Normal range of motion.  Cardiovascular: Normal rate and  normal heart sounds.  An irregular rhythm present.  No murmur heard. Pulmonary/Chest: Effort normal and breath sounds normal. No respiratory distress.  Abdominal: Soft. There is no tenderness.  Musculoskeletal: Normal range of motion.  Neurological: She is alert and oriented to person, place, and time. No cranial nerve deficit. She exhibits normal muscle tone. Coordination normal.  Skin: Skin is warm and dry.  Psychiatric: She has a normal mood and affect. Her behavior is normal.  Nursing note and vitals reviewed.   ED Course  Procedures (including critical care time) DIAGNOSTIC STUDIES: Oxygen Saturation is 92% on 3 L/min, low by my interpretation.   COORDINATION OF CARE: 12:33 PM- Will order labs. Pt verbalizes understanding and agrees to plan.  Medications  0.9 %  sodium chloride infusion (not administered)  sodium chloride 0.9 % bolus 250 mL (0 mLs Intravenous Stopped 12/15/14 1345)    Labs Review Labs Reviewed  CBC - Abnormal; Notable for the following:    Platelets 144 (*)    All other components within normal limits  BASIC METABOLIC PANEL - Abnormal; Notable for the following:    Sodium 134 (*)    Chloride 96 (*)    Creatinine, Ser 1.72 (*)    Calcium 8.4 (*)    GFR calc non Af Amer 27 (*)    GFR calc Af Amer 31 (*)    All other components within normal limits  URINALYSIS, ROUTINE W REFLEX MICROSCOPIC (NOT AT [facility]) - Abnormal; Notable for the following:    APPearance HAZY (*)    Hgb urine dipstick LARGE (*)    Protein, ur TRACE (*)    Leukocytes, UA MODERATE (*)    All other components within normal limits  URINE MICROSCOPIC-ADD ON - Abnormal; Notable for the following:    Squamous Epithelial / LPF FEW (*)    Bacteria, UA MANY (*)    Crystals CA OXALATE CRYSTALS (*)    All other components within normal limits   Results for orders placed or performed during the hospital encounter of 12/15/14  CBC  Result Value Ref Range   WBC 4.3 4.0 - 10.5 K/uL   RBC 4.04  3.87 - 5.11 MIL/uL   Hemoglobin 12.3 12.0 - 15.0 g/dL   HCT 37.9 36.0 - 46.0 %   MCV 93.8 78.0 - 100.0 fL   MCH 30.4 26.0 - 34.0 pg   MCHC 32.5 30.0 - 36.0 g/dL   RDW 15.0 11.5 - 15.5 %   Platelets 144 (L) 150 - 400 K/uL  Basic metabolic panel  Result Value Ref Range   Sodium 134 (L) 135 -  145 mmol/L   Potassium 3.6 3.5 - 5.1 mmol/L   Chloride 96 (L) 101 - 111 mmol/L   CO2 28 22 - 32 mmol/L   Glucose, Bld 86 65 - 99 mg/dL   BUN 8 6 - 20 mg/dL   Creatinine, Ser 1.72 (H) 0.44 - 1.00 mg/dL   Calcium 8.4 (L) 8.9 - 10.3 mg/dL   GFR calc non Af Amer 27 (L) >60 mL/min   GFR calc Af Amer 31 (L) >60 mL/min   Anion gap 10 5 - 15  Urinalysis, Routine w reflex microscopic (not at [facility])  Result Value Ref Range   Color, Urine YELLOW YELLOW   APPearance HAZY (A) CLEAR   Specific Gravity, Urine 1.015 1.005 - 1.030   pH 7.0 5.0 - 8.0   Glucose, UA NEGATIVE NEGATIVE mg/dL   Hgb urine dipstick LARGE (A) NEGATIVE   Bilirubin Urine NEGATIVE NEGATIVE   Ketones, ur NEGATIVE NEGATIVE mg/dL   Protein, ur TRACE (A) NEGATIVE mg/dL   Urobilinogen, UA 0.2 0.0 - 1.0 mg/dL   Nitrite NEGATIVE NEGATIVE   Leukocytes, UA MODERATE (A) NEGATIVE  Urine microscopic-add on  Result Value Ref Range   Squamous Epithelial / LPF FEW (A) RARE   WBC, UA 21-50 <3 WBC/hpf   RBC / HPF 21-50 <3 RBC/hpf   Bacteria, UA MANY (A) RARE   Crystals CA OXALATE CRYSTALS (A) NEGATIVE     Imaging Review Dg Chest Port 1 View  12/15/2014   CLINICAL DATA:  Failure to thrive  EXAM: PORTABLE CHEST - 1 VIEW  COMPARISON:  12/10/2014  FINDINGS: Heart is borderline enlarged. Marked tortuosity of the thoracic aorta traversing into the right lower lung zone is stable. Minimal subsegmental atelectasis at the right lung base. Left lung is clear. No pneumothorax or pleural effusion.  IMPRESSION: No active cardiopulmonary disease.   Electronically Signed   By: Marybelle Killings M.D.   On: 12/15/2014 13:20     EKG Interpretation   Date/Time:   Sunday December 15 2014 12:19:47 EDT Ventricular Rate:  98 PR Interval:    QRS Duration: 151 QT Interval:  430 QTC Calculation: 549 R Axis:   -85 Text Interpretation:  Atrial fibrillation Right bundle branch block  Inferior infarct, old No significant change since last tracing Confirmed  by Lourie Retz  MD, Sitlali Koerner [phone]) on 12/15/2014 12:25:45 PM      MDM   Final diagnoses:  Failure to thrive in adult    Patient brought in by EMS patient has a history of dementia so not sure how accurate historical information is from her. Patient was admitted on June 14 for hypotension which appeared to be somewhat tight into a urinary tract infection. Culture was done on that it grew enterococcus that was resistant to vancomycin. Patient was treated with the linezolid. However urinalysis today still is consistent with infection. The patient denies being on oxygen however was brought in by EMS on oxygen nurse report from EMS stated they started her on oxygen. Off of oxygen she will occasionally desat down to 88%. Patient also has had some fairly persistent tachycardia highest being 125. Currently 91.  Chest x-rays negative no signs of pneumonia. Labs otherwise are without any significant abnormalities. Is no leukocytosis.  Discussed with hospitalist on call there to come see the patient in consultation. The questions to be answered or should she be admitted and continue with IV antibodies since she does not seem to be improving on the oral antibody. We don't have many options  for antibodies on the sensitivity parameters from the culture.  I personally performed the services described in this documentation, which was scribed in my presence. The recorded information has been reviewed and is accurate.    [name], MD 12/15/14 1400  Addendum: Nurse is trying to get in contact with a family member.  [name], MD 12/15/14 1401  Rarely get a hold of the patient's daughter. Does live with her  son. Patient uses oxygen at home intermittently. Also the anabolic the patient was prescribed at discharge from the hospital was too expensive and they did not get it filled. We'll let the hospitalist be aware of this.  [name], MD 12/15/14 1414

## 2014-12-15 NOTE — ED Notes (Signed)
Pt was recently seen for urinary symptoms. Per EMS, pt family reports pt has not improved and reports continued weakness and loss of appetite. Pt alert. nad noted. cbg 94 en route.

## 2014-12-15 NOTE — ED Notes (Signed)
hospitalist at bedside

## 2014-12-15 NOTE — ED Notes (Signed)
Patient's son, Sonia Side arrives as patient is being transferred to 324. He was updated about his mother's care and admission, stated he would be back later.

## 2014-12-15 NOTE — ED Notes (Signed)
MD at bedside. 

## 2014-12-15 NOTE — Progress Notes (Signed)
1529 Patient arrived to 300 unit room# 324 alert and pleasantly confused. Patient reported that she does not walk at home. Patient transferred from stretcher to bed via slide method. IV site noted to LEFT AC, patent, intact, flushes w/o difficulty at this time. Foley cathter in place, draining straw yellow urine at this time. Contact precautions in place d/t Hx of VRE. Patient oriented to room, call bell, and staff. Room temp adjusted d/t c/o being cold. No family present at the bedside at this time.

## 2014-12-15 NOTE — Progress Notes (Signed)
1645 Patient required assistance with eating. She reports she has dentures but she doesn't know where they are at this time and she didn't come to the hospital with them. She is edentulous. This Probation officer assisted patient with feeding and with encouragement consumed < 25% of her dinner and 1/2 glass of tea. She was able to swallow w/o difficulty.

## 2014-12-15 NOTE — ED Notes (Signed)
Can not get in touch with pt's son, Sonia Side. Spoke with Junie Bame who is pt's daughter, who states patient lives with Sonia Side. Arbie Cookey reports patient wears oxygen intermittently. Arbie Cookey also reports that medicare would not pay for antibiotics and patient has been taking previous antibiotics that were prescribed.

## 2014-12-15 NOTE — H&P (Signed)
[facility] History and Physical  Brandy Mcguire:[id number] DOB: 1933-12-17 DOA: 12/15/2014  Referring physician: Dr. Rogene Houston, ER PCP: [name], MD   Chief Complaint: weakness  HPI: Brandy Mcguire is a 81 y.o. female who was recently discharged from the hospital after being treated for an enterococcal urinary tract infection. 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 Patient has dementia and is unable to provide any reliable history. There is no family present at the bedside and I'm unable to contact them by phone. History is obtained from the medical record and ER providers. Per record, patient was apparently not doing well after her discharge. Previous discharge summary indicates the patient's mental status is back to baseline. Apparently she was not eating and drinking, as well as becoming increasingly weak. They did not feel that she was improving and therefore brought her to the emergency room. It is unclear whether she had any fever, vomiting, diarrhea or any other complaints. At this time she does not have any significant complaints. In the emergency room she was noted to be tachycardic on admission with a heart rate of 125. She was otherwise afebrile and blood pressure are stable. She was noted to be hypoxic requiring supplemental oxygen. This is apparently a chronic issue as well, and she often intermittently uses oxygen at home. Workup showed that her renal function was near baseline, she did not have any leukocytosis. He urinalysis shows persistent leukocytes in urine and many bacteria. Further history from family indicated that they were unable to fill her Zyvox due to cost and she has not been taking antibiotic since discharge. Patient is being admitted for further treatments.   Review of Systems:  Unable to assess due to dementia  Past Medical History  Diagnosis Date  . Stroke   . CAD (coronary artery disease)     . Hypertension   . Bite from cat 03/17/2011  . CHF (congestive heart failure)   . PAF (paroxysmal atrial fibrillation)   . Hypothyroidism   . Dementia   . CKD (chronic kidney disease) stage 3, GFR 30-59 ml/min 09/28/2014  . Thoracoabdominal aortic aneurysm 09/29/2014    6.4 x 6.0 cm; previously 4.2 cm.  . Traumatic hematoma of buttock 09/28/2014   Past Surgical History  Procedure Laterality Date  . Stents       kidneys and 2 in heart  . Abdominal hysterectomy     Social History:  reports that she quit smoking about 13 years ago. Her smoking use included Cigarettes. She has a 50 pack-year smoking history. She has never used smokeless tobacco. She reports that she does not drink alcohol or use illicit drugs.  No Known Allergies  Family History  Problem Relation Age of Onset  . Cancer Mother   . Cancer Sister   . COPD Sister   . Heart failure Sister     Prior to Admission medications   Medication Sig Start Date End Date Taking? Authorizing Provider  atorvastatin (LIPITOR) 40 MG tablet Take 40 mg by mouth at bedtime.   Yes Historical Provider, MD  bisacodyl (DULCOLAX) 5 MG EC tablet Take 10 mg by mouth daily as needed for mild constipation or moderate constipation.   Yes Historical Provider, MD  clopidogrel (PLAVIX) 75 MG tablet Take 1 tablet (75 mg total) by mouth at bedtime. Resume in 1 week if hemoglobin stable 10/03/14  Yes Kathie Dike, MD  diltiazem (CARDIZEM) 30 MG  tablet Take 1 tablet (30 mg total) by mouth every 8 (eight) hours. 10/03/14  Yes Kathie Dike, MD  ferrous sulfate 325 (65 FE) MG tablet Take 325 mg by mouth daily with breakfast.   Yes Historical Provider, MD  ipratropium-albuterol (DUONEB) 0.5-2.5 (3) MG/3ML SOLN Take 3 mLs by nebulization every 6 (six) hours. 10/03/14  Yes Kathie Dike, MD  linezolid (ZYVOX) 600 MG tablet Take 1 tablet (600 mg total) by mouth every 12 (twelve) hours. 12/11/14  Yes Estela Leonie Green, MD  senna-docusate (SENOKOT-S) 8.6-50 MG per  tablet Take 1 tablet by mouth daily.   Yes Historical Provider, MD  traZODone (DESYREL) 50 MG tablet Take 50 mg by mouth at bedtime.     Yes Historical Provider, MD   Physical Exam: Filed Vitals:   12/15/14 1320 12/15/14 1330 12/15/14 1345 12/15/14 1400  BP: 106/95 115/87    Pulse: 116 125 91 92  Temp:      TempSrc:      Resp: 21 19 15 15   Height:      Weight:      SpO2: 92% 90% 88% 99%    Wt Readings from Last 3 Encounters:  12/15/14 74.844 kg (165 lb)  12/11/14 77 kg (169 lb 12.1 oz)  09/30/14 83 kg (182 lb 15.7 oz)    General:  Appears calm and comfortable Eyes: PERRL, normal lids, irises & conjunctiva ENT: mucous membranes are dry Neck: no LAD, masses or thyromegaly Cardiovascular: irregular no m/r/g. No LE edema. Respiratory: coarse crackles bilaterally. Normal respiratory effort. Abdomen: soft, ntnd Skin: no rash or induration seen on limited exam Musculoskeletal: grossly normal tone BUE/BLE Psychiatric: mildly confused but answers most questions appropriately Neurologic: grossly non-focal.          Labs on Admission:  Basic Metabolic Panel:  Recent Labs Lab 12/10/14 0039 12/11/14 0555 12/15/14 1224  NA 130* 132* 134*  K 3.6 3.6 3.6  CL 90* 98* 96*  CO2 30 27 28   GLUCOSE 90 77 86  BUN 14 11 8   CREATININE 1.96* 1.44* 1.72*  CALCIUM 8.5* 8.1* 8.4*   Liver Function Tests:  Recent Labs Lab 12/10/14 0039  AST 28  ALT 8*  ALKPHOS 39  BILITOT 0.8  PROT 6.6  ALBUMIN 3.3*   No results for input(s): LIPASE, AMYLASE in the last 168 hours. No results for input(s): AMMONIA in the last 168 hours. CBC:  Recent Labs Lab 12/10/14 0039 12/15/14 1224  WBC 4.8 4.3  HGB 12.1 12.3  HCT 37.0 37.9  MCV 93.0 93.8  PLT 160 144*   Cardiac Enzymes: No results for input(s): CKTOTAL, CKMB, CKMBINDEX, TROPONINI in the last 168 hours.  BNP (last 3 results)  Recent Labs  12/10/14 0039  BNP 95.0    ProBNP (last 3 results) No results for input(s): PROBNP  in the last 8760 hours.  CBG: No results for input(s): GLUCAP in the last 168 hours.  Radiological Exams on Admission: Dg Chest Port 1 View  12/15/2014   CLINICAL DATA:  Failure to thrive  EXAM: PORTABLE CHEST - 1 VIEW  COMPARISON:  12/10/2014  FINDINGS: Heart is borderline enlarged. Marked tortuosity of the thoracic aorta traversing into the right lower lung zone is stable. Minimal subsegmental atelectasis at the right lung base. Left lung is clear. No pneumothorax or pleural effusion.  IMPRESSION: No active cardiopulmonary disease.   Electronically Signed   By: Marybelle Killings M.D.   On: 12/15/2014 13:20    EKG: Independently reviewed. Atrial  fibrillation  Assessment/Plan Principal Problem:   UTI (urinary tract infection) due to Enterococcus Active Problems:   HTN (hypertension)   PAF (paroxysmal atrial fibrillation)   Hypothyroidism   CKD (chronic kidney disease) stage 3, GFR 30-59 ml/min   Dementia   Chronic respiratory failure   Failure to thrive in adult   UTI (urinary tract infection)   Dehydration   1. Enterococcal urinary tract infection. Patient has a history of VRE that was recently diagnosed on last admission. Unfortunately she is not been taking her antibiotic at home due to cost. We'll restart her on Zyvox here in the hospital. 2. Dehydration. Likely related to decreased by mouth intake. Continue IV fluids 3. Chronic respiratory failure. Patient has intermittently required oxygen at home. Chest x-ray does not show any acute findings. Will continue with supplemental oxygen at this time. 4. Paroxysmal atrial fibrillation. Patient is currently in atrial fibrillation. She has a history of falls and is not on anticoagulation. Continue Plavix. Continue diltiazem for rate control. 5. Hypothyroidism. Check TSH and continue Synthroid. 6. C daily. Creatinine appears to be near baseline. Continue to follow. 7. Dementia. Stable. 8. Hypertension. Continue outpatient  regimen. 9. Generalized weakness. Likely related to dehydration and overall failure to thrive. Will request physical therapy consult to see if placement is needed.   Code Status: presumed full code DVT Prophylaxis: heparin [place] Family Communication: no family present, unable to reach family by phone Disposition Plan: pending hospital course  Time spent: 68mins  Shonya Sumida [facility] Pager [phone]

## 2014-12-15 NOTE — ED Notes (Signed)
Nurse to call back for report.  

## 2014-12-16 DIAGNOSIS — R627 Adult failure to thrive: Secondary | ICD-10-CM

## 2014-12-16 DIAGNOSIS — D696 Thrombocytopenia, unspecified: Secondary | ICD-10-CM | POA: Diagnosis present

## 2014-12-16 LAB — CBC
HCT: 36.7 % (ref 36.0–46.0)
Hemoglobin: 11.8 g/dL — ABNORMAL LOW (ref 12.0–15.0)
MCH: 30.3 pg (ref 26.0–34.0)
MCHC: 32.2 g/dL (ref 30.0–36.0)
MCV: 94.3 fL (ref 78.0–100.0)
Platelets: 123 10*3/uL — ABNORMAL LOW (ref 150–400)
RBC: 3.89 MIL/uL (ref 3.87–5.11)
RDW: 15.1 % (ref 11.5–15.5)
WBC: 3.7 10*3/uL — AB (ref 4.0–10.5)

## 2014-12-16 LAB — BASIC METABOLIC PANEL
ANION GAP: 10 (ref 5–15)
BUN: 8 mg/dL (ref 6–20)
CALCIUM: 8 mg/dL — AB (ref 8.9–10.3)
CO2: 24 mmol/L (ref 22–32)
CREATININE: 1.37 mg/dL — AB (ref 0.44–1.00)
Chloride: 100 mmol/L — ABNORMAL LOW (ref 101–111)
GFR calc non Af Amer: 36 mL/min — ABNORMAL LOW (ref >60)
GFR, EST AFRICAN AMERICAN: 41 mL/min — AB (ref >60)
Glucose, Bld: 80 mg/dL (ref 65–99)
Potassium: 3.8 mmol/L (ref 3.5–5.1)
Sodium: 134 mmol/L — ABNORMAL LOW (ref 135–145)

## 2014-12-16 LAB — CULTURE, BLOOD (ROUTINE X 2)
Culture: NO GROWTH
Culture: NO GROWTH

## 2014-12-16 MED ORDER — LINEZOLID 600 MG PO TABS: 600.0000 mg | ORAL_TABLET | Freq: Two times a day (BID) | ORAL | Status: DC

## 2014-12-16 MED ORDER — TAMSULOSIN HCL 0.4 MG PO CAPS
0.4000 mg | ORAL_CAPSULE | Freq: Every day | ORAL | Status: DC
  Administered 2014-12-16 – 2014-12-17 (×2): 0.4 mg via ORAL
  Filled 2014-12-16 (×2): qty 1

## 2014-12-16 MED ORDER — SODIUM CHLORIDE 0.9 % IV BOLUS (SEPSIS): 500.0000 mL | Freq: Once | INTRAVENOUS | Status: DC

## 2014-12-16 MED ORDER — LINEZOLID 600 MG PO TABS
600.0000 mg | ORAL_TABLET | Freq: Two times a day (BID) | ORAL | Status: DC
  Administered 2014-12-16 – 2014-12-17 (×3): 600 mg via ORAL
  Filled 2014-12-16 (×5): qty 1

## 2014-12-16 MED ORDER — TAMSULOSIN HCL 0.4 MG PO CAPS: 0.4000 mg | ORAL_CAPSULE | Freq: Every day | ORAL | Status: AC

## 2014-12-16 NOTE — Progress Notes (Signed)
[facility] PROGRESS NOTE  EFRAT ZUIDEMA KYH:[id number] DOB: 1934-04-13 DOA: 12/15/2014 PCP: [name], MD  Assessment/Plan: 1. Enterococcal urinary tract infection. Patient has a history of VRE and urinary retention discharged recently with foley in setting of no antibiotic at home due to cost. Continue  Zyvox day #2. She is afebrile and non-toxic appearing.  2. Dehydration. Likely related to decreased by mouth intake. She needs lots of encouragement to eat/drink at baseline. Will decrease IV fluids. monitor 3. Chronic respiratory failure. Patient has intermittently required oxygen at home. Chest x-ray without acute findings. Oxygen saturation level 98% on 1.5L . 4. Paroxysmal atrial fibrillation. Rate controlled.  She has a history of falls and is not on anticoagulation. Continue Plavix. Continue diltiazem for rate control. 5. Hypothyroidism. TSH just above high end of normal. Per chart review synthroid discontinued due to normal TSH. Close OP follow up. synthroid. 6. C daily. Creatinine 1.37 better than baseline. Continue to follow. 7. Dementia. Stable. 8. Hypertension. Controlled.  Continue outpatient regimen. 9. Generalized weakness. Likely related to dehydration and overall failure to thrive. Physical therapy evaluated and recommend long term care.  10. Urinary retention: hospitalization in April failed voiding trial and discharged with foley. Had urology follow up appointment 12/16/14 (today). Will attempt voiding trial. Start flomax  Code Status: full Family Communication: son at bedside Disposition Plan: may need placement   Consultants:  none  Procedures:  none  Antibiotics:  zyvox 12/15/14>>  HPI/Subjective: Sitting up in bed. Denies pain/discomfort.   Objective: Filed Vitals:   12/16/14 0525  BP: 116/85  Pulse: 72  Temp: 97.8 F (36.6 C)  Resp: 18    Intake/Output Summary (Last 24 hours) at 12/16/14 1221 Last data filed at 12/16/14 0900  Gross per 24 hour  Intake 1287.5 ml  Output    300 ml  Net  987.5 ml   Filed Weights   12/15/14 1209 12/15/14 1549  Weight: 74.844 kg (165 lb) 74.844 kg (165 lb)    Exam:   General:  Alert appears well  Cardiovascular: irregularly irregular no MGR no LE edema  Respiratory: normal effort BS distant with fine crackles bases. No wheeze  Abdomen: non-distended +BS non-tender  Musculoskeletal: no clubbing or cyanosis   Data Reviewed: Basic Metabolic Panel:  Recent Labs Lab 12/10/14 0039 12/11/14 0555 12/15/14 1224 12/16/14 0625  NA 130* 132* 134* 134*  K 3.6 3.6 3.6 3.8  CL 90* 98* 96* 100*  CO2 30 27 28 24   GLUCOSE 90 77 86 80  BUN 14 11 8 8   CREATININE 1.96* 1.44* 1.72* 1.37*  CALCIUM 8.5* 8.1* 8.4* 8.0*   Liver Function Tests:  Recent Labs Lab 12/10/14 0039  AST 28  ALT 8*  ALKPHOS 39  BILITOT 0.8  PROT 6.6  ALBUMIN 3.3*   No results for input(s): LIPASE, AMYLASE in the last 168 hours. No results for input(s): AMMONIA in the last 168 hours. CBC:  Recent Labs Lab 12/10/14 0039 12/15/14 1224 12/16/14 0625  WBC 4.8 4.3 3.7*  HGB 12.1 12.3 11.8*  HCT 37.0 37.9 36.7  MCV 93.0 93.8 94.3  PLT 160 144* 123*   Cardiac Enzymes: No results for input(s): CKTOTAL, CKMB, CKMBINDEX, TROPONINI in the last 168 hours. BNP (last 3 results)  Recent Labs  12/10/14 0039  BNP 95.0    ProBNP (last 3 results) No results for input(s): PROBNP in the last 8760 hours.  CBG: No results for input(s): GLUCAP in the last 168 hours.  Recent Results (  from the past 240 hour(s))  Urine culture     Status: None   Collection Time: 12/06/14  2:35 PM  Result Value Ref Range Status   Specimen Description URINE, RANDOM  Final   Special Requests NONE  Final   Colony Count   Final    >=100,000 COLONIES/ML Performed at [facility]    Culture   Final    VANCOMYCIN RESISTANT ENTEROCOCCUS ISOLATED Note: CRITICAL RESULT CALLED TO, READ BACK BY AND VERIFIED WITH:  LISA 6/14 AT 0825 BY [facility] Performed at [facility]    Report Status 12/10/2014 FINAL  Final   Organism ID, Bacteria VANCOMYCIN RESISTANT ENTEROCOCCUS ISOLATED  Final      Susceptibility   Vancomycin resistant enterococcus isolated - MIC*    AMPICILLIN >=32 RESISTANT Resistant     LEVOFLOXACIN >=8 RESISTANT Resistant     NITROFURANTOIN 128 RESISTANT Resistant     TETRACYCLINE <=1 SENSITIVE Sensitive     LINEZOLID 2 SENSITIVE Sensitive     SYNERCID. 1 SENSITIVE Sensitive     VANCOMYCIN 256 RESISTANT Resistant     * VANCOMYCIN RESISTANT ENTEROCOCCUS ISOLATED  Urine culture     Status: None   Collection Time: 12/10/14  2:55 AM  Result Value Ref Range Status   Specimen Description URINE, CLEAN CATCH  Final   Special Requests PT ON ? ANTIBIOTIC  Final   Culture   Final    Multiple bacterial morphotypes present, none predominant. Suggest appropriate recollection if clinically indicated. Performed at [facility]    Report Status 12/12/2014 FINAL  Final  Culture, blood (routine x 2)     Status: None (Preliminary result)   Collection Time: 12/10/14  4:50 AM  Result Value Ref Range Status   Specimen Description BLOOD LEFT ARM  Final   Special Requests BOTTLES DRAWN AEROBIC AND ANAEROBIC 10CC  Final   Culture NO GROWTH 1 DAY  Final   Report Status PENDING  Incomplete  Culture, blood (routine x 2)     Status: None (Preliminary result)   Collection Time: 12/10/14  5:05 AM  Result Value Ref Range Status   Specimen Description BLOOD LEFT HAND  Final   Special Requests BOTTLES DRAWN AEROBIC AND ANAEROBIC 6CC  Final   Culture NO GROWTH 1 DAY  Final   Report Status PENDING  Incomplete     Studies: Dg Chest Port 1 View  12/15/2014   CLINICAL DATA:  Failure to thrive  EXAM: PORTABLE CHEST - 1 VIEW  COMPARISON:  12/10/2014  FINDINGS: Heart is borderline enlarged. Marked tortuosity of the thoracic aorta traversing into the right lower lung zone is stable. Minimal subsegmental  atelectasis at the right lung base. Left lung is clear. No pneumothorax or pleural effusion.  IMPRESSION: No active cardiopulmonary disease.   Electronically Signed   By: Marybelle Killings M.D.   On: 12/15/2014 13:20    Scheduled Meds: . antiseptic oral rinse  7 mL Mouth Rinse BID  . atorvastatin  40 mg Oral QHS  . clopidogrel  75 mg Oral QHS  . diltiazem  30 mg Oral 3 times per day  . heparin  5,000 Units Subcutaneous 3 times per day  . linezolid  600 mg Oral Q12H  . senna-docusate  1 tablet Oral Daily  . traZODone  50 mg Oral QHS   Continuous Infusions: . sodium chloride 75 mL/hr at 12/16/14 1610    Principal Problem:   UTI (urinary tract infection) due to Enterococcus Active Problems:  HTN (hypertension)   PAF (paroxysmal atrial fibrillation)   Hypothyroidism   CKD (chronic kidney disease) stage 3, GFR 30-59 ml/min   Dementia   Chronic respiratory failure   Failure to thrive in adult   UTI (urinary tract infection)   Dehydration    Time spent: 30 minutes    [place] [facility] [facility] Pager [phone]. If 7PM-7AM, please contact night-coverage at [email or web address], password [facility] 12/16/2014, 12:21 PM  LOS: 1 day

## 2014-12-16 NOTE — Clinical Social Work Note (Signed)
Family has not returned CSW's call. CM was able to reach son earlier and he plans to take pt back home and is not interested in placement. Pt d/c today with home health. CSW will sign off.   Benay Pike, [place]

## 2014-12-16 NOTE — Progress Notes (Signed)
PT Cancellation Note  Patient Details Name: Brandy Mcguire MRN: [id number] DOB: Mar 08, 1934   Cancelled Treatment:    Reason Eval/Treat Not Completed: PT screened, no needs identified, will sign off .  Pt was just seen on 12-11-14 for a PT evaluation.  Her cognition is too poor for any kind of rehab.  She would be appropriate for long term nursing home placement but not SNF.  Demetrios Isaacs L  PT 12/16/2014, 8:12 AM [phone]

## 2014-12-16 NOTE — Plan of Care (Signed)
Problem: Phase I Progression Outcomes Goal: Voiding-avoid urinary catheter unless indicated Outcome: Progressing Foley discontinued.

## 2014-12-16 NOTE — Progress Notes (Signed)
UR chart review completed.  

## 2014-12-16 NOTE — Progress Notes (Signed)
PHARMACIST - PHYSICIAN COMMUNICATION DR:   Roderic Palau CONCERNING: Antibiotic IV to Oral Route Change Policy  RECOMMENDATION: This patient is receiving Zyvox by the intravenous route.  Based on criteria approved by the Pharmacy and Therapeutics Committee, the antibiotic(s) is/are being converted to the equivalent oral dose form(s).   DESCRIPTION: These criteria include:  Patient being treated for a respiratory tract infection, urinary tract infection, cellulitis or clostridium difficile associated diarrhea if on metronidazole  The patient is not neutropenic and does not exhibit a GI malabsorption state  The patient is eating (either orally or via tube) and/or has been taking other orally administered medications for a least 24 hours  The patient is improving clinically and has a Tmax < 100.5  If you have questions about this conversion, please contact the Pharmacy Department  [x]   [phone] )  [facility] []   [phone] )  [facility] []   [phone] )  [facility] []   [phone] )  [facility] []   [phone] )  [place], PharmD, BCPS 12/16/2014@10 :37 AM

## 2014-12-16 NOTE — Plan of Care (Addendum)
Discussed with Lanell Matar, NP and Dr. Roderic Palau about the patient care today.  The foley was removed at approx 1400 and as of 1853 she has not urinated.  I voiced to Dr. Roderic Palau that if patient has not voided by 7:00 pm that I would restart the IV and plan for the patient to stay over night.  The MD agreed with this.  I went down to restart the IV but the patient refused to let me attempt the stick.  I voiced previous to karen that I would force fluids for her.  I gave her a total of 360 cc because the patient will not drink a lot at one time.    Spoke with Dr. Roderic Palau approx [phone] I voiced to him that the patient refuses the IV and that I was going to do a bladder scan to see if she was retaining any fluids.  MD stated that if bladder scan shows 350 cc then we scan place a foley and let her go home but less she will have to stay over night and possibly see a urologist in the am.  The patient is very agitated about having to stay and urinate prior to discharge I have attempted on many attempts to explain the importance of urinating after a foley is removed and problems that can occur if the patient is unable to void.  She continues to be agitated about wanting to go home.  Notified Dr. Roderic Palau that with the scan the most I found was 107 cc so the patient will stay over night.  I will also notify the patients son of the changes.  I have gotten the patient up to the bed side commode multiple times because she feels the urge to void but can not.  New orders given and reported to the next nurse.

## 2014-12-16 NOTE — Care Management Note (Signed)
Case Management Note  Patient Details  Name: Brandy Mcguire MRN: [id number] Date of Birth: [birth year]  Subjective/Objective:                  Pt admitted from home with UTI. Pt lives with her son and will return home at discharge. Pts son is CAP aide and lives with pt as well. Pt has hospital bed, home O2, neb machine, w/c, walker for home use. Pt is active with [facility] RN and PT. Per pts chart, pt could not afford po AB prescribed at discharge.  Action/Plan: CM did obtain medication assistance from  company for Zyvox. Pt discharged home today with resumption of [facility] HH RN and PT (per son choice). Did add CSW to [facility] needs. Romualdo Bolk of [facility] is aware and will collect the pts information from the chart. [place] services to start within 48 hours of discharge. No DME needs noted. Pts son and pts nurse aware of discharge arrangements.  Expected Discharge Date:                  Expected Discharge Plan:  [place]  In-House Referral:  NA  Discharge planning Services  CM Consult  Post Acute Care Choice:  Resumption of Svcs/PTA Provider Choice offered to:  Adult Children  DME Arranged:    DME Agency:     HH Arranged:  RN, PT (CSW) [place] Agency:  [facility]  Status of Service:  Completed, signed off  Medicare Important Message Given:    Date Medicare IM Given:    Medicare IM give by:    Date Additional Medicare IM Given:    Additional Medicare Important Message give by:     If discussed at [place] of Stay Meetings, dates discussed:    Additional Comments:  [name], RN 12/16/2014, 1:26 PM

## 2014-12-16 NOTE — Discharge Summary (Signed)
Physician Discharge Summary  Brandy Mcguire KZS:[id number] DOB: [birth year] DOA: 12/15/2014  PCP: [name], MD  Admit date: 12/15/2014 Discharge date: 12/16/2014  Time spent: 40 minutes  Recommendations for Outpatient Follow-up:  1. PCP 5-7 days for evaluation of UTI, failure to thrive, volume status, functional status, ability to void 2. Home health PT, RN and social worker for strength and endurence, skilled observation and assessment.  Discharge Diagnoses:  Principal Problem:   UTI (urinary tract infection) due to Enterococcus Active Problems:   HTN (hypertension)   PAF (paroxysmal atrial fibrillation)   Hypothyroidism   CKD (chronic kidney disease) stage 3, GFR 30-59 ml/min   Urinary retention   Dementia   Chronic respiratory failure   Failure to thrive in adult   UTI (urinary tract infection)   Dehydration   Thrombocytopenia   Discharge Condition: stable  Diet recommendation: heart healthy  Filed Weights   12/15/14 1209 12/15/14 1549  Weight: 74.844 kg (165 lb) 74.844 kg (165 lb)    History of present illness:  Brandy Mcguire is a 81 y.o. female who was recently discharged from the hospital after being treated for an enterococcal urinary tract infection. 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 Patient has dementia and was unable to provide any reliable history. There was no family present at the bedside and they were unavailable by phone. History obtained from the medical record and ER providers. Per record, patient was apparently not doing well after her discharge. Previous discharge summary indicated the patient's mental status was back to baseline. Apparently she was not eating and drinking, as well and becoming increasingly weak. They did not feel that she was improving and therefore brought her to the emergency room. It was unclear whether she had any fever, vomiting, diarrhea or any other complaints. At that  time she did not have any significant complaints. In the emergency room she was noted to be tachycardic on admission with a heart rate of 125. She was otherwise afebrile and blood pressure stable. She was noted to be hypoxic requiring supplemental oxygen. This was apparently a chronic issue as well, and she often intermittently uses oxygen at home. Workup showed that her renal function was near baseline, she did not have any leukocytosis. He urinalysis showed persistent leukocytes in urine and many bacteria. Further history from family indicated that they were unable to fill her Zyvox due to cost and she has not been taking antibiotic since discharge.   Hospital Course:  1. Enterococcal urinary tract infection. Patient has a history of VRE and urinary retention discharged recently with foley in setting of no antibiotic at home due to cost. She received one day Zyvox and pre-approval for oral zyvox upon discharge. She remained afebrile and non-toxic appearing.  2. Dehydration. Likely related to decreased by mouth intake. She needs lots of encouragement to eat/drink at baseline. Home health RN and social work to assess functional status and level of care required.  3. Chronic respiratory failure. Patient uses intermittent oxygen at home. Chest x-ray without acute findings. Oxygen saturation level 98% on 1.5L [place]. 4. Paroxysmal atrial fibrillation. Rate controlled. She has a history of falls and is not on anticoagulation. Continue Plavix. Continue diltiazem for rate control. 5. Hypothyroidism. TSH just above high end of normal. Per chart review synthroid discontinued due to normal TSH. Close OP follow up. synthroid. 6. C daily. Creatinine 1.37 better than baseline. Continue to follow. 7.  Dementia. Stable. 8. Hypertension. Controlled. Continue outpatient regimen. 9. Generalized weakness. Likely related to dehydration and overall failure to thrive. Physical therapy evaluated and recommend long term care.   10. Urinary retention: hospitalization in April failed voiding trial and discharged with foley. Had urology follow up appointment 12/16/14 (today). Will attempt voiding trial. Start flomax.   Procedures:  none  Consultations:  none  Discharge Exam: Filed Vitals:   12/16/14 0525  BP: 116/85  Pulse: 72  Temp: 97.8 F (36.6 C)  Resp: 18    General: well nourished appears well Cardiovascular: irregularly irregular no MGR Respiratory: normal effort BS clear bilaterally no wheeze  Discharge Instructions   Discharge Instructions    Diet - low sodium heart healthy    Complete by:  As directed      Discharge instructions    Complete by:  As directed   Take medication as directed.  Follow up with providers as scheduled     Increase activity slowly    Complete by:  As directed           Current Discharge Medication List    START taking these medications   Details  tamsulosin (FLOMAX) 0.4 MG CAPS capsule Take 1 capsule (0.4 mg total) by mouth daily after supper. Qty: 30 capsule, Refills: 1      CONTINUE these medications which have CHANGED   Details  linezolid (ZYVOX) 600 MG tablet Take 1 tablet (600 mg total) by mouth 2 (two) times daily. Starting 12/17/14 Qty: 12 tablet, Refills: 0      CONTINUE these medications which have NOT CHANGED   Details  atorvastatin (LIPITOR) 40 MG tablet Take 40 mg by mouth at bedtime.    bisacodyl (DULCOLAX) 5 MG EC tablet Take 10 mg by mouth daily as needed for mild constipation or moderate constipation.    clopidogrel (PLAVIX) 75 MG tablet Take 1 tablet (75 mg total) by mouth at bedtime. Resume in 1 week if hemoglobin stable    diltiazem (CARDIZEM) 30 MG tablet Take 1 tablet (30 mg total) by mouth every 8 (eight) hours.    ferrous sulfate 325 (65 FE) MG tablet Take 325 mg by mouth daily with breakfast.    ipratropium-albuterol (DUONEB) 0.5-2.5 (3) MG/3ML SOLN Take 3 mLs by nebulization every 6 (six) hours. Qty: 360 mL     senna-docusate (SENOKOT-S) 8.6-50 MG per tablet Take 1 tablet by mouth daily.    traZODone (DESYREL) 50 MG tablet Take 50 mg by mouth at bedtime.         No Known Allergies Follow-up Information    Follow up with [name], MD.   Specialty:  Family Medicine   Contact information:   [address] [place] [address] [phone]       Follow up with [facility].   Contact information:   [address] [place] [address] [phone]        The results of significant diagnostics from this hospitalization (including imaging, microbiology, ancillary and laboratory) are listed below for reference.    Significant Diagnostic Studies: Dg Chest Port 1 View  12/15/2014   CLINICAL DATA:  Failure to thrive  EXAM: PORTABLE CHEST - 1 VIEW  COMPARISON:  12/10/2014  FINDINGS: Heart is borderline enlarged. Marked tortuosity of the thoracic aorta traversing into the right lower lung zone is stable. Minimal subsegmental atelectasis at the right lung base. Left lung is clear. No pneumothorax or pleural effusion.  IMPRESSION: No active cardiopulmonary disease.  Electronically Signed   By: Marybelle Killings M.D.   On: 12/15/2014 13:20   Dg Chest Port 1 View  12/10/2014   CLINICAL DATA:  Hypotension and altered mental status tonight. Under treatment for urinary tract infection.  EXAM: PORTABLE CHEST - 1 VIEW  COMPARISON:  10/02/2014  FINDINGS: Dilated and tortuous thoracic aorta. Normal heart size and pulmonary vascularity. Lungs are clear and expanded. No blunting of costophrenic angles. No pneumothorax. Similar appearance to previous study.  IMPRESSION: Tortuous and dilated thoracic aorta. Lungs clear. No change since prior study.   Electronically Signed   By: Lucienne Capers M.D.   On: 12/10/2014 02:24    Microbiology: Recent Results (from the past 240 hour(s))  Urine culture     Status: None   Collection Time: 12/06/14  2:35 PM  Result Value Ref Range  Status   Specimen Description URINE, RANDOM  Final   Special Requests NONE  Final   Colony Count   Final    >=100,000 COLONIES/ML Performed at [facility]    Culture   Final    VANCOMYCIN RESISTANT ENTEROCOCCUS ISOLATED Note: CRITICAL RESULT CALLED TO, READ BACK BY AND VERIFIED WITH: LISA 6/14 AT 0825 BY [facility] Performed at [facility]    Report Status 12/10/2014 FINAL  Final   Organism ID, Bacteria VANCOMYCIN RESISTANT ENTEROCOCCUS ISOLATED  Final      Susceptibility   Vancomycin resistant enterococcus isolated - MIC*    AMPICILLIN >=32 RESISTANT Resistant     LEVOFLOXACIN >=8 RESISTANT Resistant     NITROFURANTOIN 128 RESISTANT Resistant     TETRACYCLINE <=1 SENSITIVE Sensitive     LINEZOLID 2 SENSITIVE Sensitive     SYNERCID. 1 SENSITIVE Sensitive     VANCOMYCIN 256 RESISTANT Resistant     * VANCOMYCIN RESISTANT ENTEROCOCCUS ISOLATED  Urine culture     Status: None   Collection Time: 12/10/14  2:55 AM  Result Value Ref Range Status   Specimen Description URINE, CLEAN CATCH  Final   Special Requests PT ON ? ANTIBIOTIC  Final   Culture   Final    Multiple bacterial morphotypes present, none predominant. Suggest appropriate recollection if clinically indicated. Performed at [facility]    Report Status 12/12/2014 FINAL  Final  Culture, blood (routine x 2)     Status: None (Preliminary result)   Collection Time: 12/10/14  4:50 AM  Result Value Ref Range Status   Specimen Description BLOOD LEFT ARM  Final   Special Requests BOTTLES DRAWN AEROBIC AND ANAEROBIC 10CC  Final   Culture NO GROWTH 1 DAY  Final   Report Status PENDING  Incomplete  Culture, blood (routine x 2)     Status: None (Preliminary result)   Collection Time: 12/10/14  5:05 AM  Result Value Ref Range Status   Specimen Description BLOOD LEFT HAND  Final   Special Requests BOTTLES DRAWN AEROBIC AND ANAEROBIC 6CC  Final   Culture NO GROWTH 1 DAY  Final   Report Status PENDING   Incomplete     Labs: Basic Metabolic Panel:  Recent Labs Lab 12/10/14 0039 12/11/14 0555 12/15/14 1224 12/16/14 0625  NA 130* 132* 134* 134*  K 3.6 3.6 3.6 3.8  CL 90* 98* 96* 100*  CO2 30 27 28 24   GLUCOSE 90 77 86 80  BUN 14 11 8 8   CREATININE 1.96* 1.44* 1.72* 1.37*  CALCIUM 8.5* 8.1* 8.4* 8.0*   Liver Function Tests:  Recent Labs Lab 12/10/14 0039  AST 28  ALT 8*  ALKPHOS 39  BILITOT 0.8  PROT 6.6  ALBUMIN 3.3*   No results for input(s): LIPASE, AMYLASE in the last 168 hours. No results for input(s): AMMONIA in the last 168 hours. CBC:  Recent Labs Lab 12/10/14 0039 12/15/14 1224 12/16/14 0625  WBC 4.8 4.3 3.7*  HGB 12.1 12.3 11.8*  HCT 37.0 37.9 36.7  MCV 93.0 93.8 94.3  PLT 160 144* 123*   Cardiac Enzymes: No results for input(s): CKTOTAL, CKMB, CKMBINDEX, TROPONINI in the last 168 hours. BNP: BNP (last 3 results)  Recent Labs  12/10/14 0039  BNP 95.0    ProBNP (last 3 results) No results for input(s): PROBNP in the last 8760 hours.  CBG: No results for input(s): GLUCAP in the last 168 hours.     SignedRadene Gunning  [facility] 12/16/2014, 1:28 PM

## 2014-12-16 NOTE — Clinical Social Work Note (Signed)
CSW attempted to reach all of pt's children and one number was not working and daughter's number does not have voicemail set up. Voicemail left for son, Sonia Side. Will await return call and continue to try to reach family.   Benay Pike, [place]

## 2014-12-17 DIAGNOSIS — K5900 Constipation, unspecified: Secondary | ICD-10-CM

## 2014-12-17 LAB — CBC
HEMATOCRIT: 36.4 % (ref 36.0–46.0)
HEMOGLOBIN: 11.6 g/dL — AB (ref 12.0–15.0)
MCH: 29.9 pg (ref 26.0–34.0)
MCHC: 31.9 g/dL (ref 30.0–36.0)
MCV: 93.8 fL (ref 78.0–100.0)
Platelets: 151 10*3/uL (ref 150–400)
RBC: 3.88 MIL/uL (ref 3.87–5.11)
RDW: 14.8 % (ref 11.5–15.5)
WBC: 3.8 10*3/uL — ABNORMAL LOW (ref 4.0–10.5)

## 2014-12-17 LAB — BASIC METABOLIC PANEL
Anion gap: 9 (ref 5–15)
BUN: 8 mg/dL (ref 6–20)
CO2: 27 mmol/L (ref 22–32)
Calcium: 8.2 mg/dL — ABNORMAL LOW (ref 8.9–10.3)
Chloride: 99 mmol/L — ABNORMAL LOW (ref 101–111)
Creatinine, Ser: 1.4 mg/dL — ABNORMAL HIGH (ref 0.44–1.00)
GFR calc Af Amer: 40 mL/min — ABNORMAL LOW (ref >60)
GFR calc non Af Amer: 35 mL/min — ABNORMAL LOW (ref >60)
GLUCOSE: 99 mg/dL (ref 65–99)
POTASSIUM: 3.2 mmol/L — AB (ref 3.5–5.1)
Sodium: 135 mmol/L (ref 135–145)

## 2014-12-17 MED ORDER — POTASSIUM CHLORIDE CRYS ER 20 MEQ PO TBCR
40.0000 meq | EXTENDED_RELEASE_TABLET | Freq: Once | ORAL | Status: AC
  Administered 2014-12-17: 40 meq via ORAL
  Filled 2014-12-17: qty 2

## 2014-12-17 NOTE — Discharge Summary (Signed)
Physician Discharge Summary  Brandy Mcguire TFT:[id number] DOB: 1933-11-15 DOA: 12/15/2014  PCP: [name], MD  Admit date: 12/15/2014 Discharge date: 12/17/2014  Time spent: 40 minutes  Recommendations for Outpatient Follow-up:  1. PCP 5-7 days for evaluation of UTI, failure to thrive, volume status, functional status, ability to void 2. Home health PT, RN and social worker for strength and endurence, skilled observation and assessment 3. Urology 5 days for evaluation of urinary retention  Discharge Diagnoses:  Principal Problem:   UTI (urinary tract infection) due to Enterococcus Active Problems:   HTN (hypertension)   PAF (paroxysmal atrial fibrillation)   Hypothyroidism   CKD (chronic kidney disease) stage 3, GFR 30-59 ml/min   Urinary retention   Dementia   Chronic respiratory failure   Failure to thrive in adult   UTI (urinary tract infection)   Dehydration   Thrombocytopenia   Discharge Condition: stable  Diet recommendation: heart healthy  Filed Weights   12/15/14 1209 12/15/14 1549  Weight: 74.844 kg (165 lb) 74.844 kg (165 lb)    History of present illness:  Brandy Mcguire is a 81 y.o. female who was recently discharged from the hospital after being treated for an enterococcal urinary tract infection. 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 Patient has dementia and was unable to provide any reliable history. There was no family present at the bedside and they were unavailable by phone. History obtained from the medical record and ER providers. Per record, patient was apparently not doing well after her discharge. Previous discharge summary indicated the patient's mental status was back to baseline. Apparently she was not eating and drinking, as well and becoming increasingly weak. They did not feel that she was improving and therefore brought her to the emergency room. It was unclear whether she had any fever,  vomiting, diarrhea or any other complaints. At that time she did not have any significant complaints. In the emergency room she was noted to be tachycardic on admission with a heart rate of 125. She was otherwise afebrile and blood pressure stable. She was noted to be hypoxic requiring supplemental oxygen. This was apparently a chronic issue as well, and she often intermittently uses oxygen at home. Workup showed that her renal function was near baseline, she did not have any leukocytosis. He urinalysis showed persistent leukocytes in urine and many bacteria. Further history from family indicated that they were unable to fill her Zyvox due to cost and she has not been taking antibiotic since discharge.   Hospital Course:  Enterococcal urinary tract infection. Patient has a history of VRE and urinary retention discharged recently with foley in setting of no antibiotic at home due to cost. She received one day Zyvox and pre-approval for oral zyvox upon discharge. She remained afebrile and non-toxic appearing.   Dehydration. Likely related to decreased by mouth intake. She needs lots of encouragement to eat/drink at baseline. Home health RN and social work to assess functional status and level of care required.   Chronic respiratory failure. Patient uses intermittent oxygen at home. Chest x-ray without acute findings. Oxygen saturation level 98% on 1.5L Brandy Mcguire.  4. Paroxysmal atrial fibrillation. Rate controlled. She has a history of falls and is not on anticoagulation. Continue Plavix. Continue diltiazem for rate control. 5. Hypothyroidism. TSH just above high end of normal. Per chart review synthroid discontinued due to normal TSH. Close OP follow up. synthroid. 6. C daily.  Creatinine 1.37 better than baseline. Continue to follow. 7. Dementia. Stable. 8. Hypertension. Controlled. Continue outpatient regimen. 9. Generalized weakness. Likely related to dehydration and overall failure to thrive. Physical  therapy evaluated and recommend long term care.  10. Urinary retention: hospitalization in April failed voiding trial and discharged with foley. Foley removed and she failed  voiding trial. Started flomax. Re-inserted foley. Will arrange for OP follow up with urology   Procedures:  none  Consultations:  None  Discussed curbside with urology  Discharge Exam: Filed Vitals:   12/17/14 1422  BP: 126/87  Pulse: 84  Temp: 98.5 F (36.9 C)  Resp: 18    General: well nourished appears well Cardiovascular: irregularly irregular no MGR Respiratory: normal effort BS clear bilaterally no wheeze  Discharge Instructions   Discharge Instructions    Diet - low sodium heart healthy    Complete by:  As directed      Discharge instructions    Complete by:  As directed   Take medication as directed.  Follow up with providers as scheduled     Increase activity slowly    Complete by:  As directed           Current Discharge Medication List    START taking these medications   Details  tamsulosin (FLOMAX) 0.4 MG CAPS capsule Take 1 capsule (0.4 mg total) by mouth daily after supper. Qty: 30 capsule, Refills: 1      CONTINUE these medications which have CHANGED   Details  linezolid (ZYVOX) 600 MG tablet Take 1 tablet (600 mg total) by mouth 2 (two) times daily. Starting 12/17/14 Qty: 12 tablet, Refills: 0      CONTINUE these medications which have NOT CHANGED   Details  atorvastatin (LIPITOR) 40 MG tablet Take 40 mg by mouth at bedtime.    bisacodyl (DULCOLAX) 5 MG EC tablet Take 10 mg by mouth daily as needed for mild constipation or moderate constipation.    clopidogrel (PLAVIX) 75 MG tablet Take 1 tablet (75 mg total) by mouth at bedtime. Resume in 1 week if hemoglobin stable    diltiazem (CARDIZEM) 30 MG tablet Take 1 tablet (30 mg total) by mouth every 8 (eight) hours.    ferrous sulfate 325 (65 FE) MG tablet Take 325 mg by mouth daily with breakfast.     ipratropium-albuterol (DUONEB) 0.5-2.5 (3) MG/3ML SOLN Take 3 mLs by nebulization every 6 (six) hours. Qty: 360 mL    senna-docusate (SENOKOT-S) 8.6-50 MG per tablet Take 1 tablet by mouth daily.    traZODone (DESYREL) 50 MG tablet Take 50 mg by mouth at bedtime.         No Known Allergies Follow-up Information    Follow up with [facility].   Contact information:   [address] [place] [address] [phone]       Follow up with [name], MD On 12/25/2014.   Specialty:  Family Medicine   Why:  follow-up appt with dr.golding on wednesday june 29,2016 at 1:30   Contact information:   [address] [place]  [address] [phone]        The results of significant diagnostics from this hospitalization (including imaging, microbiology, ancillary and laboratory) are listed below for reference.    Significant Diagnostic Studies: Dg Chest Port 1 View  12/15/2014   CLINICAL DATA:  Failure to thrive  EXAM: PORTABLE CHEST - 1 VIEW  COMPARISON:  12/10/2014  FINDINGS: Heart is borderline enlarged. Marked  tortuosity of the thoracic aorta traversing into the right lower lung zone is stable. Minimal subsegmental atelectasis at the right lung base. Left lung is clear. No pneumothorax or pleural effusion.  IMPRESSION: No active cardiopulmonary disease.   Electronically Signed   By: Marybelle Killings M.D.   On: 12/15/2014 13:20   Dg Chest Port 1 View  12/10/2014   CLINICAL DATA:  Hypotension and altered mental status tonight. Under treatment for urinary tract infection.  EXAM: PORTABLE CHEST - 1 VIEW  COMPARISON:  10/02/2014  FINDINGS: Dilated and tortuous thoracic aorta. Normal heart size and pulmonary vascularity. Lungs are clear and expanded. No blunting of costophrenic angles. No pneumothorax. Similar appearance to previous study.  IMPRESSION: Tortuous and dilated thoracic aorta. Lungs clear. No change since prior study.   Electronically Signed    By: Lucienne Capers M.D.   On: 12/10/2014 02:24    Microbiology: Recent Results (from the past 240 hour(s))  Urine culture     Status: None   Collection Time: 12/10/14  2:55 AM  Result Value Ref Range Status   Specimen Description URINE, CLEAN CATCH  Final   Special Requests PT ON ? ANTIBIOTIC  Final   Culture   Final    Multiple bacterial morphotypes present, none predominant. Suggest appropriate recollection if clinically indicated. Performed at [facility]    Report Status 12/12/2014 FINAL  Final  Culture, blood (routine x 2)     Status: None   Collection Time: 12/10/14  4:50 AM  Result Value Ref Range Status   Specimen Description BLOOD LEFT ARM  Final   Special Requests BOTTLES DRAWN AEROBIC AND ANAEROBIC 10CC  Final   Culture NO GROWTH 5 DAYS  Final   Report Status 12/16/2014 FINAL  Final  Culture, blood (routine x 2)     Status: None   Collection Time: 12/10/14  5:05 AM  Result Value Ref Range Status   Specimen Description BLOOD LEFT HAND  Final   Special Requests BOTTLES DRAWN AEROBIC AND ANAEROBIC 6CC  Final   Culture NO GROWTH 5 DAYS  Final   Report Status 12/16/2014 FINAL  Final  Urine culture     Status: None (Preliminary result)   Collection Time: 12/15/14  4:32 PM  Result Value Ref Range Status   Specimen Description URINE, CLEAN CATCH  Final   Special Requests NONE  Final   Culture   Final    TOO YOUNG TO READ Performed at [facility]    Report Status PENDING  Incomplete     Labs: Basic Metabolic Panel:  Recent Labs Lab 12/11/14 0555 12/15/14 1224 12/16/14 0625 12/17/14 0620  NA 132* 134* 134* 135  K 3.6 3.6 3.8 3.2*  CL 98* 96* 100* 99*  CO2 27 28 24 27   GLUCOSE 77 86 80 99  BUN 11 8 8 8   CREATININE 1.44* 1.72* 1.37* 1.40*  CALCIUM 8.1* 8.4* 8.0* 8.2*   Liver Function Tests: No results for input(s): AST, ALT, ALKPHOS, BILITOT, PROT, ALBUMIN in the last 168 hours. No results for input(s): LIPASE, AMYLASE in the last 168  hours. No results for input(s): AMMONIA in the last 168 hours. CBC:  Recent Labs Lab 12/15/14 1224 12/16/14 0625 12/17/14 0620  WBC 4.3 3.7* 3.8*  HGB 12.3 11.8* 11.6*  HCT 37.9 36.7 36.4  MCV 93.8 94.3 93.8  PLT 144* 123* 151   Cardiac Enzymes: No results for input(s): CKTOTAL, CKMB, CKMBINDEX, TROPONINI in the last 168 hours. BNP: BNP (last  3 results)  Recent Labs  12/10/14 0039  BNP 95.0    ProBNP (last 3 results) No results for input(s): PROBNP in the last 8760 hours.  CBG: No results for input(s): GLUCAP in the last 168 hours.     SignedRadene Gunning  [facility] 12/17/2014, 3:25 PM

## 2014-12-17 NOTE — Care Management Note (Signed)
Case Management Note  Patient Details  Name: Brandy Mcguire MRN: [id number] Date of Birth: Nov 21, 1933  Subjective/Objective:                    Action/Plan:   Expected Discharge Date:                  Expected Discharge Plan:  [place]  In-House Referral:  NA  Discharge planning Services  CM Consult  Post Acute Care Choice:  Resumption of Svcs/PTA Provider Choice offered to:  Adult Children  DME Arranged:    DME Agency:     HH Arranged:  RN, PT (CSW) [place] Agency:  [place]  Status of Service:  Completed, signed off  Medicare Important Message Given:  No Date Medicare IM Given:    Medicare IM give by:    Date Additional Medicare IM Given:    Additional Medicare Important Message give by:     If discussed at [place] of Stay Meetings, dates discussed:    Additional Comments: Pt did not discharge on 12/16/14 due to pts inability to void without foley. Pt will discharge home today with foley in place. Romualdo Bolk of [facility] is aware of pts discharge and home health services to resume within 48 hours of discharge. Cm did remind bedside RN to give pts son voucher for zyvox so that drug company can pay for medication. Pts son and pts nurse aware of discharge arrangements. Christinia Gully [place], RN 12/17/2014, 3:30 PM

## 2014-12-17 NOTE — Progress Notes (Signed)
Discharge instructions reviewed and discussed with patient's son. Follow up appointments and medications reviewed and discussed with patient's son. All questions were answered and no further questions at this time. Pt in stable condition and in no acute distress at this time. Pt escorted by nurse tech.

## 2014-12-18 LAB — URINE CULTURE

## 2014-12-24 ENCOUNTER — Inpatient Hospital Stay (HOSPITAL_COMMUNITY): Payer: Medicare Other

## 2014-12-24 ENCOUNTER — Encounter (HOSPITAL_COMMUNITY): Payer: Self-pay | Admitting: Emergency Medicine

## 2014-12-24 ENCOUNTER — Emergency Department (HOSPITAL_COMMUNITY): Payer: Medicare Other

## 2014-12-24 ENCOUNTER — Inpatient Hospital Stay (HOSPITAL_COMMUNITY)
Admission: EM | Admit: 2014-12-24 | Discharge: 2014-12-29 | DRG: 698 | Disposition: A | Discharge status: Skilled Nursing Facility | Payer: Medicare Other | Attending: Internal Medicine | Admitting: Internal Medicine

## 2014-12-24 DIAGNOSIS — A419 Sepsis, unspecified organism: Secondary | ICD-10-CM

## 2014-12-24 DIAGNOSIS — Z8673 Personal history of transient ischemic attack (TIA), and cerebral infarction without residual deficits: Secondary | ICD-10-CM | POA: Diagnosis not present

## 2014-12-24 DIAGNOSIS — Z1621 Resistance to vancomycin: Secondary | ICD-10-CM | POA: Diagnosis present

## 2014-12-24 DIAGNOSIS — F0391 Unspecified dementia with behavioral disturbance: Secondary | ICD-10-CM | POA: Diagnosis present

## 2014-12-24 DIAGNOSIS — I480 Paroxysmal atrial fibrillation: Secondary | ICD-10-CM | POA: Diagnosis present

## 2014-12-24 DIAGNOSIS — N183 Chronic kidney disease, stage 3 unspecified: Secondary | ICD-10-CM | POA: Diagnosis present

## 2014-12-24 DIAGNOSIS — N179 Acute kidney failure, unspecified: Secondary | ICD-10-CM | POA: Diagnosis present

## 2014-12-24 DIAGNOSIS — Z7902 Long term (current) use of antithrombotics/antiplatelets: Secondary | ICD-10-CM

## 2014-12-24 DIAGNOSIS — N390 Urinary tract infection, site not specified: Secondary | ICD-10-CM | POA: Diagnosis present

## 2014-12-24 DIAGNOSIS — T8351XA Infection and inflammatory reaction due to indwelling urinary catheter, initial encounter: Principal | ICD-10-CM | POA: Diagnosis present

## 2014-12-24 DIAGNOSIS — Z87891 Personal history of nicotine dependence: Secondary | ICD-10-CM

## 2014-12-24 DIAGNOSIS — R4182 Altered mental status, unspecified: Secondary | ICD-10-CM

## 2014-12-24 DIAGNOSIS — Z9119 Patient's noncompliance with other medical treatment and regimen: Secondary | ICD-10-CM | POA: Diagnosis present

## 2014-12-24 DIAGNOSIS — D649 Anemia, unspecified: Secondary | ICD-10-CM | POA: Diagnosis present

## 2014-12-24 DIAGNOSIS — D72819 Decreased white blood cell count, unspecified: Secondary | ICD-10-CM | POA: Diagnosis present

## 2014-12-24 DIAGNOSIS — R627 Adult failure to thrive: Secondary | ICD-10-CM | POA: Diagnosis present

## 2014-12-24 DIAGNOSIS — Y846 Urinary catheterization as the cause of abnormal reaction of the patient, or of later complication, without mention of misadventure at the time of the procedure: Secondary | ICD-10-CM | POA: Diagnosis present

## 2014-12-24 DIAGNOSIS — E872 Acidosis, unspecified: Secondary | ICD-10-CM | POA: Diagnosis present

## 2014-12-24 DIAGNOSIS — I129 Hypertensive chronic kidney disease with stage 1 through stage 4 chronic kidney disease, or unspecified chronic kidney disease: Secondary | ICD-10-CM | POA: Diagnosis present

## 2014-12-24 DIAGNOSIS — B952 Enterococcus as the cause of diseases classified elsewhere: Secondary | ICD-10-CM | POA: Diagnosis present

## 2014-12-24 DIAGNOSIS — Z825 Family history of asthma and other chronic lower respiratory diseases: Secondary | ICD-10-CM

## 2014-12-24 DIAGNOSIS — Z8249 Family history of ischemic heart disease and other diseases of the circulatory system: Secondary | ICD-10-CM

## 2014-12-24 DIAGNOSIS — I10 Essential (primary) hypertension: Secondary | ICD-10-CM | POA: Diagnosis present

## 2014-12-24 DIAGNOSIS — G9340 Encephalopathy, unspecified: Secondary | ICD-10-CM | POA: Diagnosis not present

## 2014-12-24 DIAGNOSIS — F03918 Unspecified dementia, unspecified severity, with other behavioral disturbance: Secondary | ICD-10-CM | POA: Diagnosis present

## 2014-12-24 DIAGNOSIS — D6959 Other secondary thrombocytopenia: Secondary | ICD-10-CM | POA: Diagnosis present

## 2014-12-24 HISTORY — DX: Unspecified atrial fibrillation: I48.91

## 2014-12-24 LAB — COMPREHENSIVE METABOLIC PANEL
ALT: 11 U/L — ABNORMAL LOW (ref 14–54)
ALT: 11 U/L — ABNORMAL LOW (ref 14–54)
AST: 31 U/L (ref 15–41)
AST: 29 U/L (ref 15–41)
Albumin: 3.7 g/dL (ref 3.5–5.0)
Albumin: 3.7 g/dL (ref 3.5–5.0)
Alkaline Phosphatase: 44 U/L (ref 38–126)
Alkaline Phosphatase: 44 U/L (ref 38–126)
Anion gap: 12 (ref 5–15)
Anion gap: 14 (ref 5–15)
BILIRUBIN TOTAL: 0.8 mg/dL (ref 0.3–1.2)
BUN: 15 mg/dL (ref 6–20)
BUN: 14 mg/dL (ref 6–20)
CALCIUM: 8.7 mg/dL — AB (ref 8.9–10.3)
CO2: 29 mmol/L (ref 22–32)
CO2: 22 mmol/L (ref 22–32)
CREATININE: 2.58 mg/dL — AB (ref 0.44–1.00)
Calcium: 8.3 mg/dL — ABNORMAL LOW (ref 8.9–10.3)
Chloride: 94 mmol/L — ABNORMAL LOW (ref 101–111)
Chloride: 100 mmol/L — ABNORMAL LOW (ref 101–111)
Creatinine, Ser: 2.52 mg/dL — ABNORMAL HIGH (ref 0.44–1.00)
GFR calc non Af Amer: 17 mL/min — ABNORMAL LOW (ref >60)
GFR, EST AFRICAN AMERICAN: 20 mL/min — AB (ref >60)
GFR, EST AFRICAN AMERICAN: 19 mL/min — AB (ref >60)
GFR, EST NON AFRICAN AMERICAN: 17 mL/min — AB (ref >60)
GLUCOSE: 87 mg/dL (ref 65–99)
Glucose, Bld: 100 mg/dL — ABNORMAL HIGH (ref 65–99)
POTASSIUM: 4.2 mmol/L (ref 3.5–5.1)
Potassium: 4 mmol/L (ref 3.5–5.1)
SODIUM: 136 mmol/L (ref 135–145)
Sodium: 135 mmol/L (ref 135–145)
Total Bilirubin: 0.9 mg/dL (ref 0.3–1.2)
Total Protein: 7.4 g/dL (ref 6.5–8.1)
Total Protein: 7.4 g/dL (ref 6.5–8.1)

## 2014-12-24 LAB — CBC WITH DIFFERENTIAL/PLATELET
Basophils Absolute: 0.1 10*3/uL (ref 0.0–0.1)
Basophils Absolute: 0 10*3/uL (ref 0.0–0.1)
Basophils Relative: 1 % (ref 0–1)
Basophils Relative: 0 % (ref 0–1)
EOS ABS: 0.1 10*3/uL (ref 0.0–0.7)
Eosinophils Absolute: 0 10*3/uL (ref 0.0–0.7)
Eosinophils Relative: 2 % (ref 0–5)
Eosinophils Relative: 1 % (ref 0–5)
HCT: 37.3 % (ref 36.0–46.0)
HCT: 37.4 % (ref 36.0–46.0)
HEMOGLOBIN: 11.9 g/dL — AB (ref 12.0–15.0)
Hemoglobin: 11.8 g/dL — ABNORMAL LOW (ref 12.0–15.0)
Lymphocytes Relative: 56 % — ABNORMAL HIGH (ref 12–46)
Lymphocytes Relative: 47 % — ABNORMAL HIGH (ref 12–46)
Lymphs Abs: 2.4 10*3/uL (ref 0.7–4.0)
Lymphs Abs: 2 10*3/uL (ref 0.7–4.0)
MCH: 30 pg (ref 26.0–34.0)
MCH: 30 pg (ref 26.0–34.0)
MCHC: 31.9 g/dL (ref 30.0–36.0)
MCHC: 31.6 g/dL (ref 30.0–36.0)
MCV: 94 fL (ref 78.0–100.0)
MCV: 95.2 fL (ref 78.0–100.0)
MONOS PCT: 6 % (ref 3–12)
Monocytes Absolute: 0.2 10*3/uL (ref 0.1–1.0)
Monocytes Absolute: 0.2 10*3/uL (ref 0.1–1.0)
Monocytes Relative: 5 % (ref 3–12)
NEUTROS ABS: 1.5 10*3/uL — AB (ref 1.7–7.7)
NEUTROS ABS: 2 10*3/uL (ref 1.7–7.7)
NEUTROS PCT: 35 % — AB (ref 43–77)
NEUTROS PCT: 47 % (ref 43–77)
PLATELETS: 129 10*3/uL — AB (ref 150–400)
Platelets: 130 10*3/uL — ABNORMAL LOW (ref 150–400)
RBC: 3.97 MIL/uL (ref 3.87–5.11)
RBC: 3.93 MIL/uL (ref 3.87–5.11)
RDW: 15.7 % — ABNORMAL HIGH (ref 11.5–15.5)
RDW: 15.7 % — AB (ref 11.5–15.5)
WBC: 4.4 10*3/uL (ref 4.0–10.5)
WBC: 4.2 10*3/uL (ref 4.0–10.5)

## 2014-12-24 LAB — APTT: aPTT: 35 seconds (ref 24–37)

## 2014-12-24 LAB — URINALYSIS, ROUTINE W REFLEX MICROSCOPIC
Bilirubin Urine: NEGATIVE
Glucose, UA: NEGATIVE mg/dL
Ketones, ur: NEGATIVE mg/dL
Nitrite: NEGATIVE
Protein, ur: 30 mg/dL — AB
Specific Gravity, Urine: 1.025 (ref 1.005–1.030)
Urobilinogen, UA: 0.2 mg/dL (ref 0.0–1.0)
pH: 5.5 (ref 5.0–8.0)

## 2014-12-24 LAB — LACTIC ACID, PLASMA
LACTIC ACID, VENOUS: 1.8 mmol/L (ref 0.5–2.0)
Lactic Acid, Venous: 1 mmol/L (ref 0.5–2.0)

## 2014-12-24 LAB — CBG MONITORING, ED: Glucose-Capillary: 100 mg/dL — ABNORMAL HIGH (ref 65–99)

## 2014-12-24 LAB — URINE MICROSCOPIC-ADD ON

## 2014-12-24 LAB — MRSA PCR SCREENING: MRSA by PCR: NEGATIVE

## 2014-12-24 LAB — PROCALCITONIN: Procalcitonin: <0.1 ng/mL

## 2014-12-24 LAB — PROTIME-INR
INR: 1.28 (ref 0.00–1.49)
Prothrombin Time: 16.1 seconds — ABNORMAL HIGH (ref 11.6–15.2)

## 2014-12-24 LAB — I-STAT CG4 LACTIC ACID, ED: LACTIC ACID, VENOUS: 2.18 mmol/L — AB (ref 0.5–2.0)

## 2014-12-24 MED ORDER — DILTIAZEM HCL 30 MG PO TABS
30.0000 mg | ORAL_TABLET | Freq: Three times a day (TID) | ORAL | Status: DC
  Administered 2014-12-24 – 2014-12-29 (×12): 30 mg via ORAL
  Filled 2014-12-24 (×14): qty 1

## 2014-12-24 MED ORDER — IPRATROPIUM-ALBUTEROL 0.5-2.5 (3) MG/3ML IN SOLN
3.0000 mL | Freq: Four times a day (QID) | RESPIRATORY_TRACT | Status: DC
  Administered 2014-12-24: 3 mL via RESPIRATORY_TRACT
  Filled 2014-12-24: qty 3

## 2014-12-24 MED ORDER — CHLORHEXIDINE GLUCONATE 0.12 % MT SOLN
15.0000 mL | Freq: Two times a day (BID) | OROMUCOSAL | Status: DC
  Administered 2014-12-24 – 2014-12-25 (×4): 15 mL via OROMUCOSAL
  Filled 2014-12-24 (×3): qty 15

## 2014-12-24 MED ORDER — CEFTRIAXONE SODIUM IN DEXTROSE 20 MG/ML IV SOLN
1.0000 g | INTRAVENOUS | Status: DC
  Filled 2014-12-24 (×2): qty 50

## 2014-12-24 MED ORDER — SENNOSIDES-DOCUSATE SODIUM 8.6-50 MG PO TABS
1.0000 | ORAL_TABLET | Freq: Every day | ORAL | Status: DC
  Administered 2014-12-25 – 2014-12-29 (×5): 1 via ORAL
  Filled 2014-12-24 (×5): qty 1

## 2014-12-24 MED ORDER — SODIUM CHLORIDE 0.9 % IV BOLUS (SEPSIS): 1000.0000 mL | Freq: Once | INTRAVENOUS | Status: DC

## 2014-12-24 MED ORDER — SODIUM CHLORIDE 0.9 % IV BOLUS (SEPSIS)
1000.0000 mL | INTRAVENOUS | Status: AC
  Administered 2014-12-24 (×2): 1000 mL via INTRAVENOUS

## 2014-12-24 MED ORDER — ACETAMINOPHEN 650 MG RE SUPP: 650.0000 mg | Freq: Four times a day (QID) | RECTAL | Status: DC | PRN

## 2014-12-24 MED ORDER — CETYLPYRIDINIUM CHLORIDE 0.05 % MT LIQD
7.0000 mL | Freq: Two times a day (BID) | OROMUCOSAL | Status: DC
  Administered 2014-12-24 – 2014-12-25 (×4): 7 mL via OROMUCOSAL

## 2014-12-24 MED ORDER — VANCOMYCIN HCL 10 G IV SOLR
1500.0000 mg | Freq: Once | INTRAVENOUS | Status: DC
  Administered 2014-12-24: 1500 mg via INTRAVENOUS
  Filled 2014-12-24: qty 1500

## 2014-12-24 MED ORDER — TRAZODONE HCL 50 MG PO TABS
50.0000 mg | ORAL_TABLET | Freq: Every day | ORAL | Status: DC
  Administered 2014-12-25 – 2014-12-28 (×4): 50 mg via ORAL
  Filled 2014-12-24 (×4): qty 1

## 2014-12-24 MED ORDER — ATORVASTATIN CALCIUM 40 MG PO TABS
40.0000 mg | ORAL_TABLET | Freq: Every day | ORAL | Status: DC
  Administered 2014-12-24 – 2014-12-28 (×5): 40 mg via ORAL
  Filled 2014-12-24 (×5): qty 1

## 2014-12-24 MED ORDER — SODIUM CHLORIDE 0.9 % IJ SOLN
3.0000 mL | Freq: Two times a day (BID) | INTRAMUSCULAR | Status: DC
  Administered 2014-12-24 – 2014-12-27 (×6): 3 mL via INTRAVENOUS

## 2014-12-24 MED ORDER — TAMSULOSIN HCL 0.4 MG PO CAPS
0.4000 mg | ORAL_CAPSULE | Freq: Every day | ORAL | Status: DC
  Administered 2014-12-25 – 2014-12-28 (×4): 0.4 mg via ORAL
  Filled 2014-12-24 (×4): qty 1

## 2014-12-24 MED ORDER — ENOXAPARIN SODIUM 30 MG/0.3ML ~~LOC~~ SOLN
30.0000 mg | SUBCUTANEOUS | Status: DC
  Administered 2014-12-24 – 2014-12-26 (×3): 30 mg via SUBCUTANEOUS
  Filled 2014-12-24 (×3): qty 0.3

## 2014-12-24 MED ORDER — PIPERACILLIN-TAZOBACTAM 3.375 G IVPB 30 MIN
3.3750 g | Freq: Once | INTRAVENOUS | Status: AC
  Administered 2014-12-24: 3.375 g via INTRAVENOUS
  Filled 2014-12-24: qty 50

## 2014-12-24 MED ORDER — VANCOMYCIN HCL IN DEXTROSE 1-5 GM/200ML-% IV SOLN: 1000.0000 mg | Freq: Once | INTRAVENOUS | Status: DC

## 2014-12-24 MED ORDER — SODIUM CHLORIDE 0.9 % IV SOLN
INTRAVENOUS | Status: AC
  Administered 2014-12-24: 11:00:00 via INTRAVENOUS

## 2014-12-24 MED ORDER — SODIUM CHLORIDE 0.9 % IV BOLUS (SEPSIS): 500.0000 mL | INTRAVENOUS | Status: AC

## 2014-12-24 MED ORDER — PIPERACILLIN-TAZOBACTAM 3.375 G IVPB
3.3750 g | Freq: Three times a day (TID) | INTRAVENOUS | Status: DC
  Filled 2014-12-24 (×3): qty 50

## 2014-12-24 MED ORDER — SODIUM CHLORIDE 0.9 % IV SOLN
INTRAVENOUS | Status: AC
  Administered 2014-12-24 – 2014-12-25 (×3): via INTRAVENOUS

## 2014-12-24 MED ORDER — CEFTRIAXONE SODIUM 1 G IJ SOLR
1.0000 g | INTRAMUSCULAR | Status: DC
  Administered 2014-12-24 – 2014-12-26 (×3): 1 g via INTRAVENOUS
  Filled 2014-12-24 (×7): qty 10

## 2014-12-24 MED ORDER — LINEZOLID 600 MG/300ML IV SOLN
600.0000 mg | Freq: Two times a day (BID) | INTRAVENOUS | Status: DC
  Administered 2014-12-24 – 2014-12-27 (×7): 600 mg via INTRAVENOUS
  Filled 2014-12-24 (×13): qty 300

## 2014-12-24 MED ORDER — SODIUM CHLORIDE 0.9 % IV BOLUS (SEPSIS)
500.0000 mL | Freq: Once | INTRAVENOUS | Status: AC
  Administered 2014-12-24: 500 mL via INTRAVENOUS

## 2014-12-24 MED ORDER — ONDANSETRON HCL 4 MG PO TABS: 4.0000 mg | ORAL_TABLET | Freq: Four times a day (QID) | ORAL | Status: DC | PRN

## 2014-12-24 MED ORDER — ACETAMINOPHEN 325 MG PO TABS
650.0000 mg | ORAL_TABLET | Freq: Four times a day (QID) | ORAL | Status: DC | PRN
  Administered 2014-12-25: 650 mg via ORAL
  Filled 2014-12-24: qty 2

## 2014-12-24 MED ORDER — CLOPIDOGREL BISULFATE 75 MG PO TABS
75.0000 mg | ORAL_TABLET | Freq: Every day | ORAL | Status: DC
  Administered 2014-12-24 – 2014-12-28 (×5): 75 mg via ORAL
  Filled 2014-12-24 (×5): qty 1

## 2014-12-24 MED ORDER — IPRATROPIUM-ALBUTEROL 0.5-2.5 (3) MG/3ML IN SOLN
3.0000 mL | Freq: Four times a day (QID) | RESPIRATORY_TRACT | Status: DC
  Administered 2014-12-24 – 2014-12-27 (×10): 3 mL via RESPIRATORY_TRACT
  Filled 2014-12-24 (×10): qty 3

## 2014-12-24 MED ORDER — ONDANSETRON HCL 4 MG/2ML IJ SOLN: 4.0000 mg | Freq: Four times a day (QID) | INTRAMUSCULAR | Status: DC | PRN

## 2014-12-24 NOTE — Progress Notes (Signed)
ANTIBIOTIC CONSULT NOTE - INITIAL  Pharmacy Consult for Zosyn Indication: rule out sepsis  No Known Allergies  Patient Measurements: Height: 5\' 6"  (167.6 cm) Weight: 162 lb 7.7 oz (73.7 kg) IBW/kg (Calculated) : 59.3  Vital Signs: Temp: 97.8 F (36.6 C) (06/28 1148) Temp Source: Axillary (06/28 1148) BP: 120/92 mmHg (06/28 1148) Pulse Rate: 84 (06/28 1148) Intake/Output from previous day:   Intake/Output from this shift: Total I/O In: -  Out: 520 [Urine:520]  Labs:  Recent Labs  12/24/14 0917  WBC 4.4  HGB 11.9*  PLT 130*  CREATININE 2.52*   Estimated Creatinine Clearance: 18.6 mL/min (by C-G formula based on Cr of 2.52). No results for input(s): VANCOTROUGH, VANCOPEAK, VANCORANDOM, GENTTROUGH, GENTPEAK, GENTRANDOM, TOBRATROUGH, TOBRAPEAK, TOBRARND, AMIKACINPEAK, AMIKACINTROU, AMIKACIN in the last 72 hours.   Microbiology: Recent Results (from the past 720 hour(s))  Urine culture     Status: None   Collection Time: 12/06/14  2:35 PM  Result Value Ref Range Status   Specimen Description URINE, RANDOM  Final   Special Requests NONE  Final   Colony Count   Final    >=100,000 COLONIES/ML Performed at [facility]    Culture   Final    VANCOMYCIN RESISTANT ENTEROCOCCUS ISOLATED Note: CRITICAL RESULT CALLED TO, READ BACK BY AND VERIFIED WITH: LISA 6/14 AT 0825 BY [facility] Performed at [facility]    Report Status 12/10/2014 FINAL  Final   Organism ID, Bacteria VANCOMYCIN RESISTANT ENTEROCOCCUS ISOLATED  Final      Susceptibility   Vancomycin resistant enterococcus isolated - MIC*    AMPICILLIN >=32 RESISTANT Resistant     LEVOFLOXACIN >=8 RESISTANT Resistant     NITROFURANTOIN 128 RESISTANT Resistant     TETRACYCLINE <=1 SENSITIVE Sensitive     LINEZOLID 2 SENSITIVE Sensitive     SYNERCID. 1 SENSITIVE Sensitive     VANCOMYCIN 256 RESISTANT Resistant     * VANCOMYCIN RESISTANT ENTEROCOCCUS ISOLATED  Urine culture     Status: None   Collection  Time: 12/10/14  2:55 AM  Result Value Ref Range Status   Specimen Description URINE, CLEAN CATCH  Final   Special Requests PT ON ? ANTIBIOTIC  Final   Culture   Final    Multiple bacterial morphotypes present, none predominant. Suggest appropriate recollection if clinically indicated. Performed at [facility]    Report Status 12/12/2014 FINAL  Final  Culture, blood (routine x 2)     Status: None   Collection Time: 12/10/14  4:50 AM  Result Value Ref Range Status   Specimen Description BLOOD LEFT ARM  Final   Special Requests BOTTLES DRAWN AEROBIC AND ANAEROBIC 10CC  Final   Culture NO GROWTH 5 DAYS  Final   Report Status 12/16/2014 FINAL  Final  Culture, blood (routine x 2)     Status: None   Collection Time: 12/10/14  5:05 AM  Result Value Ref Range Status   Specimen Description BLOOD LEFT HAND  Final   Special Requests BOTTLES DRAWN AEROBIC AND ANAEROBIC 6CC  Final   Culture NO GROWTH 5 DAYS  Final   Report Status 12/16/2014 FINAL  Final  Urine culture     Status: None   Collection Time: 12/15/14  4:32 PM  Result Value Ref Range Status   Specimen Description URINE, CLEAN CATCH  Final   Special Requests NONE  Final   Culture   Final    >=100,000 COLONIES/mL YEAST Performed at [facility]  Report Status 12/18/2014 FINAL  Final  Blood Culture (routine x 2)     Status: None (Preliminary result)   Collection Time: 12/24/14  9:20 AM  Result Value Ref Range Status   Specimen Description BLOOD  Final   Special Requests BLOOD  Final   Culture NO GROWTH <12 HOURS  Final   Report Status PENDING  Incomplete  Blood Culture (routine x 2)     Status: None (Preliminary result)   Collection Time: 12/24/14  9:53 AM  Result Value Ref Range Status   Specimen Description BLOOD  Final   Special Requests BLOOD  Final   Culture NO GROWTH <12 HOURS  Final   Report Status PENDING  Incomplete   Medical History: Past Medical History  Diagnosis Date  . Stroke   . CAD  (coronary artery disease)   . Hypertension   . Bite from cat 03/17/2011  . CHF (congestive heart failure)   . PAF (paroxysmal atrial fibrillation)   . Hypothyroidism   . Dementia   . CKD (chronic kidney disease) stage 3, GFR 30-59 ml/min 09/28/2014  . Thoracoabdominal aortic aneurysm 09/29/2014    6.4 x 6.0 cm; previously 4.2 cm.  . Traumatic hematoma of buttock 09/28/2014  . A-fib    Anti-infectives    Start     Dose/Rate Route Frequency Ordered Stop   12/24/14 1800  piperacillin-tazobactam (ZOSYN) IVPB 3.375 g     3.375 g 12.5 mL/hr over 240 Minutes Intravenous Every 8 hours 12/24/14 1146     12/24/14 1300  cefTRIAXone (ROCEPHIN) 1 g in dextrose 5 % 50 mL IVPB - Premix     1 g 100 mL/hr over 30 Minutes Intravenous Every 24 hours 12/24/14 1146     12/24/14 1230  linezolid (ZYVOX) IVPB 600 mg     600 mg 300 mL/hr over 60 Minutes Intravenous Every 12 hours 12/24/14 1139     12/24/14 1030  vancomycin (VANCOCIN) 1,500 mg in sodium chloride 0.9 % 500 mL IVPB  Status:  Discontinued     1,500 mg 250 mL/hr over 120 Minutes Intravenous  Once 12/24/14 0933 12/24/14 1140   12/24/14 0930  piperacillin-tazobactam (ZOSYN) IVPB 3.375 g     3.375 g 100 mL/hr over 30 Minutes Intravenous  Once 12/24/14 0927 12/24/14 1024   12/24/14 0930  vancomycin (VANCOCIN) IVPB 1000 mg/200 mL premix  Status:  Discontinued     1,000 mg 200 mL/hr over 60 Minutes Intravenous  Once 12/24/14 3220 12/24/14 0932     Assessment: 81yo female with PMH of dementia and recent UTI due to VRE.  Pt now readmitted and initiating Zyvox, Rocephin, and Zosyn for suspected sepsis.  SCr is elevated on admission, Normalized clcr ~ 20ml/min (borderline for Zosyn dose adjustment)  Goal of Therapy:  Eradicate infection.  Plan:  Continue Zyvox and Rocephin per MD Zosyn 3.375gm IV q8h F/U SCr Monitor labs, renal fxn, progress, and c/s Duration of therapy per MD Deescalate ABX when appropriate  Hart Robinsons A 12/24/2014,12:01 PM

## 2014-12-24 NOTE — ED Notes (Signed)
Called lab to request blood culture draw.

## 2014-12-24 NOTE — H&P (Signed)
[facility] History and Physical  Brandy Mcguire [name] DOB: 24-Jun-1934 DOA: 12/24/2014  Referring physician: Dr. Tora Perches PCP: [name], MD   Chief Complaint: confusion  HPI: Brandy Mcguire is a 81 y.o. female with past medical history of dementia discharge from the hospital at the beginning of June for UTI due to VRE was noncompliant with her Zyvox, readmitted on 12/15/2014 and DC on 12/17/2058 on home  Zyvox returns to the hospital 12/16/2014 for confusion. There is no family present at bedside, unable to contact them by phone so most of the history is obtained through her medical records. She has an indwelling Foley catheter, and she has minimal urine. Patient is obtunded and only responsive to pain which she opened her eyes.  In the ED: She was found to be hypotensive and was given 2 L of normal saline   Review of Systems:  Constitutional:  No weight loss, night sweats, Fevers, chills, fatigue.  HEENT:  No headaches, Difficulty swallowing,Tooth/dental problems,Sore throat,  No sneezing, itching, ear ache, nasal congestion, post nasal drip,  Cardio-vascular:  No chest pain, Orthopnea, PND, swelling in lower extremities, anasarca, dizziness, palpitations  GI:  No heartburn, indigestion, abdominal pain, nausea, vomiting, diarrhea, change in bowel habits, loss of appetite  Resp:  No shortness of breath with exertion or at rest. No excess mucus, no productive cough, No non-productive cough, No coughing up of blood.No change in color of mucus.No wheezing.No chest wall deformity  Skin:  no rash or lesions.  GU:  no dysuria, change in color of urine, no urgency or frequency. No flank pain.  Musculoskeletal:  No joint pain or swelling. No decreased range of motion. No back pain.  Psych:  No change in mood or affect. No depression or anxiety. No memory loss.   Past Medical History  Diagnosis Date  . Stroke   . CAD (coronary artery disease)   .  Hypertension   . Bite from cat 03/17/2011  . CHF (congestive heart failure)   . PAF (paroxysmal atrial fibrillation)   . Hypothyroidism   . Dementia   . CKD (chronic kidney disease) stage 3, GFR 30-59 ml/min 09/28/2014  . Thoracoabdominal aortic aneurysm 09/29/2014    6.4 x 6.0 cm; previously 4.2 cm.  . Traumatic hematoma of buttock 09/28/2014  . A-fib    Past Surgical History  Procedure Laterality Date  . Stents       kidneys and 2 in heart  . Abdominal hysterectomy     Social History:  reports that she quit smoking about 13 years ago. Her smoking use included Cigarettes. She has a 50 pack-year smoking history. She has never used smokeless tobacco. She reports that she does not drink alcohol or use illicit drugs.  No Known Allergies  Family History  Problem Relation Age of Onset  . Cancer Mother   . Cancer Sister   . COPD Sister   . Heart failure Sister     Prior to Admission medications   Medication Sig Start Date End Date Taking? Authorizing Provider  atorvastatin (LIPITOR) 40 MG tablet Take 40 mg by mouth at bedtime.   Yes Historical Provider, MD  clopidogrel (PLAVIX) 75 MG tablet Take 1 tablet (75 mg total) by mouth at bedtime. Resume in 1 week if hemoglobin stable 10/03/14  Yes Kathie Dike, MD  diltiazem (CARDIZEM) 30 MG tablet Take 1 tablet (30 mg total) by mouth every 8 (eight) hours. 10/03/14  Yes Kathie Dike, MD  ipratropium-albuterol (DUONEB) 0.5-2.5 (  3) MG/3ML SOLN Take 3 mLs by nebulization every 6 (six) hours. 10/03/14  Yes Kathie Dike, MD  linezolid (ZYVOX) 600 MG tablet Take 1 tablet (600 mg total) by mouth 2 (two) times daily. Starting 12/17/14 12/17/14  Yes Lezlie Octave Black, NP  senna-docusate (SENOKOT-S) 8.6-50 MG per tablet Take 1 tablet by mouth daily.   Yes Historical Provider, MD  tamsulosin (FLOMAX) 0.4 MG CAPS capsule Take 1 capsule (0.4 mg total) by mouth daily after supper. 12/16/14  Yes Lezlie Octave Black, NP  traZODone (DESYREL) 50 MG tablet Take 50 mg by mouth at  bedtime.     Yes Historical Provider, MD   Physical Exam: Filed Vitals:   12/24/14 1000 12/24/14 1022 12/24/14 1030 12/24/14 1045  BP: 94/78 94/78 97/84  116/85  Pulse:  91 86   Temp:      TempSrc:      Resp:  28 16 12   Weight:      SpO2:  99% 99%     Wt Readings from Last 3 Encounters:  12/24/14 78.019 kg (172 lb)  12/15/14 74.844 kg (165 lb)  12/11/14 77 kg (169 lb 12.1 oz)    General:  Appears calm and comfortable, but lethargic Eyes: PERRL, normal lids, irises & conjunctiva ENT: grossly normal hearing, lips & tongue Neck: no LAD, masses or thyromegaly Cardiovascular: RRR, no m/r/g. No LE edema. Telemetry: SR, no arrhythmias  Respiratory: CTA bilaterally, no w/r/r. Normal respiratory effort. Abdomen: soft, ntnd Skin: no rash or induration seen on limited exam Musculoskeletal: grossly normal tone BUE/BLE Psychiatric: grossly normal mood and affect, speech fluent and appropriate Neurologic: Open her eyes to pain, moving all 4 extremities without any difficulties T tendon reflexes are 2+ and symmetrical bilaterally.           Labs on Admission:  Basic Metabolic Panel:  Recent Labs Lab 12/24/14 0917  NA 135  K 4.0  CL 94*  CO2 29  GLUCOSE 100*  BUN 15  CREATININE 2.52*  CALCIUM 8.7*   Liver Function Tests:  Recent Labs Lab 12/24/14 0917  AST 31  ALT 11*  ALKPHOS 44  BILITOT 0.9  PROT 7.4  ALBUMIN 3.7   No results for input(s): LIPASE, AMYLASE in the last 168 hours. No results for input(s): AMMONIA in the last 168 hours. CBC:  Recent Labs Lab 12/24/14 0917  WBC 4.4  NEUTROABS 1.5*  HGB 11.9*  HCT 37.3  MCV 94.0  PLT 130*   Cardiac Enzymes: No results for input(s): CKTOTAL, CKMB, CKMBINDEX, TROPONINI in the last 168 hours.  BNP (last 3 results)  Recent Labs  12/10/14 0039  BNP 95.0    ProBNP (last 3 results) No results for input(s): PROBNP in the last 8760 hours.  CBG:  Recent Labs Lab 12/24/14 0902  GLUCAP 100*     Radiological Exams on Admission: Ct Head Wo Contrast  12/24/2014   CLINICAL DATA:  Altered mental status  EXAM: CT HEAD WITHOUT CONTRAST  TECHNIQUE: Contiguous axial images were obtained from the base of the skull through the vertex without intravenous contrast.  COMPARISON:  10/01/2014  FINDINGS: Bony calvarium is intact. 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 No findings to suggest acute hemorrhage, acute infarction or space-occupying mass lesion are noted.  IMPRESSION: Chronic changes without acute abnormality.   Electronically Signed   By: Inez Catalina M.D.   On: 12/24/2014 10:18   Dg Chest Port 1 View  12/24/2014   CLINICAL  DATA:  Seizures.  EXAM: PORTABLE CHEST - 1 VIEW  COMPARISON:  December 15, 2014.  FINDINGS: The heart size and mediastinal contours are within normal limits. Both lungs are clear. No pneumothorax or pleural effusion is noted. The visualized skeletal structures are unremarkable.  IMPRESSION: No acute cardiopulmonary abnormality seen.   Electronically Signed   By: Marijo Conception, M.D.   On: 12/24/2014 09:49    EKG: Independently reviewed. A. fib with RVR with right bundle branch block and left anterior fascicular block  Assessment/Plan Sepsis/ Recurrent UTI due to VRE/  Encephalopathy acute: - She has had a history of noncompliance in the past with a history of dementia I am concerned about compliance of her treatment. - We will start on the sepsis pathway start on IV fluids and will treat her with empiric Ciloxan Rocephin. - Continue trend lactic acid, her blood pressure has initially responded to 1.5 L of normal saline. In order to complete her 30 mix per take have to give an additional liter of normal saline and then continue at 50 mL an hour. - Cultures have already been obtained by the emergency room physician. - I have discussed the case with infectious disease and they recommended to start IV Ciloxan Rocephin. - Her  encephalopathy is likely due to her infectious etiology, she seems to be nonfocal as she is moving all 4 extremities her CT scan of the head does not show any acute abnormalities.  Essential HTN (hypertension) - She is only on diltiazem, her blood pressure stable continue thousand for paroxysmal atrial fibrillation.  Lactic acidosis: - Continue trying likely due to sepsis.  Dementia with behavioral disturbance - Avoid narcotics and benzodiazepines use Haldol for agitation.  Acute kidney injury on CKD (chronic kidney disease) stage 3, GFR 30-59 ml/min: - Baseline creatinine around 1.6 on admission to 2.5. - Is likely will continue IV fluids placed a Foley strict I's and O Spear  Thrombocytopenia: Likely due to sepsis etiology we'll continue to monitor Zyvox can cause pancytopenia.  Paroxysmal atrial fibrillation: Continue diltiazem she seems to be in atrial fibrillation at this point, we'll continue diltiazem orally.  Code Status: presumed full code DVT Prophylaxis: Lovenox Family Communication: no family present, unable to reach family by phone Disposition Plan: pending hospital course  Time spent: 75 min  Charlynne Cousins [facility] Pager [phone]

## 2014-12-24 NOTE — ED Provider Notes (Signed)
CSN: [id number]     Arrival date & time 12/24/14  8182 History  This chart was scribed for Brandy Sorrow, MD by Stephania Fragmin, ED Scribe. This patient was seen in room APA19/APA19 and the patient's care was started at 9:00 AM.    Chief Complaint  Patient presents with  . Altered Mental Status    The history is provided by the EMS personnel. No language interpreter was used.     LEVEL 5 CAVEAT DUE TO PATIENT NONVERBAL/DEMENTIA  HPI Comments: Brandy Mcguire is a 81 y.o. female brought in by ambulance from home, who presents to the Emergency Department complaining of AMS and failure to thrive. Per EMS, patient had a foley catheter in place at home already and CBG of 96 at home. EMS states she had questionable seizure activity when removing from house.   Past Medical History  Diagnosis Date  . Stroke   . CAD (coronary artery disease)   . Hypertension   . Bite from cat 03/17/2011  . CHF (congestive heart failure)   . PAF (paroxysmal atrial fibrillation)   . Hypothyroidism   . Dementia   . CKD (chronic kidney disease) stage 3, GFR 30-59 ml/min 09/28/2014  . Thoracoabdominal aortic aneurysm 09/29/2014    6.4 x 6.0 cm; previously 4.2 cm.  . Traumatic hematoma of buttock 09/28/2014  . A-fib    Past Surgical History  Procedure Laterality Date  . Stents       kidneys and 2 in heart  . Abdominal hysterectomy     Family History  Problem Relation Age of Onset  . Cancer Mother   . Cancer Sister   . COPD Sister   . Heart failure Sister    History  Substance Use Topics  . Smoking status: Former Smoker -- 1.00 packs/day for 50 years    Types: Cigarettes    Quit date: 04/03/2001  . Smokeless tobacco: Never Used  . Alcohol Use: No   OB History    Gravida Para Term Preterm AB TAB SAB Ectopic Multiple Living   7 7 7       5      Review of Systems  Unable to perform ROS: Patient nonverbal   Allergies  Review of patient's allergies indicates no known allergies.  Home Medications    Prior to Admission medications   Medication Sig Start Date End Date Taking? Authorizing Provider  atorvastatin (LIPITOR) 40 MG tablet Take 40 mg by mouth at bedtime.   Yes Historical Provider, MD  clopidogrel (PLAVIX) 75 MG tablet Take 1 tablet (75 mg total) by mouth at bedtime. Resume in 1 week if hemoglobin stable 10/03/14  Yes Kathie Dike, MD  diltiazem (CARDIZEM) 30 MG tablet Take 1 tablet (30 mg total) by mouth every 8 (eight) hours. 10/03/14  Yes Kathie Dike, MD  ipratropium-albuterol (DUONEB) 0.5-2.5 (3) MG/3ML SOLN Take 3 mLs by nebulization every 6 (six) hours. 10/03/14  Yes Kathie Dike, MD  linezolid (ZYVOX) 600 MG tablet Take 1 tablet (600 mg total) by mouth 2 (two) times daily. Starting 12/17/14 12/17/14  Yes Lezlie Octave Black, NP  senna-docusate (SENOKOT-S) 8.6-50 MG per tablet Take 1 tablet by mouth daily.   Yes Historical Provider, MD  tamsulosin (FLOMAX) 0.4 MG CAPS capsule Take 1 capsule (0.4 mg total) by mouth daily after supper. 12/16/14  Yes Lezlie Octave Black, NP  traZODone (DESYREL) 50 MG tablet Take 50 mg by mouth at bedtime.     Yes Historical Provider, MD   BP  97/84 mmHg  Pulse 86  Temp(Src) 97.9 F (36.6 C) (Rectal)  Resp 16  Wt 172 lb (78.019 kg)  SpO2 99% Physical Exam  Constitutional: She is oriented to person, place, and time. She appears well-developed and well-nourished. No distress.  Pt opened her eyes to being talked to once, and verbalized "no" when asking if she had pain before becoming nonverbal again. Eyes rolled back in head, but resists eye opening. Otherwise nonverbal.  HENT:  Head: Normocephalic and atraumatic.  Dry mucous membranes.  Eyes: Conjunctivae and EOM are normal.  Neck: Neck supple. No tracheal deviation present.  Cardiovascular: An irregular rhythm present. Tachycardia present.   Somewhat irregular, midly tachycardic, heart sounds distant. Cap refill about 3 seconds in great toes bilaterally. No ankle swelling.   Pulmonary/Chest: Effort  normal. No respiratory distress.  Bilateral crackles  Abdominal:  Abdomen somewhat distended, but non-tender.  Musculoskeletal: Normal range of motion.  Neurological: She is alert and oriented to person, place, and time.  Skin: Skin is warm and dry.  Bruising throughout skin, predominantly on arms but some throughout abdomen.  Psychiatric: She has a normal mood and affect. Her behavior is normal.  Nursing note and vitals reviewed.   ED Course  Procedures (including critical care time)  DIAGNOSTIC STUDIES: Oxygen Saturation is 90% on RA, adequate by my interpretation.     Labs Review Labs Reviewed  URINALYSIS, ROUTINE W REFLEX MICROSCOPIC (NOT AT [facility]) - Abnormal; Notable for the following:    APPearance HAZY (*)    Hgb urine dipstick LARGE (*)    Protein, ur 30 (*)    Leukocytes, UA SMALL (*)    All other components within normal limits  URINE MICROSCOPIC-ADD ON - Abnormal; Notable for the following:    Squamous Epithelial / LPF MANY (*)    Bacteria, UA MANY (*)    All other components within normal limits  COMPREHENSIVE METABOLIC PANEL - Abnormal; Notable for the following:    Chloride 94 (*)    Glucose, Bld 100 (*)    Creatinine, Ser 2.52 (*)    Calcium 8.7 (*)    ALT 11 (*)    GFR calc non Af Amer 17 (*)    GFR calc Af Amer 20 (*)    All other components within normal limits  CBC WITH DIFFERENTIAL/PLATELET - Abnormal; Notable for the following:    Hemoglobin 11.9 (*)    RDW 15.7 (*)    Platelets 130 (*)    Neutrophils Relative % 35 (*)    Neutro Abs 1.5 (*)    Lymphocytes Relative 56 (*)    All other components within normal limits  CBG MONITORING, ED - Abnormal; Notable for the following:    Glucose-Capillary 100 (*)    All other components within normal limits  I-STAT CG4 LACTIC ACID, ED - Abnormal; Notable for the following:    Lactic Acid, Venous 2.18 (*)    All other components within normal limits  URINE CULTURE  CULTURE, BLOOD (ROUTINE X 2)  CULTURE,  BLOOD (ROUTINE X 2)    Imaging Review Ct Head Wo Contrast  12/24/2014   CLINICAL DATA:  Altered mental status  EXAM: CT HEAD WITHOUT CONTRAST  TECHNIQUE: Contiguous axial images were obtained from the base of the skull through the vertex without intravenous contrast.  COMPARISON:  10/01/2014  FINDINGS: Bony calvarium is intact. 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 No findings to suggest acute hemorrhage, acute infarction  or space-occupying mass lesion are noted.  IMPRESSION: Chronic changes without acute abnormality.   Electronically Signed   By: Inez Catalina M.D.   On: 12/24/2014 10:18   Dg Chest Port 1 View  12/24/2014   CLINICAL DATA:  Seizures.  EXAM: PORTABLE CHEST - 1 VIEW  COMPARISON:  December 15, 2014.  FINDINGS: The heart size and mediastinal contours are within normal limits. Both lungs are clear. No pneumothorax or pleural effusion is noted. The visualized skeletal structures are unremarkable.  IMPRESSION: No acute cardiopulmonary abnormality seen.   Electronically Signed   By: Marijo Conception, M.D.   On: 12/24/2014 09:49     EKG Interpretation   Date/Time:  Tuesday December 24 2014 08:57:02 EDT Ventricular Rate:  98 PR Interval:    QRS Duration: 154 QT Interval:  418 QTC Calculation: 533 R Axis:   -85 Text Interpretation:  Atrial fibrillation Right bundle branch block Left  anterior fascicular block  Bifascicular block Possible Lateral  infarct , age undetermined Cannot rule out Inferior infarct (masked by  fascicular block?) , age undetermined Abnormal ECG Artifact Reconfirmed by  Mathhew Buysse  MD, Meila Berke [phone]) on 12/24/2014 9:35:53 AM      CRITICAL CARE Performed by: Brandy Mcguire Total critical care time: 30 Critical care time was exclusive of separately billable procedures and treating other patients. Critical care was necessary to treat or prevent imminent or life-threatening deterioration. Critical care was time spent personally by  me on the following activities: development of treatment plan with patient and/or surrogate as well as nursing, discussions with consultants, evaluation of patient's response to treatment, examination of patient, obtaining history from patient or surrogate, ordering and performing treatments and interventions, ordering and review of laboratory studies, ordering and review of radiographic studies, pulse oximetry and re-evaluation of patient's condition.   MDM   Final diagnoses:  Altered mental status  Sepsis, due to unspecified organism    Patient arrived with septic picture altered mental status. Hypotensive tachycardic. Septic protocol started antibody started. Patient given fluid bolus blood pressure now up to 97 and heart rate down to 86. Lab workup consistent with urinary tract infection this probably represents urosepsis. Discussed with hospitalist will be admitted to step down unit. Patient did receive protocol antibody. Head CT was negative chest x-ray was negative no evidence of pneumonia. No evidence of any acute intracranial abnormality.   I personally performed the services described in this documentation, which was scribed in my presence. The recorded information has been reviewed and is accurate.      Brandy Sorrow, MD 12/24/14 1047

## 2014-12-24 NOTE — ED Notes (Signed)
Pt reports generalized weakness, equal grips, 30mm pupils, foley is clear, hx uti, afib, chronic resp problems.  Witnessed seizure upon EMS arrival lasting 20 seconds.  cbg 96, bruising to right arm, afib on monitor. 94/53, 80-100 hr.

## 2014-12-24 NOTE — ED Notes (Signed)
Attempted report 

## 2014-12-25 DIAGNOSIS — D649 Anemia, unspecified: Secondary | ICD-10-CM

## 2014-12-25 DIAGNOSIS — D72819 Decreased white blood cell count, unspecified: Secondary | ICD-10-CM

## 2014-12-25 LAB — BASIC METABOLIC PANEL
ANION GAP: 5 (ref 5–15)
BUN: 11 mg/dL (ref 6–20)
CHLORIDE: 107 mmol/L (ref 101–111)
CO2: 25 mmol/L (ref 22–32)
Calcium: 6.7 mg/dL — ABNORMAL LOW (ref 8.9–10.3)
Creatinine, Ser: 1.81 mg/dL — ABNORMAL HIGH (ref 0.44–1.00)
GFR, EST AFRICAN AMERICAN: 30 mL/min — AB (ref >60)
GFR, EST NON AFRICAN AMERICAN: 25 mL/min — AB (ref >60)
Glucose, Bld: 72 mg/dL (ref 65–99)
POTASSIUM: 4.5 mmol/L (ref 3.5–5.1)
SODIUM: 137 mmol/L (ref 135–145)

## 2014-12-25 LAB — CBC
HEMATOCRIT: 28.7 % — AB (ref 36.0–46.0)
HEMOGLOBIN: 9.1 g/dL — AB (ref 12.0–15.0)
MCH: 30.5 pg (ref 26.0–34.0)
MCHC: 31.7 g/dL (ref 30.0–36.0)
MCV: 96.3 fL (ref 78.0–100.0)
Platelets: 109 10*3/uL — ABNORMAL LOW (ref 150–400)
RBC: 2.98 MIL/uL — AB (ref 3.87–5.11)
RDW: 15.7 % — ABNORMAL HIGH (ref 11.5–15.5)
WBC: 2.6 10*3/uL — AB (ref 4.0–10.5)

## 2014-12-25 MED ORDER — SODIUM CHLORIDE 0.9 % IV BOLUS (SEPSIS)
1000.0000 mL | Freq: Once | INTRAVENOUS | Status: AC
  Administered 2014-12-25: 1000 mL via INTRAVENOUS

## 2014-12-25 NOTE — Progress Notes (Signed)
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 - She is only put out about 500 mL of urine but her creatinine has greatly improved. - 1 concern is that she has developed mild leukopenia and hypothermia, but her mentation is improved. - I have discussed with the family the risk and benefits at her CODE STATUS there can have a family meeting and discuss her CODE STATUS. - I will continue IV fluids, continue her on a bear hugger. - Blood cultures continue to be negative.  Acute kidney injury/ CKD (chronic kidney disease) stage 3, GFR 30-59 ml/min - Likely due to sepsis continues to improve with IV hydration. - She has had minimal urine output, I will increase IV fluids.  Leukopenia: - Unsure of other diseases due to Ciloxan due to his overwhelming sepsis, she is mildly hypothermic.  Encephalopathy acute: - No improved compare 12/16/2014. - Likely due to infectious etiology.  Lactic acidosis: - Due to sepsis resolved with aggressive resuscitation.  Normocytic anemia: - Is a mild drop in hemoglobin of 2.0 she was hemoconcentrated. - Compared to previous hemoglobin in previous admissions and has remained stable.  Dementia with behavioral disturbance - Use Haldol for agitation avoid benzodiazepines or narcotics.  Paroxysmal atrial fibrillation: Rate control, not a candidate for antegrade ablation due to GI bleed.  HTN (hypertension)  Thrombocytopenia: - Patient was continued to decrease slowly we'll continue to monitor.  Code Status: full Family Communication: daughter  Disposition Plan: inpatient   Consultants:  none  Procedures:  CXR  Antibiotics:  zyvox and rocpehin 6.28.2016  HPI/Subjective: She is more alert today she knows she is at the hospital.  Objective: Filed Vitals:   12/25/14  0512 12/25/14 0530 12/25/14 0600 12/25/14 0745  BP: 113/72 117/73 113/79   Pulse:   97   Temp:      TempSrc:      Resp:  13 28   Height:      Weight:      SpO2:   100% 97%    Intake/Output Summary (Last 24 hours) at 12/25/14 0804 Last data filed at 12/25/14 0515  Gross per 24 hour  Intake 3208.67 ml  Output    300 ml  Net 2908.67 ml   Filed Weights   12/24/14 0940 12/24/14 1148 12/25/14 0500  Weight: 78.019 kg (172 lb) 73.7 kg (162 lb 7.7 oz) 77.8 kg (171 lb 8.3 oz)    Exam:  General: Alert, awake, oriented x1, in no acute distress.  HEENT: No bruits, no goiter.  Heart: Regular rate and rhythm. Lungs: Good air movement, clear Abdomen: Soft, nontender, nondistended, positive bowel sounds.  Neuro: Grossly intact, nonfocal.   Data Reviewed: Basic Metabolic Panel:  Recent Labs Lab 12/24/14 0917 12/24/14 1246 12/25/14 0436  NA 135 136 137  K 4.0 4.2 4.5  CL 94* 100* 107  CO2 29 22 25   GLUCOSE 100* 87 72  BUN 15 14 11   CREATININE 2.52* 2.58* 1.81*  CALCIUM 8.7* 8.3* 6.7*   Liver Function Tests:  Recent Labs Lab 12/24/14 0917 12/24/14 1246  AST 31 29  ALT 11* 11*  ALKPHOS 44 44  BILITOT 0.9 0.8  PROT 7.4 7.4  ALBUMIN 3.7 3.7   No results for input(s): LIPASE, AMYLASE in the last 168 hours. No results for input(s): AMMONIA in the last 168 hours. CBC:  Recent  Labs Lab 12/24/14 0917 12/24/14 1246 12/25/14 0436  WBC 4.4 4.2 2.6*  NEUTROABS 1.5* 2.0  --   HGB 11.9* 11.8* 9.1*  HCT 37.3 37.4 28.7*  MCV 94.0 95.2 96.3  PLT 130* 129* 109*   Cardiac Enzymes: No results for input(s): CKTOTAL, CKMB, CKMBINDEX, TROPONINI in the last 168 hours. BNP (last 3 results)  Recent Labs  12/10/14 0039  BNP 95.0    ProBNP (last 3 results) No results for input(s): PROBNP in the last 8760 hours.  CBG:  Recent Labs Lab 12/24/14 0902  GLUCAP 100*    Recent Results (from the past 240 hour(s))  Urine culture     Status: None   Collection Time:  12/15/14  4:32 PM  Result Value Ref Range Status   Specimen Description URINE, CLEAN CATCH  Final   Special Requests NONE  Final   Culture   Final    >=100,000 COLONIES/mL YEAST Performed at [facility]    Report Status 12/18/2014 FINAL  Final  Blood Culture (routine x 2)     Status: None (Preliminary result)   Collection Time: 12/24/14  9:20 AM  Result Value Ref Range Status   Specimen Description BLOOD  Final   Special Requests BLOOD  Final   Culture NO GROWTH <12 HOURS  Final   Report Status PENDING  Incomplete  Blood Culture (routine x 2)     Status: None (Preliminary result)   Collection Time: 12/24/14  9:53 AM  Result Value Ref Range Status   Specimen Description BLOOD  Final   Special Requests BLOOD  Final   Culture NO GROWTH <12 HOURS  Final   Report Status PENDING  Incomplete  MRSA PCR Screening     Status: None   Collection Time: 12/24/14 11:45 AM  Result Value Ref Range Status   MRSA by PCR NEGATIVE NEGATIVE Final    Comment:        The GeneXpert MRSA Assay (FDA approved for NASAL specimens only), is one component of a comprehensive MRSA colonization surveillance program. It is not intended to diagnose MRSA infection nor to guide or monitor treatment for MRSA infections.      Studies: Ct Head Wo Contrast  12/24/2014   CLINICAL DATA:  Altered mental status  EXAM: CT HEAD WITHOUT CONTRAST  TECHNIQUE: Contiguous axial images were obtained from the base of the skull through the vertex without intravenous contrast.  COMPARISON:  10/01/2014  FINDINGS: Bony calvarium is intact. 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 No findings to suggest acute hemorrhage, acute infarction or space-occupying mass lesion are noted.  IMPRESSION: Chronic changes without acute abnormality.   Electronically Signed   By: Inez Catalina M.D.   On: 12/24/2014 10:18   Dg Chest Port 1 View  12/24/2014   CLINICAL DATA:  Seizures.  EXAM: PORTABLE  CHEST - 1 VIEW  COMPARISON:  December 15, 2014.  FINDINGS: The heart size and mediastinal contours are within normal limits. Both lungs are clear. No pneumothorax or pleural effusion is noted. The visualized skeletal structures are unremarkable.  IMPRESSION: No acute cardiopulmonary abnormality seen.   Electronically Signed   By: Marijo Conception, M.D.   On: 12/24/2014 09:49   Dg Chest Port 1v Same Day  12/24/2014   CLINICAL DATA:  Sepsis and worsening shortness of Breath  EXAM: PORTABLE CHEST - 1 VIEW SAME DAY  COMPARISON:  12/24/2014  FINDINGS: Cardiac shadow is stable. Tortuosity of the  thoracic aorta is again seen with some calcification. Patient rotation to the right accentuates the mediastinal markings. No focal infiltrate or sizable effusion is seen.  IMPRESSION: No acute abnormality noted.   Electronically Signed   By: Inez Catalina M.D.   On: 12/24/2014 12:58    Scheduled Meds: . antiseptic oral rinse  7 mL Mouth Rinse q12n4p  . atorvastatin  40 mg Oral QHS  . cefTRIAXone (ROCEPHIN)  IV  1 g Intravenous Q24H  . chlorhexidine  15 mL Mouth Rinse BID  . clopidogrel  75 mg Oral QHS  . diltiazem  30 mg Oral 3 times per day  . enoxaparin (LOVENOX) injection  30 mg Subcutaneous Q24H  . ipratropium-albuterol  3 mL Nebulization Q6H  . linezolid  600 mg Intravenous Q12H  . senna-docusate  1 tablet Oral Daily  . sodium chloride  1,000 mL Intravenous Once  . sodium chloride  1,000 mL Intravenous Once  . sodium chloride  3 mL Intravenous Q12H  . tamsulosin  0.4 mg Oral QPC supper  . traZODone  50 mg Oral QHS   Continuous Infusions: . sodium chloride 50 mL/hr at 12/25/14 0246    Time Spent: 35 min   Charlynne Cousins  [facility] Pager [phone]. If 7PM-7AM, please contact night-coverage at [email or web address], password [facility] 12/25/2014, 8:04 AM  LOS: 1 day

## 2014-12-25 NOTE — Care Management Note (Signed)
Case Management Note  Patient Details  Name: Brandy Mcguire MRN: [id number] Date of Birth: October 15, 1933   Expected Discharge Date:  12/28/14               Expected Discharge Plan:  [place]  In-House Referral:  Clinical Social Work  Discharge planning Services  CM Consult  Post Acute Care Choice:  NA Choice offered to:  NA  DME Arranged:    DME Agency:     HH Arranged:    [place] Agency:     Status of Service:  In process, will continue to follow  Medicare Important Message Given:    Date Medicare IM Given:    Medicare IM give by:    Date Additional Medicare IM Given:    Additional Medicare Important Message give by:     If discussed at [place] of Stay Meetings, dates discussed:    Additional Comments: Patient is from home and requires assistance with ADL's. Pt's son works as her aid. Pt is active with [facility] for [facility] services, [facility] is aware of admission. Anticipate need for SNF at DC. CSW has seen patient and patient's daughter, family is agreeable to SNF at DC. Will cont to follow for DC planning.  Sherald Barge, RN 12/25/2014, 12:22 PM

## 2014-12-25 NOTE — Clinical Social Work Note (Signed)
Clinical Social Work Assessment  Patient Details  Name: Brandy Mcguire MRN: [id number] Date of Birth: Jan 27, 1934  Date of referral:  12/25/14               Reason for consult:  Facility Placement                Permission sought to share information with:    Permission granted to share information::     Name::        Agency::     Relationship::     Contact Information:     Housing/Transportation Living arrangements for the past 2 months:  Mobile Home Source of Information:  Adult Children (Patient's daughter, Junie Bame, provided CSW with the history. ) Patient Interpreter Needed:  None Criminal Activity/Legal Involvement Pertinent to Current Situation/Hospitalization:  No - Comment as needed Significant Relationships:  Adult Children Lives with:  Adult Children (Patient lives with her son, Jolicia Delira, who is her CAP aid. ) Do you feel safe going back to the place where you live?  Yes Need for family participation in patient care:  Yes (Comment)  Care giving concerns:  Patient's son, Sumie Remsen, has been her paid CAP aid for years.  Patient's care in the home may be getting too difficult to manage in the home at this point.    Social Worker assessment / plan: CSW met with patient's daughter, Junie Bame, who was at bedside. She advised that patient's doctor had told her that patient was never going to be able to go back home and that she may not make it to a nursing home.  Ms. Celesta Aver stated she is "slowly losing a little bit of her for the last few years because of her dementia." Ms. Celesta Aver stated that she realizes that patient has to go to a SNF.  She advised that patient had previously been at [facility] for rehab and she had desired for patient to remain there, but patient wanted to go home.  Ms. Celesta Aver advised that patient's doctor had also, discussed patient being a DNR, but that she needed to speak to two more of her brothers before that decision was made regarding  code status.  CSW provided patient with a SNF list.  Ms. Celesta Aver stated that she would prefer if patient remained in [place] at [facility] and [facility].  She advised that [facility] would be her first choice.  She advised that patient could "do everything" two months ago and her only issue was her dementia.  CSW advised that patient is now unable to walk and her son was performing her ADL's for her.    Employment status:  Retired Forensic scientist:  Information systems manager, Medicaid In [place] PT Recommendations:    Information / Referral to community resources:  [place]  Patient/Family's Response to care:  Family is agreeable to go to SNF.  Patient/Family's Understanding of and Emotional Response to Diagnosis, Current Treatment, and Prognosis:  Patient's family understands the seriousness of patient's diagnosis, treatment and prognosis.    Emotional Assessment Appearance:  Developmentally appropriate Attitude/Demeanor/Rapport:  Unable to Assess Affect (typically observed):  Unable to Assess Orientation:  Oriented to Self Alcohol / Substance use:  Not Applicable Psych involvement (Current and /or in the community):  No (Comment)  Discharge Needs  Concerns to be addressed:  Discharge Planning Concerns Readmission within the last 30 days:  No Current discharge risk:  None Barriers to Discharge:  No Barriers Identified   [name], LCSW 12/25/2014, 12:29  PM [phone]

## 2014-12-26 LAB — CBC
HEMATOCRIT: 28.9 % — AB (ref 36.0–46.0)
HEMOGLOBIN: 9 g/dL — AB (ref 12.0–15.0)
MCH: 29.8 pg (ref 26.0–34.0)
MCHC: 31.1 g/dL (ref 30.0–36.0)
MCV: 95.7 fL (ref 78.0–100.0)
Platelets: 104 10*3/uL — ABNORMAL LOW (ref 150–400)
RBC: 3.02 MIL/uL — ABNORMAL LOW (ref 3.87–5.11)
RDW: 15.7 % — ABNORMAL HIGH (ref 11.5–15.5)
WBC: 2.6 10*3/uL — ABNORMAL LOW (ref 4.0–10.5)

## 2014-12-26 LAB — BASIC METABOLIC PANEL
Anion gap: 6 (ref 5–15)
BUN: 9 mg/dL (ref 6–20)
CHLORIDE: 104 mmol/L (ref 101–111)
CO2: 23 mmol/L (ref 22–32)
Calcium: 7.4 mg/dL — ABNORMAL LOW (ref 8.9–10.3)
Creatinine, Ser: 1.63 mg/dL — ABNORMAL HIGH (ref 0.44–1.00)
GFR calc Af Amer: 33 mL/min — ABNORMAL LOW (ref >60)
GFR calc non Af Amer: 29 mL/min — ABNORMAL LOW (ref >60)
GLUCOSE: 80 mg/dL (ref 65–99)
Potassium: 3.5 mmol/L (ref 3.5–5.1)
Sodium: 133 mmol/L — ABNORMAL LOW (ref 135–145)

## 2014-12-26 LAB — URINE CULTURE

## 2014-12-26 NOTE — Evaluation (Addendum)
Physical Therapy Evaluation Patient Details Name: Brandy Mcguire MRN: [id number] DOB: 11/21/1933 Today's Date: 12/26/2014   History of Present Illness  Brandy Mcguire is a 82 y.o. female with past medical history of dementia discharge from the hospital at the beginning of June for UTI due to VRE was noncompliant with her Zyvox, readmitted on 12/15/2014 and DC on 12/17/2058 on home Zyvox returns to the hospital 12/16/2014 for confusion  Clinical Impression  Pt is able to stand demonstrating 3-3-/5 LE strength but refuses to try to walk or to try to transfer into the chair.  Pt is able to come from supine to sit as well as sit to supine with mod I and sit to stand with supervision therefore she should be able to attempt ambulation but refuses.  Pt has low rehab potential.     Follow Up Recommendations No PT follow up    Equipment Recommendations    none   Recommendations for Other Services   none    Precautions / Restrictions Precautions Precautions: Fall Restrictions Weight Bearing Restrictions: No      Mobility  Bed Mobility Overal bed mobility: Modified Independent                Transfers    Pt refuses             General transfer comment: un able to assess.  Pt states she does not want to get up and will not try to transger or walk.  Pt would come sit to stand with supervision assist only but would only stand for a second prior to sitting down.    Ambulation/Gait             General Gait Details: refuses to try to walk            Pertinent Vitals/Pain Pain Assessment: No/denies pain    Home Living Family/patient expects to be discharged to:: Private residence Living Arrangements: Children Available Help at Discharge: Family;Available 24 hours/day Type of Home: House       Home Layout: One level Home Equipment: [place] - 2 wheels;Wheelchair - manual Additional Comments: pt is unable to give full hx    Prior Function       Comments: pt spent about a month at [facility] from April to May and from the MD notes she was walking a small amount and uses a w/c. Family states pt is mainly bedbound at this time.         Extremity/Trunk Assessment               Lower Extremity Assessment: Generalized weakness         Communication   Communication: No difficulties  Cognition Arousal/Alertness: Awake/alert Behavior During Therapy: WFL for tasks assessed/performed Overall Cognitive Status: History of cognitive impairments - at baseline       Memory: Decreased short-term memory                       Assessment/Plan    PT Assessment Patent does not need any further PT services  PT Diagnosis     PT Problem List    PT Treatment Interventions                   End of Session Equipment Utilized During Treatment: Gait belt Activity Tolerance: Other (comment) (due to refusal to participate) Patient left: in bed;with call bell/phone within reach;with family/visitor present  Time: 2224-1146 PT Time Calculation (min) (ACUTE ONLY): 21 min   Charges:   PT Evaluation $Initial PT Evaluation Tier I: 1 Procedure     PT G CodesRayetta Humphrey, PT CLT [phone] 12/26/2014, 2:22 PM

## 2014-12-26 NOTE — Progress Notes (Signed)
Report called to C.Morris,RN. Patient transferred to 306 in stable condition via bed.

## 2014-12-26 NOTE — Clinical Documentation Improvement (Signed)
MD's, NP's, and PA's  Per EMS note "patient had foley catheter  in place at home" if appropriate for this admission please provide information pertaining too, a cause and effect relationship if any, between indwelling foley catheter and _recurrent VRE UTI.  Thank you   Are the conditions: ? VRE UTI due to or associated with indwelling foley catheter ? VRE UTI not due or associated with indwelling foley catheter ? Other (please specify)  ? Unable to determine ? Unknown    Risk Factors: recurrent VRE UTI, dementia, indwelling foley catheter , Sign & Symptoms: tachycardia, hypotension, hypothermic Diagnostics: urine blood cx's pending Treatment: IV Ciloxain, Rocephin  Thank you, Ree Kida ,RN Clinical Documentation Specialist:  [place] Information Management

## 2014-12-26 NOTE — Progress Notes (Signed)
BP and heart rate low Dr Lavell Islam in to evaluate Pt is asymptomatic Pt is alert without chest pain.

## 2014-12-26 NOTE — Clinical Social Work Placement (Signed)
   CLINICAL SOCIAL WORK PLACEMENT  NOTE  Date:  12/26/2014  Patient Details  Name: Brandy Mcguire MRN: [id number] Date of Birth: 1934/01/12  Clinical Social Work is seeking post-discharge placement for this patient at the [place] level of care (*CSW will initial, date and re-position this form in  chart as items are completed):  Yes   Patient/family provided with [facility] Work Department's list of facilities offering this level of care within the geographic area requested by the patient (or if unable, by the patient's family).  Yes   Patient/family informed of their freedom to choose among providers that offer the needed level of care, that participate in Medicare, Medicaid or managed care program needed by the patient, have an available bed and are willing to accept the patient.  Yes   Patient/family informed of [facility]'s ownership interest in [facility] and [facility], as well as of the fact that they are under no obligation to receive care at these facilities.  PASRR submitted to EDS on       PASRR number received on       Existing PASRR number confirmed on 12/25/14     FL2 transmitted to all facilities in geographic area requested by pt/family on       FL2 transmitted to all facilities within larger geographic area on       Patient informed that his/her managed care company has contracts with or will negotiate with certain facilities, including the following:        Yes   Patient/family informed of bed offers received.  Patient chooses bed at [place] [facility]     Physician recommends and patient chooses bed at      Patient to be transferred to   on  .  Patient to be transferred to facility by       Patient family notified on   of transfer.  Name of family member notified:        PHYSICIAN       Additional Comment: CSW spoke with patient's daughter, Junie Bame, and advised that a bed offer had been made  from [facility]. Ms. Celesta Aver accepted the bed offer from [facility].    _______________________________________________ [name], LCSW 12/26/2014, 10:45 AM [phone]

## 2014-12-26 NOTE — Progress Notes (Signed)
[facility] PROGRESS NOTE Assessment/Plan: Sepsis Recurrent UTI ? Due to VRE: - She was started empirically on Zyvox 12/24/2014 she was fluid resuscitated. - I have discussed with the family the risk and benefits at her CODE STATUS there can have a family meeting and discuss her CODE STATUS. - KVO IV fluid. hypothermia resolved, d/c bear hugger. - Blood cultures continue to be negative.  Acute kidney injury/ CKD (chronic kidney disease) stage 3, GFR 30-59 ml/min: - baseline cr 1.4-1.6. - Likely due to sepsis, Cr continues to improve with IV hydration. - UOP increase. Able to take oral, KVO IV fluids.  Leukopenia: - Unsure if due to Zyvox vs due to his overwhelming sepsis. - has stabilize along with Plt's will cont to monitor.  Encephalopathy acute: - No improved compare 12/16/2014. - Likely due to infectious etiology.  Lactic acidosis: - Due to sepsis resolved with aggressive resuscitation.  Normocytic anemia: - Is a mild drop in hemoglobin of 2.0 she was hemoconcentrated. - Compared to previous hemoglobin in previous admissions and has remained stable.  Dementia with behavioral disturbance - Use Haldol for agitation avoid benzodiazepines or narcotics.  Paroxysmal atrial fibrillation: Rate control, not a candidate for anticoagulation due to GI bleed.  HTN (hypertension)  Thrombocytopenia: - Patient was continued to decrease slowly we'll continue to monitor.  Code Status: full Family Communication: daughter  Disposition Plan: inpatient   Consultants:  none  Procedures:  CXR  Antibiotics:  zyvox and rocpehin 6.28.2016  HPI/Subjective: She is more alert today she knows she is at the hospital.  Objective: Filed Vitals:   12/26/14 0400 12/26/14 0500 12/26/14 0607 12/26/14 0715  BP: 91/65 102/62 120/78   Pulse: 84     Temp: 97.7 F (36.5 C)     TempSrc: Rectal     Resp: 17 16    Height:      Weight:  81.1 kg (178 lb 12.7 oz)    SpO2: 97%    98%    Intake/Output Summary (Last 24 hours) at 12/26/14 0750 Last data filed at 12/26/14 0500  Gross per 24 hour  Intake    240 ml  Output    900 ml  Net   -660 ml   Filed Weights   12/24/14 1148 12/25/14 0500 12/26/14 0500  Weight: 73.7 kg (162 lb 7.7 oz) 77.8 kg (171 lb 8.3 oz) 81.1 kg (178 lb 12.7 oz)    Exam:  General: Alert, awake, oriented x1, in no acute distress.  HEENT: No bruits, no goiter.  Heart: Regular rate and rhythm. Lungs: Good air movement, clear Abdomen: Soft, nontender, nondistended, positive bowel sounds.  Neuro: Grossly intact, nonfocal.   Data Reviewed: Basic Metabolic Panel:  Recent Labs Lab 12/24/14 0917 12/24/14 1246 12/25/14 0436 12/26/14 0520  NA 135 136 137 133*  K 4.0 4.2 4.5 3.5  CL 94* 100* 107 104  CO2 29 22 25 23   GLUCOSE 100* 87 72 80  BUN 15 14 11 9   CREATININE 2.52* 2.58* 1.81* 1.63*  CALCIUM 8.7* 8.3* 6.7* 7.4*   Liver Function Tests:  Recent Labs Lab 12/24/14 0917 12/24/14 1246  AST 31 29  ALT 11* 11*  ALKPHOS 44 44  BILITOT 0.9 0.8  PROT 7.4 7.4  ALBUMIN 3.7 3.7   No results for input(s): LIPASE, AMYLASE in the last 168 hours. No results for input(s): AMMONIA in the last 168 hours. CBC:  Recent Labs Lab 12/24/14 0917 12/24/14 1246 12/25/14 0436 12/26/14 0520  WBC 4.4 4.2  2.6* 2.6*  NEUTROABS 1.5* 2.0  --   --   HGB 11.9* 11.8* 9.1* 9.0*  HCT 37.3 37.4 28.7* 28.9*  MCV 94.0 95.2 96.3 95.7  PLT 130* 129* 109* 104*   Cardiac Enzymes: No results for input(s): CKTOTAL, CKMB, CKMBINDEX, TROPONINI in the last 168 hours. BNP (last 3 results)  Recent Labs  12/10/14 0039  BNP 95.0    ProBNP (last 3 results) No results for input(s): PROBNP in the last 8760 hours.  CBG:  Recent Labs Lab 12/24/14 0902  GLUCAP 100*    Recent Results (from the past 240 hour(s))  Urine culture     Status: None (Preliminary result)   Collection Time: 12/24/14  8:58 AM  Result Value Ref Range Status   Specimen  Description URINE, CATHETERIZED  Final   Special Requests NONE  Final   Culture   Final    CULTURE REINCUBATED FOR BETTER GROWTH Performed at [facility]    Report Status PENDING  Incomplete  Blood Culture (routine x 2)     Status: None (Preliminary result)   Collection Time: 12/24/14  9:20 AM  Result Value Ref Range Status   Specimen Description BLOOD RIGHT WRIST DRAWN BY RN  Final   Special Requests BOTTLES DRAWN AEROBIC AND ANAEROBIC [place]  Final   Culture NO GROWTH 1 DAY  Final   Report Status PENDING  Incomplete  Blood Culture (routine x 2)     Status: None (Preliminary result)   Collection Time: 12/24/14  9:53 AM  Result Value Ref Range Status   Specimen Description BLOOD RIGHT ANTECUBITAL  Final   Special Requests BOTTLES DRAWN AEROBIC AND ANAEROBIC [place] EACH  Final   Culture NO GROWTH 1 DAY  Final   Report Status PENDING  Incomplete  MRSA PCR Screening     Status: None   Collection Time: 12/24/14 11:45 AM  Result Value Ref Range Status   MRSA by PCR NEGATIVE NEGATIVE Final    Comment:        The GeneXpert MRSA Assay (FDA approved for NASAL specimens only), is one component of a comprehensive MRSA colonization surveillance program. It is not intended to diagnose MRSA infection nor to guide or monitor treatment for MRSA infections.      Studies: Ct Head Wo Contrast  12/24/2014   CLINICAL DATA:  Altered mental status  EXAM: CT HEAD WITHOUT CONTRAST  TECHNIQUE: Contiguous axial images were obtained from the base of the skull through the vertex without intravenous contrast.  COMPARISON:  10/01/2014  FINDINGS: Bony calvarium is intact. 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 No findings to suggest acute hemorrhage, acute infarction or space-occupying mass lesion are noted.  IMPRESSION: Chronic changes without acute abnormality.   Electronically Signed   By: Inez Catalina M.D.   On: 12/24/2014 10:18   Dg Chest Port 1  View  12/24/2014   CLINICAL DATA:  Seizures.  EXAM: PORTABLE CHEST - 1 VIEW  COMPARISON:  December 15, 2014.  FINDINGS: The heart size and mediastinal contours are within normal limits. Both lungs are clear. No pneumothorax or pleural effusion is noted. The visualized skeletal structures are unremarkable.  IMPRESSION: No acute cardiopulmonary abnormality seen.   Electronically Signed   By: Marijo Conception, M.D.   On: 12/24/2014 09:49   Dg Chest Port 1v Same Day  12/24/2014   CLINICAL DATA:  Sepsis and worsening shortness of Breath  EXAM: PORTABLE CHEST -  1 VIEW SAME DAY  COMPARISON:  12/24/2014  FINDINGS: Cardiac shadow is stable. Tortuosity of the thoracic aorta is again seen with some calcification. Patient rotation to the right accentuates the mediastinal markings. No focal infiltrate or sizable effusion is seen.  IMPRESSION: No acute abnormality noted.   Electronically Signed   By: Inez Catalina M.D.   On: 12/24/2014 12:58    Scheduled Meds: . antiseptic oral rinse  7 mL Mouth Rinse q12n4p  . atorvastatin  40 mg Oral QHS  . cefTRIAXone (ROCEPHIN)  IV  1 g Intravenous Q24H  . chlorhexidine  15 mL Mouth Rinse BID  . clopidogrel  75 mg Oral QHS  . diltiazem  30 mg Oral 3 times per day  . enoxaparin (LOVENOX) injection  30 mg Subcutaneous Q24H  . ipratropium-albuterol  3 mL Nebulization Q6H  . linezolid  600 mg Intravenous Q12H  . senna-docusate  1 tablet Oral Daily  . sodium chloride  1,000 mL Intravenous Once  . sodium chloride  3 mL Intravenous Q12H  . tamsulosin  0.4 mg Oral QPC supper  . traZODone  50 mg Oral QHS   Continuous Infusions:    Time Spent: 35 min   Charlynne Cousins  [facility] Pager [phone]. If 7PM-7AM, please contact night-coverage at [email or web address], password [facility] 12/26/2014, 7:50 AM  LOS: 2 days

## 2014-12-27 DIAGNOSIS — N179 Acute kidney failure, unspecified: Secondary | ICD-10-CM

## 2014-12-27 DIAGNOSIS — N183 Chronic kidney disease, stage 3 (moderate): Secondary | ICD-10-CM

## 2014-12-27 DIAGNOSIS — G9340 Encephalopathy, unspecified: Secondary | ICD-10-CM

## 2014-12-27 DIAGNOSIS — F0391 Unspecified dementia with behavioral disturbance: Secondary | ICD-10-CM

## 2014-12-27 DIAGNOSIS — A419 Sepsis, unspecified organism: Secondary | ICD-10-CM

## 2014-12-27 LAB — BASIC METABOLIC PANEL
Anion gap: 8 (ref 5–15)
BUN: 7 mg/dL (ref 6–20)
CO2: 26 mmol/L (ref 22–32)
CREATININE: 1.45 mg/dL — AB (ref 0.44–1.00)
Calcium: 8.2 mg/dL — ABNORMAL LOW (ref 8.9–10.3)
Chloride: 100 mmol/L — ABNORMAL LOW (ref 101–111)
GFR calc Af Amer: 39 mL/min — ABNORMAL LOW (ref >60)
GFR calc non Af Amer: 33 mL/min — ABNORMAL LOW (ref >60)
Glucose, Bld: 95 mg/dL (ref 65–99)
Potassium: 3.1 mmol/L — ABNORMAL LOW (ref 3.5–5.1)
Sodium: 134 mmol/L — ABNORMAL LOW (ref 135–145)

## 2014-12-27 MED ORDER — IPRATROPIUM-ALBUTEROL 0.5-2.5 (3) MG/3ML IN SOLN
3.0000 mL | Freq: Three times a day (TID) | RESPIRATORY_TRACT | Status: DC
  Administered 2014-12-27 – 2014-12-29 (×7): 3 mL via RESPIRATORY_TRACT
  Filled 2014-12-27 (×7): qty 3

## 2014-12-27 MED ORDER — FLUCONAZOLE IN SODIUM CHLORIDE 200-0.9 MG/100ML-% IV SOLN
200.0000 mg | INTRAVENOUS | Status: AC
  Administered 2014-12-27 – 2014-12-28 (×2): 200 mg via INTRAVENOUS
  Filled 2014-12-27 (×2): qty 100

## 2014-12-27 MED ORDER — ENOXAPARIN SODIUM 40 MG/0.4ML ~~LOC~~ SOLN: 40.0000 mg | SUBCUTANEOUS | Status: DC

## 2014-12-27 NOTE — Clinical Social Work Note (Signed)
CSW advised Brandy Mcguire that patient would be discharged on Sunday as she needed two more days of IV antibiotics.   [name] was agreeable to a weekend discharge.    [name], [place]

## 2014-12-27 NOTE — Care Management Note (Signed)
Case Management Note  Patient Details  Name: Brandy Mcguire MRN: [id number] Date of Birth: [birth year]  Expected Discharge Date:  12/28/14               Expected Discharge Plan:  [place]  In-House Referral:  Clinical Social Work  Discharge planning Services  CM Consult  Post Acute Care Choice:  NA Choice offered to:  NA  DME Arranged:    DME Agency:     HH Arranged:    [place] Agency:     Status of Service:  Completed, signed off  Medicare Important Message Given:  Yes-second notification given Date Medicare IM Given:    Medicare IM give by:    Date Additional Medicare IM Given:    Additional Medicare Important Message give by:     If discussed at [place] of Stay Meetings, dates discussed:    Additional Comments: Plan now for patient to discharge to [facility]. CSW has arranged for placement at [facility]. CM has notified [facility] of discharge plan. No further CM needs.  Sherald Barge, RN 12/27/2014, 8:56 AM

## 2014-12-27 NOTE — Progress Notes (Signed)
[facility] PROGRESS NOTE  Brandy Mcguire TSV:[id number] DOB: Nov 06, 1933 DOA: 12/24/2014 PCP: [name], MD  Assessment/Plan: Sepsis secondary to recurrent UTI -I do not believe she needs further treatment with Zyvox as she has now completed 10 days. -See no indication for Rocephin and as such will discontinue. -Urine cultures from 6/19 and 6/28 have grown yeast and since she came in septic I have to believe this is an active infection more so than a colonization. -I discussed via telephone with infectious diseases, Dr. Johnnye Sima and plan is to start IV Diflucan 200 mg for 2 days and then transition to oral to complete a week treatment. -Has a chronic indwelling Foley catheter due to urinary retention and has yet to follow up with urology since placement approximately 8 weeks ago. She will need neurology follow-up at some point after discharge.  -Sepsis parameters have resolved.  Acute on chronic kidney disease stage III -Baseline creatinine is between 1.4 and 1.6. -Acute decompensation secondary to sepsis and hypotension, creatinine is now back down to baseline at 1.45.  Acute encephalopathy -Secondary to sepsis on top of baseline dementia. -Per daughter at bedside, back to baseline.  Lactic acidosis -Due to sepsis, resolved aggressive fluid resuscitation.  Dementia -At baseline, no current behavioral disturbance.  Paroxysmal atrial fibrillation -Rate controlled, not candidate for anticoagulation due to prior history of GI bleed.  Mild thrombocytopenia -Platelets around 105. -Discontinue Lovenox and start SCDs for DVT prophylaxis.    Code Status: Full code Family Communication: Daughter at bedside updated on plan of care  Disposition [facility] in 24-48 hours   Consultants:  None   Antibiotics:  None   Subjective: Confused, no active complaints.  Objective: Filed Vitals:   12/27/14 0537 12/27/14 0659 12/27/14 1413 12/27/14 1500  BP:  106/69   161/106  Pulse: 91   86  Temp: 97.6 F (36.4 C)     TempSrc: Oral     Resp: 18   18  Height:      Weight:      SpO2: 98% 98% 91% 96%    Intake/Output Summary (Last 24 hours) at 12/27/14 1714 Last data filed at 12/27/14 0548  Gross per 24 hour  Intake   1660 ml  Output   1600 ml  Net     60 ml   Filed Weights   12/24/14 1148 12/25/14 0500 12/26/14 0500  Weight: 73.7 kg (162 lb 7.7 oz) 77.8 kg (171 lb 8.3 oz) 81.1 kg (178 lb 12.7 oz)    Exam:   General:  Awake, oriented to person only  Cardiovascular: RRR  Respiratory: CTA B  Abdomen: S/NT/ND/+BS  Extremities: no C/C/E   Neurologic:  Moves all 4 spontaneously  Data Reviewed: Basic Metabolic Panel:  Recent Labs Lab 12/24/14 0917 12/24/14 1246 12/25/14 0436 12/26/14 0520 12/27/14 0536  NA 135 136 137 133* 134*  K 4.0 4.2 4.5 3.5 3.1*  CL 94* 100* 107 104 100*  CO2 29 22 25 23 26   GLUCOSE 100* 87 72 80 95  BUN 15 14 11 9 7   CREATININE 2.52* 2.58* 1.81* 1.63* 1.45*  CALCIUM 8.7* 8.3* 6.7* 7.4* 8.2*   Liver Function Tests:  Recent Labs Lab 12/24/14 0917 12/24/14 1246  AST 31 29  ALT 11* 11*  ALKPHOS 44 44  BILITOT 0.9 0.8  PROT 7.4 7.4  ALBUMIN 3.7 3.7   No results for input(s): LIPASE, AMYLASE in the last 168 hours. No results for input(s): AMMONIA in the  last 168 hours. CBC:  Recent Labs Lab 12/24/14 0917 12/24/14 1246 12/25/14 0436 12/26/14 0520  WBC 4.4 4.2 2.6* 2.6*  NEUTROABS 1.5* 2.0  --   --   HGB 11.9* 11.8* 9.1* 9.0*  HCT 37.3 37.4 28.7* 28.9*  MCV 94.0 95.2 96.3 95.7  PLT 130* 129* 109* 104*   Cardiac Enzymes: No results for input(s): CKTOTAL, CKMB, CKMBINDEX, TROPONINI in the last 168 hours. BNP (last 3 results)  Recent Labs  12/10/14 0039  BNP 95.0    ProBNP (last 3 results) No results for input(s): PROBNP in the last 8760 hours.  CBG:  Recent Labs Lab 12/24/14 0902  GLUCAP 100*    Recent Results (from the past 240 hour(s))  Urine culture      Status: None   Collection Time: 12/24/14  8:58 AM  Result Value Ref Range Status   Specimen Description URINE, CATHETERIZED  Final   Special Requests NONE  Final   Culture   Final    >=100,000 COLONIES/mL YEAST Performed at [facility]    Report Status 12/26/2014 FINAL  Final  Blood Culture (routine x 2)     Status: None (Preliminary result)   Collection Time: 12/24/14  9:20 AM  Result Value Ref Range Status   Specimen Description BLOOD RIGHT WRIST DRAWN BY RN  Final   Special Requests BOTTLES DRAWN AEROBIC AND ANAEROBIC 6CC EACH  Final   Culture NO GROWTH 2 DAYS  Final   Report Status PENDING  Incomplete  Blood Culture (routine x 2)     Status: None (Preliminary result)   Collection Time: 12/24/14  9:53 AM  Result Value Ref Range Status   Specimen Description BLOOD RIGHT ANTECUBITAL  Final   Special Requests BOTTLES DRAWN AEROBIC AND ANAEROBIC [place]  Final   Culture NO GROWTH 2 DAYS  Final   Report Status PENDING  Incomplete  MRSA PCR Screening     Status: None   Collection Time: 12/24/14 11:45 AM  Result Value Ref Range Status   MRSA by PCR NEGATIVE NEGATIVE Final    Comment:        The GeneXpert MRSA Assay (FDA approved for NASAL specimens only), is one component of a comprehensive MRSA colonization surveillance program. It is not intended to diagnose MRSA infection nor to guide or monitor treatment for MRSA infections.      Studies: No results found.  Scheduled Meds: . atorvastatin  40 mg Oral QHS  . clopidogrel  75 mg Oral QHS  . diltiazem  30 mg Oral 3 times per day  . [START ON 12/28/2014] enoxaparin (LOVENOX) injection  40 mg Subcutaneous Q24H  . fluconazole (DIFLUCAN) IV  200 mg Intravenous Q24H  . ipratropium-albuterol  3 mL Nebulization TID  . senna-docusate  1 tablet Oral Daily  . sodium chloride  1,000 mL Intravenous Once  . sodium chloride  3 mL Intravenous Q12H  . tamsulosin  0.4 mg Oral QPC supper  . traZODone  50 mg Oral QHS    Continuous Infusions:   Principal Problem:   Sepsis Active Problems:   HTN (hypertension)   Recurrent UTI   Lactic acidosis   Dementia with behavioral disturbance   Acute kidney injury   CKD (chronic kidney disease) stage 3, GFR 30-59 ml/min   Encephalopathy acute   Leukopenia    Time spent: 35 minutes. Greater than 50% of this time was spent in direct contact with the patient coordinating care.    HERNANDEZ ACOSTA,ESTELA  [facility] Pager [phone]  If 7PM-7AM, please contact night-coverage at [email or web address], password [facility] 12/27/2014, 5:14 PM  LOS: 3 days

## 2014-12-27 NOTE — Care Management (Signed)
Important Message  Patient Details  Name: Brandy Mcguire MRN: [id number] Date of Birth: [birth year]   Medicare Important Message Given:  Yes-second notification given    Sherald Barge, RN 12/27/2014, 8:55 AM

## 2014-12-28 LAB — BASIC METABOLIC PANEL
ANION GAP: 7 (ref 5–15)
BUN: 7 mg/dL (ref 6–20)
CALCIUM: 8.4 mg/dL — AB (ref 8.9–10.3)
CO2: 27 mmol/L (ref 22–32)
CREATININE: 1.47 mg/dL — AB (ref 0.44–1.00)
Chloride: 99 mmol/L — ABNORMAL LOW (ref 101–111)
GFR, EST AFRICAN AMERICAN: 38 mL/min — AB (ref >60)
GFR, EST NON AFRICAN AMERICAN: 33 mL/min — AB (ref >60)
Glucose, Bld: 87 mg/dL (ref 65–99)
Potassium: 3.2 mmol/L — ABNORMAL LOW (ref 3.5–5.1)
Sodium: 133 mmol/L — ABNORMAL LOW (ref 135–145)

## 2014-12-28 NOTE — Progress Notes (Signed)
[facility] PROGRESS NOTE  Brandy Mcguire GNF:[id number] DOB: [birth year] DOA: 12/24/2014 PCP: [name], MD  Assessment/Plan:  Sepsis secondary to recurrent UTI -Has completed treatment with Zyvox. -Most recent cultures on 6/19 and 6/28 have grown yeast and since she is symptomatic, after discussion with infectious diseases Dr. Johnnye Sima, we decided to treat her with IV Diflucan for 2 days and then transitioned to oral to complete a week of treatment. -Unfortunately, it appears that despite order for Diflucan was not administered yesterday, as such will receive first dose today second dose tomorrow and hope for discharge back to SNF tomorrow. -Sepsis parameters have resolved. -Has chronic indwelling Foley catheter due to urinary retention and will need outpatient follow-up with urology upon discharge.   Acute on chronic kidney disease stage III -Creatinine is at baseline between 1.4 and 1.6.  Acute encephalopathy -Secondary to sepsis on top of baseline dementia. -Per daughter, back to baseline.  Paroxysmal atrial fibrillation -Rate controlled, not candidate for anticoagulation due to prior history of GI bleed.  Mild thrombocytopenia -Lovenox has been discontinued, recheck platelet count in the morning.  Code Status: Full code Family Communication: Patient only daughter updated 7/1  Disposition Plan: Back to SNF hopefully in 24 hours   Consultants:  None   Antibiotics:  None   Subjective: Confused, pleasant  Objective: Filed Vitals:   12/27/14 2140 12/28/14 0646 12/28/14 0722 12/28/14 1457  BP: 109/69 163/95    Pulse: 96 89    Temp: 97.6 F (36.4 C) 97.7 F (36.5 C)    TempSrc: Oral Oral    Resp: 18 16    Height:      Weight:      SpO2: 94% 100% 97% 97%    Intake/Output Summary (Last 24 hours) at 12/28/14 1503 Last data filed at 12/28/14 0800  Gross per 24 hour  Intake    160 ml  Output    900 ml  Net   -740 ml   Filed Weights   12/24/14 1148 12/25/14 0500 12/26/14 0500  Weight: 73.7 kg (162 lb 7.7 oz) 77.8 kg (171 lb 8.3 oz) 81.1 kg (178 lb 12.7 oz)    Exam:   General:  Awake, confused  Cardiovascular: Irregular,  Respiratory: Clear to auscultation bilaterally  Abdomen: Soft, nontender, nondistended, positive bowel sounds  Extremities: No clubbing, cyanosis or edema positive pulses   Neurologic:  Nonfocal  Data Reviewed: Basic Metabolic Panel:  Recent Labs Lab 12/24/14 1246 12/25/14 0436 12/26/14 0520 12/27/14 0536 12/28/14 0633  NA 136 137 133* 134* 133*  K 4.2 4.5 3.5 3.1* 3.2*  CL 100* 107 104 100* 99*  CO2 22 25 23 26 27   GLUCOSE 87 72 80 95 87  BUN 14 11 9 7 7   CREATININE 2.58* 1.81* 1.63* 1.45* 1.47*  CALCIUM 8.3* 6.7* 7.4* 8.2* 8.4*   Liver Function Tests:  Recent Labs Lab 12/24/14 0917 12/24/14 1246  AST 31 29  ALT 11* 11*  ALKPHOS 44 44  BILITOT 0.9 0.8  PROT 7.4 7.4  ALBUMIN 3.7 3.7   No results for input(s): LIPASE, AMYLASE in the last 168 hours. No results for input(s): AMMONIA in the last 168 hours. CBC:  Recent Labs Lab 12/24/14 0917 12/24/14 1246 12/25/14 0436 12/26/14 0520  WBC 4.4 4.2 2.6* 2.6*  NEUTROABS 1.5* 2.0  --   --   HGB 11.9* 11.8* 9.1* 9.0*  HCT 37.3 37.4 28.7* 28.9*  MCV 94.0 95.2 96.3 95.7  PLT 130* 129* 109*  104*   Cardiac Enzymes: No results for input(s): CKTOTAL, CKMB, CKMBINDEX, TROPONINI in the last 168 hours. BNP (last 3 results)  Recent Labs  12/10/14 0039  BNP 95.0    ProBNP (last 3 results) No results for input(s): PROBNP in the last 8760 hours.  CBG:  Recent Labs Lab 12/24/14 0902  GLUCAP 100*    Recent Results (from the past 240 hour(s))  Urine culture     Status: None   Collection Time: 12/24/14  8:58 AM  Result Value Ref Range Status   Specimen Description URINE, CATHETERIZED  Final   Special Requests NONE  Final   Culture   Final    >=100,000 COLONIES/mL YEAST Performed at [facility]     Report Status 12/26/2014 FINAL  Final  Blood Culture (routine x 2)     Status: None (Preliminary result)   Collection Time: 12/24/14  9:20 AM  Result Value Ref Range Status   Specimen Description BLOOD RIGHT WRIST DRAWN BY RN  Final   Special Requests BOTTLES DRAWN AEROBIC AND ANAEROBIC 6CC EACH  Final   Culture NO GROWTH 4 DAYS  Final   Report Status PENDING  Incomplete  Blood Culture (routine x 2)     Status: None (Preliminary result)   Collection Time: 12/24/14  9:53 AM  Result Value Ref Range Status   Specimen Description BLOOD RIGHT ANTECUBITAL  Final   Special Requests BOTTLES DRAWN AEROBIC AND ANAEROBIC [place] [facility]  Final   Culture NO GROWTH 4 DAYS  Final   Report Status PENDING  Incomplete  MRSA PCR Screening     Status: None   Collection Time: 12/24/14 11:45 AM  Result Value Ref Range Status   MRSA by PCR NEGATIVE NEGATIVE Final    Comment:        The GeneXpert MRSA Assay (FDA approved for NASAL specimens only), is one component of a comprehensive MRSA colonization surveillance program. It is not intended to diagnose MRSA infection nor to guide or monitor treatment for MRSA infections.      Studies: No results found.  Scheduled Meds: . atorvastatin  40 mg Oral QHS  . clopidogrel  75 mg Oral QHS  . diltiazem  30 mg Oral 3 times per day  . fluconazole (DIFLUCAN) IV  200 mg Intravenous Q24H  . ipratropium-albuterol  3 mL Nebulization TID  . senna-docusate  1 tablet Oral Daily  . sodium chloride  1,000 mL Intravenous Once  . sodium chloride  3 mL Intravenous Q12H  . tamsulosin  0.4 mg Oral QPC supper  . traZODone  50 mg Oral QHS   Continuous Infusions:   Principal Problem:   Sepsis Active Problems:   HTN (hypertension)   Recurrent UTI   Lactic acidosis   Dementia with behavioral disturbance   Acute kidney injury   CKD (chronic kidney disease) stage 3, GFR 30-59 ml/min   Encephalopathy acute   Leukopenia    Time spent: 25 minutes. Greater than 50% of  this time was spent in direct contact with the patient coordinating care.    [name]  [facility] Pager [phone]  If 7PM-7AM, please contact night-coverage at [email or web address], password [facility] 12/28/2014, 3:03 PM  LOS: 4 days

## 2014-12-28 NOTE — Progress Notes (Signed)
Notified MD, pt blood pressure was 109/61. MD stated to hold Cardizem.

## 2014-12-28 NOTE — Progress Notes (Signed)
Patient started back on 2lpm/[place] for low saturation 88 . She also appears to be wheezing more will monitor and given prn nebulizer if needed. Her confusion appeared also to be a little more this evening. Will also monitor through out night.

## 2014-12-29 LAB — CULTURE, BLOOD (ROUTINE X 2)
CULTURE: NO GROWTH
CULTURE: NO GROWTH

## 2014-12-29 MED ORDER — FLUCONAZOLE 100 MG PO TABS: 100.0000 mg | ORAL_TABLET | Freq: Every day | ORAL | Status: DC

## 2014-12-29 NOTE — Discharge Summary (Signed)
Physician Discharge Summary  Brandy Mcguire QPY:[id number] DOB: [birth year] DOA: 12/24/2014  PCP: [name], MD  Admit date: 12/24/2014 Discharge date: 12/29/2014  Time spent: 45 minutes  Recommendations for Outpatient Follow-up:  -Will be discharged back to SNF today. -2 complete a one-week course of fluconazole for her yeast UTI.   Discharge Diagnoses:  Principal Problem:   Sepsis Active Problems:   HTN (hypertension)   Recurrent UTI   Lactic acidosis   Dementia with behavioral disturbance   Acute kidney injury   CKD (chronic kidney disease) stage 3, GFR 30-59 ml/min   Encephalopathy acute   Leukopenia   Discharge Condition: Stable and improved  Filed Weights   12/24/14 1148 12/25/14 0500 12/26/14 0500  Weight: 73.7 kg (162 lb 7.7 oz) 77.8 kg (171 lb 8.3 oz) 81.1 kg (178 lb 12.7 oz)    History of present illness:  81 y.o. female with past medical history of dementia discharge from the hospital at the beginning of June for UTI due to VRE was noncompliant with her Zyvox, readmitted on 12/15/2014 and DC on 12/17/2058 on home Zyvox returns to the hospital 12/16/2014 for confusion. There is no family present at bedside, unable to contact them by phone so most of the history is obtained through her medical records. She has an indwelling Foley catheter, and she has minimal urine. Patient is obtunded and only responsive to pain which she opened her eyes.  In the ED: She was found to be hypotensive and was given 2 L of normal saline   Hospital Course:   Sepsis secondary to recurrent UTI -Sepsis parameters have resolved. -Please see below for details.  UTI secondary to yeast secondary to chronic indwelling Foley catheter -Most recent urine cultures on 6/19 and 6/28 have grown yeast and since she was symptomatic and septic, after discussion with infectious diseases Dr. Johnnye Sima, we decided to treat her with IV Diflucan for 2 days and then transition her to oral to  complete a week of treatment. -She recently had a VRE UTI and completed 10 days of Zyvox for this. -As an indwelling Foley catheter due to urinary retention and will need outpatient follow-up with urology upon discharge.  Acute on chronic kidney disease stage III -Creatinine is at baseline between 1.4 and 1.6.  Acute encephalopathy -Secondary to sepsis and top of baseline dementia. -Per daughter back to baseline.  Paroxysmal atrial fibrillation -Rate controlled, not candidate for anticoagulation due to prior history of GI bleed.  Mild thrombocytopenia -Platelet count has remained stable at around 104 209, suspect secondary to recent use of Zyvox.   Procedures:  None   Consultations:  None  Discharge Instructions  Discharge Instructions    Increase activity slowly    Complete by:  As directed             Medication List    STOP taking these medications        linezolid 600 MG tablet  Commonly known as:  ZYVOX      TAKE these medications        atorvastatin 40 MG tablet  Commonly known as:  LIPITOR  Take 40 mg by mouth at bedtime.     clopidogrel 75 MG tablet  Commonly known as:  PLAVIX  Take 1 tablet (75 mg total) by mouth at bedtime. Resume in 1 week if hemoglobin stable     diltiazem 30 MG tablet  Commonly known as:  CARDIZEM  Take 1 tablet (30 mg total)  by mouth every 8 (eight) hours.     fluconazole 100 MG tablet  Commonly known as:  DIFLUCAN  Take 1 tablet (100 mg total) by mouth daily.     ipratropium-albuterol 0.5-2.5 (3) MG/3ML Soln  Commonly known as:  DUONEB  Take 3 mLs by nebulization every 6 (six) hours.     senna-docusate 8.6-50 MG per tablet  Commonly known as:  Senokot-S  Take 1 tablet by mouth daily.     tamsulosin 0.4 MG Caps capsule  Commonly known as:  FLOMAX  Take 1 capsule (0.4 mg total) by mouth daily after supper.     traZODone 50 MG tablet  Commonly known as:  DESYREL  Take 50 mg by mouth at bedtime.       No Known  Allergies    The results of significant diagnostics from this hospitalization (including imaging, microbiology, ancillary and laboratory) are listed below for reference.    Significant Diagnostic Studies: Ct Head Wo Contrast  12/24/2014   CLINICAL DATA:  Altered mental status  EXAM: CT HEAD WITHOUT CONTRAST  TECHNIQUE: Contiguous axial images were obtained from the base of the skull through the vertex without intravenous contrast.  COMPARISON:  10/01/2014  FINDINGS: Bony calvarium is intact. 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 No findings to suggest acute hemorrhage, acute infarction or space-occupying mass lesion are noted.  IMPRESSION: Chronic changes without acute abnormality.   Electronically Signed   By: Inez Catalina M.D.   On: 12/24/2014 10:18   Dg Chest Port 1 View  12/24/2014   CLINICAL DATA:  Seizures.  EXAM: PORTABLE CHEST - 1 VIEW  COMPARISON:  December 15, 2014.  FINDINGS: The heart size and mediastinal contours are within normal limits. Both lungs are clear. No pneumothorax or pleural effusion is noted. The visualized skeletal structures are unremarkable.  IMPRESSION: No acute cardiopulmonary abnormality seen.   Electronically Signed   By: Marijo Conception, M.D.   On: 12/24/2014 09:49   Dg Chest Port 1 View  12/15/2014   CLINICAL DATA:  Failure to thrive  EXAM: PORTABLE CHEST - 1 VIEW  COMPARISON:  12/10/2014  FINDINGS: Heart is borderline enlarged. Marked tortuosity of the thoracic aorta traversing into the right lower lung zone is stable. Minimal subsegmental atelectasis at the right lung base. Left lung is clear. No pneumothorax or pleural effusion.  IMPRESSION: No active cardiopulmonary disease.   Electronically Signed   By: Marybelle Killings M.D.   On: 12/15/2014 13:20   Dg Chest Port 1 View  12/10/2014   CLINICAL DATA:  Hypotension and altered mental status tonight. Under treatment for urinary tract infection.  EXAM: PORTABLE CHEST - 1 VIEW   COMPARISON:  10/02/2014  FINDINGS: Dilated and tortuous thoracic aorta. Normal heart size and pulmonary vascularity. Lungs are clear and expanded. No blunting of costophrenic angles. No pneumothorax. Similar appearance to previous study.  IMPRESSION: Tortuous and dilated thoracic aorta. Lungs clear. No change since prior study.   Electronically Signed   By: Lucienne Capers M.D.   On: 12/10/2014 02:24   Dg Chest Port 1v Same Day  12/24/2014   CLINICAL DATA:  Sepsis and worsening shortness of Breath  EXAM: PORTABLE CHEST - 1 VIEW SAME DAY  COMPARISON:  12/24/2014  FINDINGS: Cardiac shadow is stable. Tortuosity of the thoracic aorta is again seen with some calcification. Patient rotation to the right accentuates the mediastinal markings. No focal infiltrate or sizable effusion is seen.  IMPRESSION: No acute abnormality noted.   Electronically Signed   By: Inez Catalina M.D.   On: 12/24/2014 12:58    Microbiology: Recent Results (from the past 240 hour(s))  Urine culture     Status: None   Collection Time: 12/24/14  8:58 AM  Result Value Ref Range Status   Specimen Description URINE, CATHETERIZED  Final   Special Requests NONE  Final   Culture   Final    >=100,000 COLONIES/mL YEAST Performed at [facility]    Report Status 12/26/2014 FINAL  Final  Blood Culture (routine x 2)     Status: None   Collection Time: 12/24/14  9:20 AM  Result Value Ref Range Status   Specimen Description BLOOD RIGHT WRIST DRAWN BY RN  Final   Special Requests BOTTLES DRAWN AEROBIC AND ANAEROBIC [place]  Final   Culture NO GROWTH 5 DAYS  Final   Report Status 12/29/2014 FINAL  Final  Blood Culture (routine x 2)     Status: None   Collection Time: 12/24/14  9:53 AM  Result Value Ref Range Status   Specimen Description BLOOD RIGHT ANTECUBITAL  Final   Special Requests BOTTLES DRAWN AEROBIC AND ANAEROBIC [place]  Final   Culture NO GROWTH 5 DAYS  Final   Report Status 12/29/2014 FINAL  Final  MRSA PCR  Screening     Status: None   Collection Time: 12/24/14 11:45 AM  Result Value Ref Range Status   MRSA by PCR NEGATIVE NEGATIVE Final    Comment:        The GeneXpert MRSA Assay (FDA approved for NASAL specimens only), is one component of a comprehensive MRSA colonization surveillance program. It is not intended to diagnose MRSA infection nor to guide or monitor treatment for MRSA infections.      Labs: Basic Metabolic Panel:  Recent Labs Lab 12/24/14 1246 12/25/14 0436 12/26/14 0520 12/27/14 0536 12/28/14 0633  NA 136 137 133* 134* 133*  K 4.2 4.5 3.5 3.1* 3.2*  CL 100* 107 104 100* 99*  CO2 22 25 23 26 27   GLUCOSE 87 72 80 95 87  BUN 14 11 9 7 7   CREATININE 2.58* 1.81* 1.63* 1.45* 1.47*  CALCIUM 8.3* 6.7* 7.4* 8.2* 8.4*   Liver Function Tests:  Recent Labs Lab 12/24/14 0917 12/24/14 1246  AST 31 29  ALT 11* 11*  ALKPHOS 44 44  BILITOT 0.9 0.8  PROT 7.4 7.4  ALBUMIN 3.7 3.7   No results for input(s): LIPASE, AMYLASE in the last 168 hours. No results for input(s): AMMONIA in the last 168 hours. CBC:  Recent Labs Lab 12/24/14 0917 12/24/14 1246 12/25/14 0436 12/26/14 0520  WBC 4.4 4.2 2.6* 2.6*  NEUTROABS 1.5* 2.0  --   --   HGB 11.9* 11.8* 9.1* 9.0*  HCT 37.3 37.4 28.7* 28.9*  MCV 94.0 95.2 96.3 95.7  PLT 130* 129* 109* 104*   Cardiac Enzymes: No results for input(s): CKTOTAL, CKMB, CKMBINDEX, TROPONINI in the last 168 hours. BNP: BNP (last 3 results)  Recent Labs  12/10/14 0039  BNP 95.0    ProBNP (last 3 results) No results for input(s): PROBNP in the last 8760 hours.  CBG:  Recent Labs Lab 12/24/14 0902  GLUCAP 100*       Signed:  [name]  [facility] Pager: [phone] 12/29/2014, 11:30 AM

## 2014-12-29 NOTE — Progress Notes (Signed)
Pt has lost iv access. Pt removed iv and stated she was going home. Reoriented pt. AC attempted to get iv access. Attempts unsuccessful.

## 2014-12-29 NOTE — Progress Notes (Signed)
Report called to [facility]. 

## 2015-01-02 ENCOUNTER — Encounter (HOSPITAL_COMMUNITY): Payer: Self-pay

## 2015-01-02 ENCOUNTER — Encounter (HOSPITAL_COMMUNITY)
Admission: RE | Admit: 2015-01-02 | Discharge: 2015-01-02 | Disposition: A | Discharge status: Home / Self Care | Payer: No Typology Code available for payment source | Source: Ambulatory Visit | Attending: Internal Medicine | Admitting: Internal Medicine

## 2015-01-02 DIAGNOSIS — D61818 Other pancytopenia: Secondary | ICD-10-CM | POA: Diagnosis present

## 2015-01-02 DIAGNOSIS — N390 Urinary tract infection, site not specified: Secondary | ICD-10-CM | POA: Diagnosis not present

## 2015-01-02 LAB — HEMOGLOBIN AND HEMATOCRIT, BLOOD
HCT: 27.3 % — ABNORMAL LOW (ref 36.0–46.0)
Hemoglobin: 8.7 g/dL — ABNORMAL LOW (ref 12.0–15.0)

## 2015-01-02 LAB — PREPARE RBC (CROSSMATCH)

## 2015-01-02 NOTE — Progress Notes (Signed)
Results for ZIRA, HELINSKI (MRN [id number]) as of 01/02/2015 16:39   For 2 units of PC in the am. Appointment 01/03/2015 @0900   Ref. Range 01/02/2015 16:30  Hemoglobin Latest Ref Range: 12.0-15.0 g/dL 8.7 (L)  HCT Latest Ref Range: 36.0-46.0 % 27.3 (L)

## 2015-01-02 NOTE — Discharge Instructions (Signed)

## 2015-01-03 ENCOUNTER — Encounter (HOSPITAL_COMMUNITY)
Admission: RE | Admit: 2015-01-03 | Discharge: 2015-01-03 | Disposition: A | Discharge status: Home / Self Care | Payer: No Typology Code available for payment source | Source: Ambulatory Visit | Attending: Internal Medicine | Admitting: Internal Medicine

## 2015-01-03 DIAGNOSIS — D61818 Other pancytopenia: Secondary | ICD-10-CM | POA: Diagnosis not present

## 2015-01-03 MED ORDER — SODIUM CHLORIDE 0.9 % IV SOLN
Freq: Once | INTRAVENOUS | Status: AC
  Administered 2015-01-03: 09:00:00 via INTRAVENOUS

## 2015-01-03 MED ORDER — ACETAMINOPHEN 325 MG PO TABS
650.0000 mg | ORAL_TABLET | Freq: Once | ORAL | Status: AC
  Administered 2015-01-03: 650 mg via ORAL

## 2015-01-03 MED ORDER — ACETAMINOPHEN 325 MG PO TABS
ORAL_TABLET | ORAL | Status: AC
  Filled 2015-01-03: qty 2

## 2015-01-03 NOTE — Progress Notes (Signed)
Sleeping. resp adequate/nonlabored. O2 continued.

## 2015-01-03 NOTE — Progress Notes (Signed)
[facility] transportation here. D/C to [facility] in good condition.

## 2015-01-03 NOTE — Progress Notes (Signed)
From [facility]. Transferred to reclinger. Foley patent/draining amber urine. Multiple bruises present on bilateral arms. resp adequate/nonlabored. O2 sat 78% on room air. O2 started via nasal cannula at 2l/m. O2 sat increased to 95% and above. Tolerated well.

## 2015-01-03 NOTE — Progress Notes (Signed)
Awake. States "I am uncomfortable. I want to lay down." pt transferred to stretcher. Tolerated well. States she feels better.

## 2015-01-03 NOTE — Progress Notes (Signed)
Awake. Lunch tray provided. Pt wanted to be fed. Fed pt. Ate very poorly. Encouraged to eat, but refused. Only ate 7 bites of food for lunch and drank 50 ml of coke. States "I don't want any more."

## 2015-01-05 LAB — TYPE AND SCREEN
ABO/RH(D): O POS
Antibody Screen: NEGATIVE
Unit division: 0
Unit division: 0

## 2015-01-22 ENCOUNTER — Encounter (HOSPITAL_COMMUNITY): Payer: No Typology Code available for payment source | Attending: Oncology | Admitting: Oncology

## 2015-01-22 ENCOUNTER — Encounter (HOSPITAL_COMMUNITY): Payer: Self-pay | Admitting: Oncology

## 2015-01-22 ENCOUNTER — Other Ambulatory Visit (HOSPITAL_COMMUNITY): Payer: Self-pay | Admitting: Oncology

## 2015-01-22 ENCOUNTER — Encounter (HOSPITAL_BASED_OUTPATIENT_CLINIC_OR_DEPARTMENT_OTHER): Payer: No Typology Code available for payment source

## 2015-01-22 VITALS — BP 80/49 | HR 82 | Temp 97.7°F | Resp 16 | Wt 152.8 lb

## 2015-01-22 DIAGNOSIS — D696 Thrombocytopenia, unspecified: Secondary | ICD-10-CM | POA: Diagnosis present

## 2015-01-22 DIAGNOSIS — N183 Chronic kidney disease, stage 3 (moderate): Secondary | ICD-10-CM

## 2015-01-22 DIAGNOSIS — D649 Anemia, unspecified: Secondary | ICD-10-CM | POA: Diagnosis not present

## 2015-01-22 DIAGNOSIS — I2510 Atherosclerotic heart disease of native coronary artery without angina pectoris: Secondary | ICD-10-CM

## 2015-01-22 DIAGNOSIS — E039 Hypothyroidism, unspecified: Secondary | ICD-10-CM

## 2015-01-22 LAB — CBC WITH DIFFERENTIAL/PLATELET
Basophils Absolute: 0 10*3/uL (ref 0.0–0.1)
Basophils Relative: 0 % (ref 0–1)
EOS ABS: 0 10*3/uL (ref 0.0–0.7)
EOS PCT: 0 % (ref 0–5)
HCT: 39.9 % (ref 36.0–46.0)
HEMOGLOBIN: 12.6 g/dL (ref 12.0–15.0)
Lymphocytes Relative: 33 % (ref 12–46)
Lymphs Abs: 1.6 10*3/uL (ref 0.7–4.0)
MCH: 29.4 pg (ref 26.0–34.0)
MCHC: 31.6 g/dL (ref 30.0–36.0)
MCV: 93.2 fL (ref 78.0–100.0)
Monocytes Absolute: 0.7 10*3/uL (ref 0.1–1.0)
Monocytes Relative: 14 % — ABNORMAL HIGH (ref 3–12)
Neutro Abs: 2.7 10*3/uL (ref 1.7–7.7)
Neutrophils Relative %: 53 % (ref 43–77)
Platelets: 230 10*3/uL (ref 150–400)
RBC: 4.28 MIL/uL (ref 3.87–5.11)
RDW: 17 % — ABNORMAL HIGH (ref 11.5–15.5)
WBC: 5 10*3/uL (ref 4.0–10.5)

## 2015-01-22 LAB — C-REACTIVE PROTEIN: CRP: 4.6 mg/dL — ABNORMAL HIGH (ref <1.0)

## 2015-01-22 LAB — RETICULOCYTES
RBC.: 4.28 MIL/uL (ref 3.87–5.11)
Retic Count, Absolute: 85.6 10*3/uL (ref 19.0–186.0)
Retic Ct Pct: 2 % (ref 0.4–3.1)

## 2015-01-22 LAB — FOLATE: Folate: 8.9 ng/mL (ref >5.9)

## 2015-01-22 LAB — SEDIMENTATION RATE: SED RATE: 12 mm/h (ref 0–22)

## 2015-01-22 LAB — IRON AND TIBC
IRON: 40 ug/dL (ref 28–170)
Saturation Ratios: 22 % (ref 10.4–31.8)
TIBC: 182 ug/dL — AB (ref 250–450)
UIBC: 142 ug/dL

## 2015-01-22 LAB — VITAMIN B12: Vitamin B-12: 689 pg/mL (ref 180–914)

## 2015-01-22 LAB — FERRITIN: Ferritin: 153 ng/mL (ref 11–307)

## 2015-01-22 NOTE — Progress Notes (Signed)
[facility] Hematology/Oncology Consultation   Name: Brandy Mcguire      MRN: [id number]    Location: Room/bed info not found  Date: 01/22/2015 Time:1:15 PM   REFERRING PHYSICIAN:  Dr. Maudie Mercury  REASON FOR CONSULT:  Thrombocytopenia   DIAGNOSIS:  Thrombocytopenia, anemia  HISTORY OF PRESENT ILLNESS:   Brandy Mcguire is a 82 year old white American woman with a past history significant for recurrent UTI, AAA, H/O sepsis, A-fib, hypothyroidism, failure to thrive, dementia, CKD stage 3, CAD, and anemia who is referred to [facility] for thrombocytopenia.  I personally reviewed and went over laboratory results with the patient.  The results are noted within this dictation.  She is noted to have two episodes of thrombocytopenia, one in April and another in July when she was admitted to the hospital on both occassions with Sepsis.  She was given Lovenox and Heparin during Brandy Mcguire June/July hospitalization (on my review of Brandy Mcguire chart).  Outside of these two hospitalizations within [facility], platelet count has been WNL and stable.  I do not see episodes of significant thrombocytopenia.  I personally reviewed and went over radiographic studies with the patient.  The results are noted within this dictation.   Chart reviewed.  I have reviewed a dictation from Dr. Maudie Mercury, dated 01/15/2015 reported resolution of thrombocytopenia, leukopenia, and improved anemia with a Hgb of 11.3 g/dL.  With this information there is not much to do from a hematology standpoint.  A Hgb of 11.3 does not qualify for transfusion, ESA therapy, etc.  She has documented Stage III chronic kidney disease.  Brandy Mcguire denies any blood in stool, and dark sticky stools.  She reports that she feels at Brandy Mcguire baseline.  She denies any hemoptysis, and hematuria.  She denies any new rashes.  She reports that Brandy Mcguire appetite is "fair."  She notes that she does not eat all of Brandy Mcguire meals.  "I have some food left over on my plate."  She has 5 children, 4 boys,  and 1 girl.  She used to work as a Writer.  She reports that she does walk at the nursing home with minimal assistance.    PAST MEDICAL HISTORY:   Past Medical History  Diagnosis Date  . Stroke   . CAD (coronary artery disease)   . Hypertension   . Bite from cat 03/17/2011  . CHF (congestive heart failure)   . PAF (paroxysmal atrial fibrillation)   . Hypothyroidism   . Dementia   . CKD (chronic kidney disease) stage 3, GFR 30-59 ml/min 09/28/2014  . Thoracoabdominal aortic aneurysm 09/29/2014    6.4 x 6.0 cm; previously 4.2 cm.  . Traumatic hematoma of buttock 09/28/2014  . A-fib     ALLERGIES: No Known Allergies    MEDICATIONS: I have reviewed the patient's current medications.    Current Outpatient Prescriptions on File Prior to Visit  Medication Sig Dispense Refill  . atorvastatin (LIPITOR) 40 MG tablet Take 40 mg by mouth at bedtime.    . clopidogrel (PLAVIX) 75 MG tablet Take 1 tablet (75 mg total) by mouth at bedtime. Resume in 1 week if hemoglobin stable    . diltiazem (CARDIZEM) 30 MG tablet Take 1 tablet (30 mg total) by mouth every 8 (eight) hours.    . fluconazole (DIFLUCAN) 100 MG tablet Take 1 tablet (100 mg total) by mouth daily. 7 tablet 0  . ipratropium-albuterol (DUONEB) 0.5-2.5 (3) MG/3ML SOLN Take 3 mLs by nebulization  every 6 (six) hours. 360 mL   . senna-docusate (SENOKOT-S) 8.6-50 MG per tablet Take 1 tablet by mouth daily.    . tamsulosin (FLOMAX) 0.4 MG CAPS capsule Take 1 capsule (0.4 mg total) by mouth daily after supper. 30 capsule 1  . traZODone (DESYREL) 50 MG tablet Take 50 mg by mouth at bedtime.       No current facility-administered medications on file prior to visit.     PAST SURGICAL HISTORY Past Surgical History  Procedure Laterality Date  . Stents       kidneys and 2 in heart  . Abdominal hysterectomy      FAMILY HISTORY: Family History  Problem Relation Age of Onset  . Cancer Mother   . Cancer Sister   . COPD Sister   .  Heart failure Sister     SOCIAL HISTORY:  reports that she quit smoking about 13 years ago. Brandy Mcguire smoking use included Cigarettes. She has a 50 pack-year smoking history. She has never used smokeless tobacco. She reports that she does not drink alcohol or use illicit drugs.  PERFORMANCE STATUS: The patient's performance status is 3 - Symptomatic, >50% confined to bed  PHYSICAL EXAM: Most Recent Vital Signs: Blood pressure 80/49, pulse 82, temperature 97.7 F (36.5 C), temperature source Oral, resp. rate 16, weight 152 lb 12.8 oz (69.31 kg), SpO2 98 %. General appearance: alert, cooperative, appears stated age, no distress and in wheelchair, accompanied by aid from nursing facility. Head: Normocephalic, without obvious abnormality, atraumatic Eyes: negative findings: lids and lashes normal, conjunctivae and sclerae normal and corneas clear Neck: no adenopathy and supple, symmetrical, trachea midline Lungs: clear to auscultation bilaterally Heart: irregularly irregular rate and rhythm, S1, S2 normal, no murmur, click, rub or gallop Abdomen: soft, non-tender; bowel sounds normal; no masses,  no organomegaly Extremities: extremities normal, atraumatic, no cyanosis or edema, bilateral lower extremity varicose veins L>R. Skin: Skin color, texture, turgor normal. No rashes or lesions Lymph nodes: Cervical, supraclavicular, and axillary nodes normal. Neurologic: Grossly normal  LABORATORY DATA:  I have reviewed the results below  Results for Brandy Mcguire (MRN [id number])   Ref. Range 12/25/2014 04:36 12/25/2014 12:49 12/26/2014 05:20 12/27/2014 05:36 12/28/2014 06:33 01/02/2015 16:30  WBC Latest Ref Range: 4.0-10.5 K/uL 2.6 (L)  2.6 (L)     RBC Latest Ref Range: 3.87-5.11 MIL/uL 2.98 (L)  3.02 (L)     Hemoglobin Latest Ref Range: 12.0-15.0 g/dL 9.1 (L)  9.0 (L)   8.7 (L)  HCT Latest Ref Range: 36.0-46.0 % 28.7 (L)  28.9 (L)   27.3 (L)  MCV Latest Ref Range: 78.0-100.0 fL 96.3  95.7     MCH Latest  Ref Range: 26.0-34.0 pg 30.5  29.8     MCHC Latest Ref Range: 30.0-36.0 g/dL 31.7  31.1     RDW Latest Ref Range: 11.5-15.5 % 15.7 (H)  15.7 (H)     Platelets Latest Ref Range: 150-400 K/uL 109 (L)  104 (L)       PATHOLOGY:  None  ASSESSMENT:  1. Thrombocytopenia, resolved according to Dr. Julianne Rice note from 7/20. 2. Normocytic, normochromic, anemia, improved according to Dr. Julianne Rice note at 11.3 g/dL. 3. Stage III Chronic Renal Disease 4. Dementia 5. Recurrent UTIs  6. AAA 7. H/O sepsis with hospitalizations in April 2016 and June/July 2016, at which time thrombocytopenia was documented. 8. A-fig 9. Hypothyroidism, 10. Failure to thrive 11. CAD   PLAN:  1. I personally reviewed and went over laboratory results  with the patient.  The results are noted within this dictation. 2. I personally reviewed and went over radiographic studies with the patient.  The results are noted within this dictation.   3. Chart reviewed.  What labs are available to me are unimpressive. 4. Labs today: CBC diff, anemia panel, ESR, CRP, Retic count, MM panel 5. Return in 4 weeks for follow-up.  Early repeat laboratory studies. Given Brandy Mcguire mild anemia due to repeat basic anemia panel including a reticulocyte count and in addition check several other labs including inflammatory markers and a myeloma panel. I suspect that Brandy Mcguire prior laboratory abnormalities may have been related to Brandy Mcguire hospitalizations and illnesses. We will bring Brandy Mcguire back to review the results with additional recommendations at that visit.  All questions were answered. The patient knows to call the clinic with any problems, questions or concerns. We can certainly see the patient much sooner if necessary.  This note is electronically signed by: Molli Hazard, MD  01/22/2015 1:15 PM

## 2015-01-22 NOTE — Patient Instructions (Signed)
..  [place] at [facility] Discharge Instructions  RECOMMENDATIONS MADE BY THE CONSULTANT AND ANY TEST RESULTS WILL BE SENT TO YOUR REFERRING PHYSICIAN.  Labs today  Return in 4 weeks   Thank you for choosing [place] at [facility] to provide your oncology and hematology care.  To afford each patient quality time with our provider, please arrive at least 15 minutes before your scheduled appointment time.    You need to re-schedule your appointment should you arrive 10 or more minutes late.  We strive to give you quality time with our providers, and arriving late affects you and other patients whose appointments are after yours.  Also, if you no show three or more times for appointments you may be dismissed from the clinic at the providers discretion.     Again, thank you for choosing [facility].  Our hope is that these requests will decrease the amount of time that you wait before being seen by our physicians.       _____________________________________________________________  Should you have questions after your visit to [facility], please contact our office at [phone] between the hours of 8:30 a.m. and 4:30 p.m.  Voicemails left after 4:30 p.m. will not be returned until the following business day.  For prescription refill requests, have your pharmacy contact our office.

## 2015-01-23 LAB — BETA 2 MICROGLOBULIN, SERUM: Beta-2 Microglobulin: 6.9 mg/L — ABNORMAL HIGH (ref 0.6–2.4)

## 2015-01-23 LAB — KAPPA/LAMBDA LIGHT CHAINS
Kappa free light chain: 126.89 mg/L — ABNORMAL HIGH (ref 3.30–19.40)
Kappa, lambda light chain ratio: 1.77 — ABNORMAL HIGH (ref 0.26–1.65)
Lambda free light chains: 71.49 mg/L — ABNORMAL HIGH (ref 5.71–26.30)

## 2015-01-23 NOTE — Progress Notes (Signed)
Labs drawn

## 2015-01-24 LAB — MULTIPLE MYELOMA PANEL, SERUM
ALPHA 1: 0.3 g/dL (ref 0.0–0.4)
Albumin SerPl Elph-Mcnc: 3.2 g/dL (ref 2.9–4.4)
Albumin/Glob SerPl: 0.9 (ref 0.7–1.7)
Alpha2 Glob SerPl Elph-Mcnc: 0.7 g/dL (ref 0.4–1.0)
B-Globulin SerPl Elph-Mcnc: 1 g/dL (ref 0.7–1.3)
Gamma Glob SerPl Elph-Mcnc: 1.8 g/dL (ref 0.4–1.8)
Globulin, Total: 3.9 g/dL (ref 2.2–3.9)
IGG (IMMUNOGLOBIN G), SERUM: 1824 mg/dL — AB (ref 700–1600)
IgA: 359 mg/dL (ref 64–422)
IgM, Serum: 65 mg/dL (ref 26–217)
Total Protein ELP: 7.1 g/dL (ref 6.0–8.5)

## 2015-01-27 ENCOUNTER — Emergency Department (HOSPITAL_COMMUNITY)
Admission: EM | Admit: 2015-01-27 | Discharge: 2015-01-28 | Disposition: A | Discharge status: Home / Self Care | Payer: Medicare Other | Attending: Emergency Medicine | Admitting: Emergency Medicine

## 2015-01-27 ENCOUNTER — Emergency Department (HOSPITAL_COMMUNITY): Payer: Medicare Other

## 2015-01-27 ENCOUNTER — Encounter (HOSPITAL_COMMUNITY): Payer: Self-pay | Admitting: Emergency Medicine

## 2015-01-27 DIAGNOSIS — Y92128 Other place in nursing home as the place of occurrence of the external cause: Secondary | ICD-10-CM | POA: Insufficient documentation

## 2015-01-27 DIAGNOSIS — S79912A Unspecified injury of left hip, initial encounter: Secondary | ICD-10-CM | POA: Diagnosis not present

## 2015-01-27 DIAGNOSIS — Z8639 Personal history of other endocrine, nutritional and metabolic disease: Secondary | ICD-10-CM | POA: Diagnosis not present

## 2015-01-27 DIAGNOSIS — Z7982 Long term (current) use of aspirin: Secondary | ICD-10-CM | POA: Insufficient documentation

## 2015-01-27 DIAGNOSIS — Y998 Other external cause status: Secondary | ICD-10-CM | POA: Diagnosis not present

## 2015-01-27 DIAGNOSIS — Z87891 Personal history of nicotine dependence: Secondary | ICD-10-CM | POA: Diagnosis not present

## 2015-01-27 DIAGNOSIS — W19XXXA Unspecified fall, initial encounter: Secondary | ICD-10-CM | POA: Insufficient documentation

## 2015-01-27 DIAGNOSIS — I2510 Atherosclerotic heart disease of native coronary artery without angina pectoris: Secondary | ICD-10-CM | POA: Insufficient documentation

## 2015-01-27 DIAGNOSIS — Y9389 Activity, other specified: Secondary | ICD-10-CM | POA: Diagnosis not present

## 2015-01-27 DIAGNOSIS — I509 Heart failure, unspecified: Secondary | ICD-10-CM | POA: Diagnosis not present

## 2015-01-27 DIAGNOSIS — Z79899 Other long term (current) drug therapy: Secondary | ICD-10-CM | POA: Diagnosis not present

## 2015-01-27 DIAGNOSIS — S339XXA Sprain of unspecified parts of lumbar spine and pelvis, initial encounter: Secondary | ICD-10-CM | POA: Insufficient documentation

## 2015-01-27 DIAGNOSIS — F0390 Unspecified dementia without behavioral disturbance: Secondary | ICD-10-CM | POA: Insufficient documentation

## 2015-01-27 DIAGNOSIS — S5012XA Contusion of left forearm, initial encounter: Secondary | ICD-10-CM | POA: Insufficient documentation

## 2015-01-27 DIAGNOSIS — S0003XA Contusion of scalp, initial encounter: Secondary | ICD-10-CM | POA: Diagnosis not present

## 2015-01-27 DIAGNOSIS — S335XXA Sprain of ligaments of lumbar spine, initial encounter: Secondary | ICD-10-CM

## 2015-01-27 DIAGNOSIS — N183 Chronic kidney disease, stage 3 (moderate): Secondary | ICD-10-CM | POA: Diagnosis not present

## 2015-01-27 DIAGNOSIS — I480 Paroxysmal atrial fibrillation: Secondary | ICD-10-CM | POA: Insufficient documentation

## 2015-01-27 DIAGNOSIS — I129 Hypertensive chronic kidney disease with stage 1 through stage 4 chronic kidney disease, or unspecified chronic kidney disease: Secondary | ICD-10-CM | POA: Diagnosis not present

## 2015-01-27 DIAGNOSIS — Z8673 Personal history of transient ischemic attack (TIA), and cerebral infarction without residual deficits: Secondary | ICD-10-CM | POA: Insufficient documentation

## 2015-01-27 DIAGNOSIS — S0990XA Unspecified injury of head, initial encounter: Secondary | ICD-10-CM | POA: Diagnosis present

## 2015-01-27 NOTE — ED Provider Notes (Signed)
CSN: [id number]   Arrival date & time 01/27/15 2145  History  This chart was scribed for  Davonna Belling, MD by Altamease Oiler, ED Scribe. This patient was seen in room APA18/APA18 and the patient's care was started at 11:19 PM.  Chief Complaint  Patient presents with  . Fall    HPI The history is provided by the patient and a relative. No language interpreter was used.   Brandy Mcguire is a 81 y.o. female with PMHx of dementia who presents to the Emergency Department complaining of an unwitnessed fall tonight at [facility]. She was transferring to her wheelchair and slid to the ground. Associated symptoms include a painful "knot" at the top of the head, left arm pain, and left hip pain. Pt is at baseline mental status per son. Non-ambulatory at baseline.  Pt is on Plavix.   Past Medical History  Diagnosis Date  . Stroke   . CAD (coronary artery disease)   . Hypertension   . Bite from cat 03/17/2011  . CHF (congestive heart failure)   . PAF (paroxysmal atrial fibrillation)   . Hypothyroidism   . Dementia   . CKD (chronic kidney disease) stage 3, GFR 30-59 ml/min 09/28/2014  . Thoracoabdominal aortic aneurysm 09/29/2014    6.4 x 6.0 cm; previously 4.2 cm.  . Traumatic hematoma of buttock 09/28/2014  . A-fib     Past Surgical History  Procedure Laterality Date  . Stents       kidneys and 2 in heart  . Abdominal hysterectomy      Family History  Problem Relation Age of Onset  . Cancer Mother   . Cancer Sister   . COPD Sister   . Heart failure Sister     History  Substance Use Topics  . Smoking status: Former Smoker -- 1.00 packs/day for 50 years    Types: Cigarettes    Quit date: 04/03/2001  . Smokeless tobacco: Never Used  . Alcohol Use: No     Review of Systems  HENT:       Painful "knot" at the top of the head  Musculoskeletal:       Left arm pain Left hip pain  All other systems reviewed and are negative.    Home Medications   Prior to Admission  medications   Medication Sig Start Date End Date Taking? Authorizing Provider  atorvastatin (LIPITOR) 40 MG tablet Take 40 mg by mouth at bedtime.    Historical Provider, MD  clopidogrel (PLAVIX) 75 MG tablet Take 1 tablet (75 mg total) by mouth at bedtime. Resume in 1 week if hemoglobin stable 10/03/14   Kathie Dike, MD  diltiazem (CARDIZEM) 30 MG tablet Take 1 tablet (30 mg total) by mouth every 8 (eight) hours. 10/03/14   Kathie Dike, MD  ENSURE (ENSURE) Take 237 mLs by mouth 2 (two) times daily between meals.    Historical Provider, MD  fluconazole (DIFLUCAN) 100 MG tablet Take 1 tablet (100 mg total) by mouth daily. Patient not taking: Reported on 01/22/2015 12/29/14   Erline Hau, MD  Furosemide (LASIX PO) Take 10 mg by mouth daily.    Historical Provider, MD  ipratropium-albuterol (DUONEB) 0.5-2.5 (3) MG/3ML SOLN Take 3 mLs by nebulization every 6 (six) hours. 10/03/14   Kathie Dike, MD  senna-docusate (SENOKOT-S) 8.6-50 MG per tablet Take 1 tablet by mouth daily.    Historical Provider, MD  tamsulosin (FLOMAX) 0.4 MG CAPS capsule Take 1 capsule (0.4 mg  total) by mouth daily after supper. 12/16/14   Radene Gunning, NP  traZODone (DESYREL) 50 MG tablet Take 50 mg by mouth at bedtime.      Historical Provider, MD    Allergies  Review of patient's allergies indicates no known allergies.  Triage Vitals: BP 123/95 mmHg  Pulse 80  Temp(Src) 97.5 F (36.4 C) (Oral)  Resp 18  Wt 162 lb (73.483 kg)  SpO2 91%  Physical Exam  Constitutional: She appears well-developed and well-nourished. No distress.  HENT:  Head: Normocephalic.  A 3 cm hematoma to the right occipital parietal area  Eyes: EOM are normal. Pupils are equal, round, and reactive to light.  Neck: Normal range of motion.  Cardiovascular: Normal rate.   Pulmonary/Chest: Effort normal and breath sounds normal.  Musculoskeletal:  No bony deformity Slight lower lumbar tenderness Slight left lateral hip  tenderness FROM at both hips Small hematoma to left forearm with no underlying tenderness  Neurological: She is alert.  Skin: Skin is warm and dry. She is not diaphoretic.  Psychiatric: She has a normal mood and affect.  Nursing note and vitals reviewed.   ED Course  Procedures  COORDINATION OF CARE: 11:24 PM Discussed treatment plan which includes CT head without contrast, lumbar spine XR, and left hip XR with pt at bedside and pt agreed to plan.  Results for orders placed or performed in visit on 01/22/15  CBC with Differential  Result Value Ref Range   WBC 5.0 4.0 - 10.5 K/uL   RBC 4.28 3.87 - 5.11 MIL/uL   Hemoglobin 12.6 12.0 - 15.0 g/dL   HCT 39.9 36.0 - 46.0 %   MCV 93.2 78.0 - 100.0 fL   MCH 29.4 26.0 - 34.0 pg   MCHC 31.6 30.0 - 36.0 g/dL   RDW 17.0 (H) 11.5 - 15.5 %   Platelets 230 150 - 400 K/uL   Neutrophils Relative % 53 43 - 77 %   Neutro Abs 2.7 1.7 - 7.7 K/uL   Lymphocytes Relative 33 12 - 46 %   Lymphs Abs 1.6 0.7 - 4.0 K/uL   Monocytes Relative 14 (H) 3 - 12 %   Monocytes Absolute 0.7 0.1 - 1.0 K/uL   Eosinophils Relative 0 0 - 5 %   Eosinophils Absolute 0.0 0.0 - 0.7 K/uL   Basophils Relative 0 0 - 1 %   Basophils Absolute 0.0 0.0 - 0.1 K/uL  Vitamin B12  Result Value Ref Range   Vitamin B-12 689 180 - 914 pg/mL  Folate  Result Value Ref Range   Folate 8.9 >5.9 ng/mL  Iron and TIBC  Result Value Ref Range   Iron 40 28 - 170 ug/dL   TIBC 182 (L) 250 - 450 ug/dL   Saturation Ratios 22 10.4 - 31.8 %   UIBC 142 ug/dL  Ferritin  Result Value Ref Range   Ferritin 153 11 - 307 ng/mL  Reticulocytes  Result Value Ref Range   Retic Ct Pct 2.0 0.4 - 3.1 %   RBC. 4.28 3.87 - 5.11 MIL/uL   Retic Count, Manual 85.6 19.0 - 186.0 K/uL  Sedimentation rate  Result Value Ref Range   Sed Rate 12 0 - 22 mm/hr  C-reactive protein  Result Value Ref Range   CRP 4.6 (H) <1.0 mg/dL  Beta 2 microglobuline, serum  Result Value Ref Range   Beta-2 Microglobulin  6.9 (H) 0.6 - 2.4 mg/L  Multiple myeloma panel, serum  Result Value Ref Range  IgG (Immunoglobin G), Serum 1824 (H) 700 - 1600 mg/dL   IgA 359 64 - 422 mg/dL   IgM, Serum 65 26 - 217 mg/dL   Total Protein ELP 7.1 6.0 - 8.5 g/dL   Albumin SerPl Elph-Mcnc 3.2 2.9 - 4.4 g/dL   Alpha 1 0.3 0.0 - 0.4 g/dL   Alpha2 Glob SerPl Elph-Mcnc 0.7 0.4 - 1.0 g/dL   B-Globulin SerPl Elph-Mcnc 1.0 0.7 - 1.3 g/dL   Gamma Glob SerPl Elph-Mcnc 1.8 0.4 - 1.8 g/dL   M Protein SerPl Elph-Mcnc Not Observed Not Observed g/dL   Globulin, Total 3.9 2.2 - 3.9 g/dL   Albumin/Glob SerPl 0.9 0.7 - 1.7   IFE 1 Comment    Please Note Comment   Kappa/lambda light chains  Result Value Ref Range   Kappa free light chain 126.89 (H) 3.30 - 19.40 mg/L   Lamda free light chains 71.49 (H) 5.71 - 26.30 mg/L   Kappa, lamda light chain ratio 1.77 (H) 0.26 - 1.65    EKG Interpretation None     MDM   Final diagnoses:  Fall, initial encounter  Scalp contusion, initial encounter  Lumbar sprain, initial encounter   Patient with fall. Scalp hematoma. On Plavix. PCP may need to reevaluate her anticoagulation with her falls. Negative imaging. Will discharge home. I personally performed the services described in this documentation, which was scribed in my presence. The recorded information has been reviewed and is accurate.      Davonna Belling, MD 01/28/15 [phone]

## 2015-01-27 NOTE — ED Notes (Signed)
Unwitnessed fall at nursing home, [facility]. Per EMS pt has knot on top of her head.

## 2015-01-28 NOTE — Discharge Instructions (Signed)
Facial or Scalp Contusion A facial or scalp contusion is a deep bruise on the face or head. Injuries to the face and head generally cause a lot of swelling, especially around the eyes. Contusions are the result of an injury that caused bleeding under the skin. The contusion may turn blue, purple, or yellow. Minor injuries will give you a painless contusion, but more severe contusions may stay painful and swollen for a few weeks.  CAUSES  A facial or scalp contusion is caused by a blunt injury or trauma to the face or head area.  SIGNS AND SYMPTOMS   Swelling of the injured area.   Discoloration of the injured area.   Tenderness, soreness, or pain in the injured area.  DIAGNOSIS  The diagnosis can be made by taking a medical history and doing a physical exam. An X-ray exam, CT scan, or MRI may be needed to determine if there are any associated injuries, such as broken bones (fractures). TREATMENT  Often, the best treatment for a facial or scalp contusion is applying cold compresses to the injured area. Over-the-counter medicines may also be recommended for pain control.  HOME CARE INSTRUCTIONS   Only take over-the-counter or prescription medicines as directed by your health care provider.   Apply ice to the injured area.   Put ice in a plastic bag.   Place a towel between your skin and the bag.   Leave the ice on for 20 minutes, 2-3 times a day.  SEEK MEDICAL CARE IF:  You have bite problems.   You have pain with chewing.   You are concerned about facial defects. SEEK IMMEDIATE MEDICAL CARE IF:  You have severe pain or a headache that is not relieved by medicine.   You have unusual sleepiness, confusion, or personality changes.   You throw up (vomit).   You have a persistent nosebleed.   You have double vision or blurred vision.   You have fluid drainage from your nose or ear.   You have difficulty walking or using your arms or legs.  MAKE SURE YOU:    Understand these instructions.  Will watch your condition.  Will get help right away if you are not doing well or get worse. Document Released: 07/22/2004 Document Revised: 04/04/2013 Document Reviewed: 01/25/2013 Exit[employer] Patient Information 2015 [employer]. This information is not intended to replace advice given to you by your health care provider. Make sure you discuss any questions you have with your health care provider.  

## 2015-01-28 NOTE — ED Notes (Signed)
E-signature not working. 

## 2015-02-19 ENCOUNTER — Encounter (HOSPITAL_BASED_OUTPATIENT_CLINIC_OR_DEPARTMENT_OTHER): Payer: No Typology Code available for payment source

## 2015-02-19 ENCOUNTER — Encounter (HOSPITAL_COMMUNITY): Payer: Self-pay | Admitting: Hematology & Oncology

## 2015-02-19 ENCOUNTER — Encounter (HOSPITAL_COMMUNITY): Payer: No Typology Code available for payment source | Attending: Oncology | Admitting: Hematology & Oncology

## 2015-02-19 VITALS — BP 89/56 | HR 110 | Temp 97.5°F | Resp 16 | Wt 146.2 lb

## 2015-02-19 DIAGNOSIS — D649 Anemia, unspecified: Secondary | ICD-10-CM | POA: Insufficient documentation

## 2015-02-19 DIAGNOSIS — D696 Thrombocytopenia, unspecified: Secondary | ICD-10-CM | POA: Insufficient documentation

## 2015-02-19 LAB — CBC WITH DIFFERENTIAL/PLATELET
Basophils Absolute: 0 10*3/uL (ref 0.0–0.1)
Basophils Relative: 0 % (ref 0–1)
Eosinophils Absolute: 0.2 10*3/uL (ref 0.0–0.7)
Eosinophils Relative: 4 % (ref 0–5)
HEMATOCRIT: 38.7 % (ref 36.0–46.0)
HEMOGLOBIN: 12.5 g/dL (ref 12.0–15.0)
Lymphocytes Relative: 26 % (ref 12–46)
Lymphs Abs: 1.4 10*3/uL (ref 0.7–4.0)
MCH: 30.1 pg (ref 26.0–34.0)
MCHC: 32.3 g/dL (ref 30.0–36.0)
MCV: 93.3 fL (ref 78.0–100.0)
MONO ABS: 0.3 10*3/uL (ref 0.1–1.0)
MONOS PCT: 6 % (ref 3–12)
NEUTROS PCT: 64 % (ref 43–77)
Neutro Abs: 3.3 10*3/uL (ref 1.7–7.7)
Platelets: 169 10*3/uL (ref 150–400)
RBC: 4.15 MIL/uL (ref 3.87–5.11)
RDW: 18.3 % — ABNORMAL HIGH (ref 11.5–15.5)
WBC: 5.3 10*3/uL (ref 4.0–10.5)

## 2015-02-19 NOTE — Patient Instructions (Signed)
..  [place] at [facility] Discharge Instructions  RECOMMENDATIONS MADE BY THE CONSULTANT AND ANY TEST RESULTS WILL BE SENT TO YOUR REFERRING PHYSICIAN.  Labs today and return in 2 months   Thank you for choosing [place] at [facility] to provide your oncology and hematology care.  To afford each patient quality time with our provider, please arrive at least 15 minutes before your scheduled appointment time.    You need to re-schedule your appointment should you arrive 10 or more minutes late.  We strive to give you quality time with our providers, and arriving late affects you and other patients whose appointments are after yours.  Also, if you no show three or more times for appointments you may be dismissed from the clinic at the providers discretion.     Again, thank you for choosing [facility].  Our hope is that these requests will decrease the amount of time that you wait before being seen by our physicians.       _____________________________________________________________  Should you have questions after your visit to [facility], please contact our office at [phone] between the hours of 8:30 a.m. and 4:30 p.m.  Voicemails left after 4:30 p.m. will not be returned until the following business day.  For prescription refill requests, have your pharmacy contact our office.

## 2015-02-19 NOTE — Progress Notes (Signed)
cbcd drawn; Everything else has been canceled as dupl per Tammy R.

## 2015-02-19 NOTE — Progress Notes (Signed)
[facility] Hematology/Oncology Progress Note  Name: Brandy Mcguire      MRN: [id number]        REFERRING PHYSICIAN:  Dr. Maudie Mercury  REASON FOR CONSULT:  Thrombocytopenia   DIAGNOSIS:  Thrombocytopenia, anemia  HISTORY OF PRESENT ILLNESS:   Brandy Mcguire is a 81 year old white American woman with a past history significant for recurrent UTI, AAA, H/O sepsis, A-fib, hypothyroidism, failure to thrive, dementia, CKD stage 3, CAD, and anemia who is referred to [facility] for thrombocytopenia.  She is noted to have two episodes of thrombocytopenia, one in April and another in July when she was admitted to the hospital on both occassions with Sepsis.  She was given Lovenox and Heparin during her June/July hospitalization (on my review of her chart).  Outside of these two hospitalizations within [facility], platelet count has been WNL and stable.  I do not see episodes of significant thrombocytopenia. I have reviewed a dictation from Dr. Maudie Mercury, dated 01/15/2015 reported resolution of thrombocytopenia, leukopenia, and improved anemia with a Hgb of 11.3 g/dL. She has documented Stage III chronic kidney disease.  Brandy Mcguire denies any blood in stool, and dark sticky stools.  She reports that she feels at her baseline.  She denies any hemoptysis, and hematuria.  She denies any new rashes.  She reports that her appetite is "fair."  She notes that she does not eat all of her meals.  "I have some food left over on my plate."  She has 5 children, 4 boys, and 1 girl.  She used to work as a Writer.  She reports that she does walk at the nursing home with minimal assistance.   The patient is he here for the results of her recent blood work.    PAST MEDICAL HISTORY:   Past Medical History  Diagnosis Date  . Stroke   . CAD (coronary artery disease)   . Hypertension   . Bite from cat 03/17/2011  . CHF (congestive heart failure)   . PAF (paroxysmal atrial fibrillation)   . Hypothyroidism   . Dementia     . CKD (chronic kidney disease) stage 3, GFR 30-59 ml/min 09/28/2014  . Thoracoabdominal aortic aneurysm 09/29/2014    6.4 x 6.0 cm; previously 4.2 cm.  . Traumatic hematoma of buttock 09/28/2014  . A-fib   . Renal artery stenosis     Status post left renal stent.  . Multiple bilateral plumonary nodules  06/07/2011  . Sepsis due to enterococcus 12/10/2014  . Chronic diastolic CHF (congestive heart failure) 03/15/2015    EF 60-65%  . Pulmonary hypertension 03/15/2015    43 mm Hg    ALLERGIES: No Known Allergies    MEDICATIONS: I have reviewed the patient's current medications.    Current Outpatient Prescriptions on File Prior to Visit  Medication Sig Dispense Refill  . atorvastatin (LIPITOR) 40 MG tablet Take 40 mg by mouth at bedtime.    . clopidogrel (PLAVIX) 75 MG tablet Take 1 tablet (75 mg total) by mouth at bedtime. Resume in 1 week if hemoglobin stable (Patient taking differently: Take 75 mg by mouth at bedtime. )    . ipratropium-albuterol (DUONEB) 0.5-2.5 (3) MG/3ML SOLN Take 3 mLs by nebulization every 6 (six) hours. 360 mL   . senna-docusate (SENOKOT-S) 8.6-50 MG per tablet Take 1 tablet by mouth daily.    . tamsulosin (FLOMAX) 0.4 MG CAPS capsule Take 1 capsule (0.4 mg total) by mouth daily  after supper. 30 capsule 1  . traZODone (DESYREL) 50 MG tablet Take 50 mg by mouth at bedtime as needed.      No current facility-administered medications on file prior to visit.     PAST SURGICAL HISTORY Past Surgical History  Procedure Laterality Date  . Stents       kidneys and 2 in heart  . Abdominal hysterectomy      FAMILY HISTORY: Family History  Problem Relation Age of Onset  . Cancer Mother   . Cancer Sister   . COPD Sister   . Heart failure Sister     SOCIAL HISTORY:  reports that she quit smoking about 13 years ago. Her smoking use included Cigarettes. She has a 50 pack-year smoking history. She has never used smokeless tobacco. She reports that she does not drink  alcohol or use illicit drugs.  PERFORMANCE STATUS: The patient's performance status is 3 - Symptomatic, >50% confined to bed   PHYSICAL EXAM: Most Recent Vital Signs: Blood pressure 89/56, pulse 110, temperature 97.5 F (36.4 C), resp. rate 16, weight 146 lb 3.2 oz (66.316 kg), SpO2 100 %. General appearance: alert, cooperative, appears stated age, no distress and in wheelchair, accompanied by aid from nursing facility. Head: Normocephalic, without obvious abnormality, atraumatic Eyes: negative findings: lids and lashes normal, conjunctivae and sclerae normal and corneas clear Neck: no adenopathy and supple, symmetrical, trachea midline Lungs: clear to auscultation bilaterally Heart: irregularly irregular rate and rhythm, S1, S2 normal, no murmur, click, rub or gallop Abdomen: soft, non-tender; bowel sounds normal; no masses,  no organomegaly Extremities: extremities normal, atraumatic, no cyanosis or edema, bilateral lower extremity varicose veins L>R. Skin: Skin color, texture, turgor normal. No rashes or lesions Lymph nodes: Cervical, supraclavicular, and axillary nodes normal. Neurologic: Grossly normal  LABORATORY DATA:  I have reviewed the results below  Results for Brandy, Mcguire (MRN [id number])   Ref. Range 01/22/2015 13:31  Iron Latest Ref Range: 28-170 ug/dL 40  UIBC Latest Units: ug/dL 142  TIBC Latest Ref Range: 250-450 ug/dL 182 (L)  Saturation Ratios Latest Ref Range: 10.4-31.8 % 22  Ferritin Latest Ref Range: 11-307 ng/mL 153  Folate Latest Ref Range: >5.9 ng/mL 8.9  CRP Latest Ref Range: <1.0 mg/dL 4.6 (H)  Vitamin B-12 Latest Ref Range: 180-914 pg/mL 689  Total Protein ELP Latest Ref Range: 6.0-8.5 g/dL 7.1  Albumin SerPl Elph-Mcnc Latest Ref Range: 2.9-4.4 g/dL 3.2  Albumin/Glob SerPl Latest Ref Range: 0.7-1.7  0.9  Alpha2 Glob SerPl Elph-Mcnc Latest Ref Range: 0.4-1.0 g/dL 0.7  Alpha 1 Latest Ref Range: 0.0-0.4 g/dL 0.3  Gamma Glob SerPl Elph-Mcnc Latest  Ref Range: 0.4-1.8 g/dL 1.8  IFE 1 Unknown Comment  Globulin, Total Latest Ref Range: 2.2-3.9 g/dL 3.9  B-Globulin SerPl Elph-Mcnc Latest Ref Range: 0.7-1.3 g/dL 1.0  IgG (Immunoglobin G), Serum Latest Ref Range: 684-875-1153 mg/dL 1824 (H)  IgA Latest Ref Range: 64-422 mg/dL 359  IgM, Serum Latest Ref Range: 26-217 mg/dL 65  M Protein SerPl Elph-Mcnc  Latest Ref Range: Not Observed g/dL Not Observed    Results for JENNINE, PEDDY (MRN [id number])   Ref. Range 01/22/2015 13:30  Beta-2 Microglobulin Latest Ref Range: 0.6-2.4 mg/L 6.9 (H)  Kappa free light chain Latest Ref Range: 3.30-19.40 mg/L 126.89 (H)  Lamda free light chains Latest Ref Range: 5.71-26.30 mg/L 71.49 (H)  Kappa, lamda light chain ratio Latest Ref Range: 0.26-1.65  1.77 (H)  WBC Latest Ref Range: 4.0-10.5 K/uL 5.0  RBC Latest Ref  Range: 3.87-5.11 MIL/uL 4.28  Hemoglobin Latest Ref Range: 12.0-15.0 g/dL 12.6  HCT Latest Ref Range: 36.0-46.0 % 39.9  MCV Latest Ref Range: 78.0-100.0 fL 93.2  MCH Latest Ref Range: 26.0-34.0 pg 29.4  MCHC Latest Ref Range: 30.0-36.0 g/dL 31.6  RDW Latest Ref Range: 11.5-15.5 % 17.0 (H)  Platelets Latest Ref Range: 150-400 K/uL 230  Neutrophils Latest Ref Range: 43-77 % 53  Lymphocytes Latest Ref Range: 12-46 % 33  Monocytes Relative Latest Ref Range: 3-12 % 14 (H)  Eosinophil Latest Ref Range: 0-5 % 0  Basophil Latest Ref Range: 0-1 % 0  NEUT# Latest Ref Range: 1.7-7.7 K/uL 2.7  Lymphocyte # Latest Ref Range: 0.7-4.0 K/uL 1.6  Monocyte # Latest Ref Range: 0.1-1.0 K/uL 0.7  Eosinophils Absolute Latest Ref Range: 0.0-0.7 K/uL 0.0  Basophils Absolute Latest Ref Range: 0.0-0.1 K/uL 0.0  RBC. Latest Ref Range: 3.87-5.11 MIL/uL 4.28  Retic Ct Pct Latest Ref Range: 0.4-3.1 % 2.0  Retic Count, Manual Latest Ref Range: 19.0-186.0 K/uL 85.6  Sed Rate Latest Ref Range: 0-22 mm/hr 12     PATHOLOGY:  None  ASSESSMENT:  1. Thrombocytopenia, resolved  2. Normocytic, normochromic, anemia,  improved  3. Stage III Chronic Renal Disease 4. Dementia 5. Recurrent UTIs  6. AAA 7. H/O sepsis with hospitalizations in April 2016 and June/July 2016, at which time thrombocytopenia was documented. 8. A-fib 9. Hypothyroidism, 10. Failure to thrive 11. CAD   PLAN:  CBC was normal on repeat here. She has an abnormal kappa lambda light chain ratio which is most likely secondary from her chronic kidney disease. The remainder of her laboratory studies were unremarkable except for an elevation of CRP. This is currently of uncertain significance. Iron studies, W10 and folic acid were all within normal limits. Currently I just recommended ongoing observation. We will bring her back in several months with a repeat CBC. If her counts remain normal we will formally discharge her, she could return at any time should her blood counts change.  All questions were answered. The patient knows to call the clinic with any problems, questions or concerns. We can certainly see the patient much sooner if necessary.   This document serves as a record of services personally performed by Ancil Linsey, MD. It was created on her behalf by Janace Hoard, a trained medical scribe. The creation of this record is based on the scribe's personal observations and the provider's statements to them. This document has been checked and approved by the attending provider.  I have reviewed the above documentation for accuracy and completeness, and I agree with the above.  This note is electronically signed.  Kelby Fam. Whitney Muse, MD

## 2015-03-08 ENCOUNTER — Encounter (HOSPITAL_COMMUNITY): Payer: Self-pay | Admitting: Emergency Medicine

## 2015-03-08 ENCOUNTER — Inpatient Hospital Stay (HOSPITAL_COMMUNITY)
Admission: EM | Admit: 2015-03-08 | Discharge: 2015-03-15 | DRG: 683 | Disposition: A | Discharge status: Skilled Nursing Facility | Payer: Medicare Other | Attending: Internal Medicine | Admitting: Internal Medicine

## 2015-03-08 DIAGNOSIS — N319 Neuromuscular dysfunction of bladder, unspecified: Secondary | ICD-10-CM | POA: Diagnosis present

## 2015-03-08 DIAGNOSIS — F0390 Unspecified dementia without behavioral disturbance: Secondary | ICD-10-CM | POA: Diagnosis present

## 2015-03-08 DIAGNOSIS — I129 Hypertensive chronic kidney disease with stage 1 through stage 4 chronic kidney disease, or unspecified chronic kidney disease: Secondary | ICD-10-CM | POA: Diagnosis present

## 2015-03-08 DIAGNOSIS — I5032 Chronic diastolic (congestive) heart failure: Secondary | ICD-10-CM | POA: Diagnosis present

## 2015-03-08 DIAGNOSIS — I10 Essential (primary) hypertension: Secondary | ICD-10-CM | POA: Diagnosis present

## 2015-03-08 DIAGNOSIS — R339 Retention of urine, unspecified: Secondary | ICD-10-CM | POA: Diagnosis present

## 2015-03-08 DIAGNOSIS — R0602 Shortness of breath: Secondary | ICD-10-CM

## 2015-03-08 DIAGNOSIS — R338 Other retention of urine: Secondary | ICD-10-CM | POA: Diagnosis present

## 2015-03-08 DIAGNOSIS — E876 Hypokalemia: Secondary | ICD-10-CM | POA: Diagnosis present

## 2015-03-08 DIAGNOSIS — F05 Delirium due to known physiological condition: Secondary | ICD-10-CM | POA: Diagnosis present

## 2015-03-08 DIAGNOSIS — I272 Other secondary pulmonary hypertension: Secondary | ICD-10-CM | POA: Diagnosis present

## 2015-03-08 DIAGNOSIS — D649 Anemia, unspecified: Secondary | ICD-10-CM | POA: Diagnosis present

## 2015-03-08 DIAGNOSIS — E860 Dehydration: Secondary | ICD-10-CM | POA: Diagnosis present

## 2015-03-08 DIAGNOSIS — Z87891 Personal history of nicotine dependence: Secondary | ICD-10-CM

## 2015-03-08 DIAGNOSIS — Z9861 Coronary angioplasty status: Secondary | ICD-10-CM

## 2015-03-08 DIAGNOSIS — N390 Urinary tract infection, site not specified: Secondary | ICD-10-CM | POA: Diagnosis present

## 2015-03-08 DIAGNOSIS — N183 Chronic kidney disease, stage 3 unspecified: Secondary | ICD-10-CM | POA: Diagnosis present

## 2015-03-08 DIAGNOSIS — E039 Hypothyroidism, unspecified: Secondary | ICD-10-CM | POA: Diagnosis present

## 2015-03-08 DIAGNOSIS — N184 Chronic kidney disease, stage 4 (severe): Secondary | ICD-10-CM | POA: Diagnosis present

## 2015-03-08 DIAGNOSIS — I2510 Atherosclerotic heart disease of native coronary artery without angina pectoris: Secondary | ICD-10-CM | POA: Diagnosis present

## 2015-03-08 DIAGNOSIS — I509 Heart failure, unspecified: Secondary | ICD-10-CM

## 2015-03-08 DIAGNOSIS — D696 Thrombocytopenia, unspecified: Secondary | ICD-10-CM | POA: Diagnosis present

## 2015-03-08 DIAGNOSIS — T8351XA Infection and inflammatory reaction due to indwelling urinary catheter, initial encounter: Secondary | ICD-10-CM | POA: Diagnosis present

## 2015-03-08 DIAGNOSIS — N179 Acute kidney failure, unspecified: Principal | ICD-10-CM | POA: Diagnosis present

## 2015-03-08 DIAGNOSIS — Z8673 Personal history of transient ischemic attack (TIA), and cerebral infarction without residual deficits: Secondary | ICD-10-CM | POA: Diagnosis not present

## 2015-03-08 DIAGNOSIS — Y846 Urinary catheterization as the cause of abnormal reaction of the patient, or of later complication, without mention of misadventure at the time of the procedure: Secondary | ICD-10-CM | POA: Diagnosis present

## 2015-03-08 DIAGNOSIS — I480 Paroxysmal atrial fibrillation: Secondary | ICD-10-CM | POA: Diagnosis present

## 2015-03-08 HISTORY — DX: Other nonspecific abnormal finding of lung field: R91.8

## 2015-03-08 HISTORY — DX: Sepsis due to Enterococcus: A41.81

## 2015-03-08 HISTORY — DX: Pulmonary hypertension, unspecified: I27.20

## 2015-03-08 HISTORY — DX: Atherosclerosis of renal artery: I70.1

## 2015-03-08 HISTORY — DX: Chronic diastolic (congestive) heart failure: I50.32

## 2015-03-08 LAB — CBC WITH DIFFERENTIAL/PLATELET
Basophils Absolute: 0 10*3/uL (ref 0.0–0.1)
Basophils Relative: 0 % (ref 0–1)
EOS PCT: 5 % (ref 0–5)
Eosinophils Absolute: 0.3 10*3/uL (ref 0.0–0.7)
HCT: 33.2 % — ABNORMAL LOW (ref 36.0–46.0)
Hemoglobin: 10.9 g/dL — ABNORMAL LOW (ref 12.0–15.0)
LYMPHS ABS: 1.1 10*3/uL (ref 0.7–4.0)
Lymphocytes Relative: 24 % (ref 12–46)
MCH: 30.8 pg (ref 26.0–34.0)
MCHC: 32.8 g/dL (ref 30.0–36.0)
MCV: 93.8 fL (ref 78.0–100.0)
Monocytes Absolute: 0.5 10*3/uL (ref 0.1–1.0)
Monocytes Relative: 10 % (ref 3–12)
NEUTROS ABS: 2.9 10*3/uL (ref 1.7–7.7)
Neutrophils Relative %: 61 % (ref 43–77)
PLATELETS: 127 10*3/uL — AB (ref 150–400)
RBC: 3.54 MIL/uL — ABNORMAL LOW (ref 3.87–5.11)
RDW: 17.7 % — ABNORMAL HIGH (ref 11.5–15.5)
WBC: 4.7 10*3/uL (ref 4.0–10.5)

## 2015-03-08 LAB — BASIC METABOLIC PANEL
ANION GAP: 7 (ref 5–15)
BUN: 23 mg/dL — AB (ref 6–20)
CHLORIDE: 97 mmol/L — AB (ref 101–111)
CO2: 31 mmol/L (ref 22–32)
Calcium: 8.1 mg/dL — ABNORMAL LOW (ref 8.9–10.3)
Creatinine, Ser: 4.88 mg/dL — ABNORMAL HIGH (ref 0.44–1.00)
GFR calc Af Amer: 9 mL/min — ABNORMAL LOW (ref >60)
GFR calc non Af Amer: 8 mL/min — ABNORMAL LOW (ref >60)
GLUCOSE: 91 mg/dL (ref 65–99)
Potassium: 4.2 mmol/L (ref 3.5–5.1)
Sodium: 135 mmol/L (ref 135–145)

## 2015-03-08 LAB — URINALYSIS, ROUTINE W REFLEX MICROSCOPIC
Bilirubin Urine: NEGATIVE
GLUCOSE, UA: NEGATIVE mg/dL
KETONES UR: NEGATIVE mg/dL
NITRITE: NEGATIVE
PH: 7 (ref 5.0–8.0)
SPECIFIC GRAVITY, URINE: 1.01 (ref 1.005–1.030)
Urobilinogen, UA: 0.2 mg/dL (ref 0.0–1.0)

## 2015-03-08 LAB — TROPONIN I: TROPONIN I: 0.03 ng/mL (ref <0.031)

## 2015-03-08 LAB — URINE MICROSCOPIC-ADD ON

## 2015-03-08 LAB — I-STAT CG4 LACTIC ACID, ED: Lactic Acid, Venous: 0.94 mmol/L (ref 0.5–2.0)

## 2015-03-08 MED ORDER — ONDANSETRON HCL 4 MG PO TABS: 4.0000 mg | ORAL_TABLET | Freq: Four times a day (QID) | ORAL | Status: DC | PRN

## 2015-03-08 MED ORDER — OXYCODONE HCL 5 MG PO TABS: 5.0000 mg | ORAL_TABLET | ORAL | Status: DC | PRN

## 2015-03-08 MED ORDER — SODIUM CHLORIDE 0.9 % IV BOLUS (SEPSIS)
1000.0000 mL | Freq: Once | INTRAVENOUS | Status: AC
  Administered 2015-03-08: 1000 mL via INTRAVENOUS

## 2015-03-08 MED ORDER — ACETAMINOPHEN 650 MG RE SUPP: 650.0000 mg | Freq: Four times a day (QID) | RECTAL | Status: DC | PRN

## 2015-03-08 MED ORDER — SENNOSIDES-DOCUSATE SODIUM 8.6-50 MG PO TABS
1.0000 | ORAL_TABLET | Freq: Every day | ORAL | Status: DC
  Administered 2015-03-09 – 2015-03-15 (×7): 1 via ORAL
  Filled 2015-03-08 (×7): qty 1

## 2015-03-08 MED ORDER — ALUM & MAG HYDROXIDE-SIMETH 200-200-20 MG/5ML PO SUSP: 30.0000 mL | Freq: Four times a day (QID) | ORAL | Status: DC | PRN

## 2015-03-08 MED ORDER — HYDROMORPHONE HCL 1 MG/ML IJ SOLN: 0.5000 mg | INTRAMUSCULAR | Status: DC | PRN

## 2015-03-08 MED ORDER — SODIUM CHLORIDE 0.9 % IV SOLN
Freq: Once | INTRAVENOUS | Status: AC
  Administered 2015-03-08: 23:00:00 via INTRAVENOUS

## 2015-03-08 MED ORDER — FLUCONAZOLE 100 MG PO TABS
200.0000 mg | ORAL_TABLET | Freq: Every day | ORAL | Status: DC
  Administered 2015-03-09 – 2015-03-10 (×2): 200 mg via ORAL
  Filled 2015-03-08 (×2): qty 2

## 2015-03-08 MED ORDER — LORAZEPAM 0.5 MG PO TABS
0.2500 mg | ORAL_TABLET | Freq: Four times a day (QID) | ORAL | Status: DC | PRN
  Administered 2015-03-10 – 2015-03-13 (×4): 0.25 mg via ORAL
  Filled 2015-03-08 (×4): qty 1

## 2015-03-08 MED ORDER — CLOPIDOGREL BISULFATE 75 MG PO TABS
75.0000 mg | ORAL_TABLET | Freq: Every day | ORAL | Status: DC
  Administered 2015-03-09 – 2015-03-14 (×7): 75 mg via ORAL
  Filled 2015-03-08 (×7): qty 1

## 2015-03-08 MED ORDER — PRO-STAT SUGAR FREE PO LIQD
30.0000 mL | Freq: Every day | ORAL | Status: DC
  Administered 2015-03-09 – 2015-03-15 (×7): 30 mL via ORAL
  Filled 2015-03-08 (×7): qty 30

## 2015-03-08 MED ORDER — DEXTROSE 5 % IV SOLN
1.0000 g | Freq: Once | INTRAVENOUS | Status: AC
  Administered 2015-03-08: 1 g via INTRAVENOUS
  Filled 2015-03-08: qty 10

## 2015-03-08 MED ORDER — FLUCONAZOLE IN SODIUM CHLORIDE 200-0.9 MG/100ML-% IV SOLN
INTRAVENOUS | Status: AC
  Filled 2015-03-08: qty 100

## 2015-03-08 MED ORDER — TAMSULOSIN HCL 0.4 MG PO CAPS
0.4000 mg | ORAL_CAPSULE | Freq: Every day | ORAL | Status: DC
  Administered 2015-03-09 – 2015-03-15 (×7): 0.4 mg via ORAL
  Filled 2015-03-08 (×7): qty 1

## 2015-03-08 MED ORDER — IPRATROPIUM-ALBUTEROL 0.5-2.5 (3) MG/3ML IN SOLN
3.0000 mL | Freq: Four times a day (QID) | RESPIRATORY_TRACT | Status: DC
  Administered 2015-03-09: 3 mL via RESPIRATORY_TRACT
  Filled 2015-03-08: qty 3

## 2015-03-08 MED ORDER — IPRATROPIUM-ALBUTEROL 0.5-2.5 (3) MG/3ML IN SOLN: 3.0000 mL | Freq: Four times a day (QID) | RESPIRATORY_TRACT | Status: DC

## 2015-03-08 MED ORDER — FLUCONAZOLE IN SODIUM CHLORIDE 200-0.9 MG/100ML-% IV SOLN
200.0000 mg | INTRAVENOUS | Status: DC
Start: 2015-03-08 — End: 2015-03-09
  Administered 2015-03-08: 200 mg via INTRAVENOUS
  Filled 2015-03-08 (×2): qty 100

## 2015-03-08 MED ORDER — ACETAMINOPHEN 325 MG PO TABS: 650.0000 mg | ORAL_TABLET | Freq: Four times a day (QID) | ORAL | Status: DC | PRN

## 2015-03-08 MED ORDER — ATORVASTATIN CALCIUM 40 MG PO TABS
40.0000 mg | ORAL_TABLET | Freq: Every day | ORAL | Status: DC
  Administered 2015-03-09 – 2015-03-14 (×7): 40 mg via ORAL
  Filled 2015-03-08 (×7): qty 1

## 2015-03-08 MED ORDER — SODIUM CHLORIDE 0.9 % IV SOLN
INTRAVENOUS | Status: DC
  Administered 2015-03-09 – 2015-03-13 (×3): via INTRAVENOUS

## 2015-03-08 MED ORDER — DEXTROSE 5 % IV SOLN
1.0000 g | INTRAVENOUS | Status: DC
  Administered 2015-03-09 – 2015-03-10 (×2): 1 g via INTRAVENOUS
  Filled 2015-03-08 (×3): qty 10

## 2015-03-08 MED ORDER — ONDANSETRON HCL 4 MG/2ML IJ SOLN: 4.0000 mg | Freq: Four times a day (QID) | INTRAMUSCULAR | Status: DC | PRN

## 2015-03-08 MED ORDER — TRAZODONE HCL 50 MG PO TABS
50.0000 mg | ORAL_TABLET | Freq: Every evening | ORAL | Status: DC | PRN
  Administered 2015-03-12 – 2015-03-13 (×2): 50 mg via ORAL
  Filled 2015-03-08 (×2): qty 1

## 2015-03-08 NOTE — H&P (Addendum)
[facility] Admission History and Physical       Brandy Mcguire LKG:[id number] DOB: [birth year] DOA: 03/08/2015  Referring physician: EDP PCP: [name], MD  Specialists:   Chief Complaint: Feels Bad  HPI: Brandy Mcguire is a 81 y.o. female with a history of Paroxsymal Atrial Fibrillation, CAD S/P PCI with Stents X 2, CHF, HTN, Stage III CKD, Hypothyroid, Dementia  and previous CVA who was sent to the ED from the [facility] in [place] due to worsening of her BUN/Cr on labs performed on 09/08  Through 09/10.   Her BUN/Cr was found at 17.1/3.58 on 09/08 and IVFs were given a the SNF but her BUN Cr continued to rise and on 09/0-8 was found at 20.8/4.46.  She was recently treated for a UTI on 09/02 when Urine Culture results returned positive for Proteus and she was placed on Cipro Rx.   In the ED, she was found to have a BUN/Cr = 23/4.88 (and her baseline Cr has been 1.4-1.6).    A UA was also positive and a urine Culture was sent.  She was placed on IV Rocephin empirically and referred for admission.     Patient reports not feeling well but she can not given any other history of her symptoms.     Patient also has a history of Neurogenic bladder and urinary retention with a foley catheter placed, and has recurrent UTIs.   She had a Positive VRE Urine Culture in 12/06/2014 Sensitive to Linezolid, Synercid and Tetracycline and was treated.   She has a history of RAS,S/P Left Renal Stent x 2.     Review of Systems: Unable to Obtain from the Patient  Past Medical History  Diagnosis Date  . Stroke   . CAD (coronary artery disease)   . Hypertension   . Bite from cat 03/17/2011  . CHF (congestive heart failure)   . PAF (paroxysmal atrial fibrillation)   . Hypothyroidism   . Dementia   . CKD (chronic kidney disease) stage 3, GFR 30-59 ml/min 09/28/2014  . Thoracoabdominal aortic aneurysm 09/29/2014    6.4 x 6.0 cm; previously 4.2 cm.  . Traumatic hematoma of buttock  09/28/2014  . A-fib      Past Surgical History  Procedure Laterality Date  . Stents       kidneys and 2 in heart  . Abdominal hysterectomy        Prior to Admission medications   Medication Sig Start Date End Date Taking? Authorizing Provider  Amino Acids-Protein Hydrolys (FEEDING SUPPLEMENT, PRO-STAT SUGAR FREE 64,) LIQD Take 30 mLs by mouth daily.   Yes Historical Provider, MD  atorvastatin (LIPITOR) 40 MG tablet Take 40 mg by mouth at bedtime.   Yes Historical Provider, MD  ciprofloxacin (CIPRO) 500 MG tablet Take 500 mg by mouth 2 (two) times daily.   Yes Historical Provider, MD  clopidogrel (PLAVIX) 75 MG tablet Take 1 tablet (75 mg total) by mouth at bedtime. Resume in 1 week if hemoglobin stable Patient taking differently: Take 75 mg by mouth at bedtime.  10/03/14  Yes Kathie Dike, MD  furosemide (LASIX) 20 MG tablet Take 10 mg by mouth every other day.   Yes Historical Provider, MD  ipratropium-albuterol (DUONEB) 0.5-2.5 (3) MG/3ML SOLN Take 3 mLs by nebulization every 6 (six) hours. 10/03/14  Yes Kathie Dike, MD  levothyroxine (SYNTHROID, LEVOTHROID) 125 MCG tablet Take 125 mcg by mouth daily before breakfast.   Yes Historical Provider, MD  LORazepam (  ATIVAN) 0.5 MG tablet Take 0.25 mg by mouth every 6 (six) hours as needed for anxiety.   Yes Historical Provider, MD  senna-docusate (SENOKOT-S) 8.6-50 MG per tablet Take 1 tablet by mouth daily.   Yes Historical Provider, MD  tamsulosin (FLOMAX) 0.4 MG CAPS capsule Take 1 capsule (0.4 mg total) by mouth daily after supper. 12/16/14  Yes Lezlie Octave Black, NP  traZODone (DESYREL) 50 MG tablet Take 50 mg by mouth at bedtime as needed.    Yes Historical Provider, MD  fluconazole (DIFLUCAN) 100 MG tablet Take 1 tablet (100 mg total) by mouth daily. Patient not taking: Reported on 01/22/2015 12/29/14   Erline Hau, MD  potassium chloride (K-DUR) 10 MEQ tablet Take 10 mEq by mouth as directed. Takes on Monday and Thursday     Historical Provider, MD     No Known Allergies  Social History:  reports that she quit smoking about 13 years ago. Her smoking use included Cigarettes. She has a 50 pack-year smoking history. She has never used smokeless tobacco. She reports that she does not drink alcohol or use illicit drugs.    Family History  Problem Relation Age of Onset  . Cancer Mother   . Cancer Sister   . COPD Sister   . Heart failure Sister        Physical Exam:  GEN:  Pleasant Elderly Well Nourished and Well Developed 81 y.o. Caucasian female examined and in no acute distress; cooperative with exam Filed Vitals:   03/08/15 2034 03/08/15 2100 03/08/15 2130 03/08/15 2200  BP: 133/94 149/112 152/100 143/104  Pulse: 82 92 96 89  Temp: 97.9 F (36.6 C)     TempSrc: Oral     Resp: 18 19 27 19   Height: 5\' 4"  (1.626 m)     Weight: 66.225 kg (146 lb)     SpO2: 100% 100% 96% 99%   Blood pressure 143/104, pulse 89, temperature 97.9 F (36.6 C), temperature source Oral, resp. rate 19, height 5\' 4"  (1.626 m), weight 66.225 kg (146 lb), SpO2 99 %. PSYCH: She is alert and oriented x 2; does not appear anxious does not appear depressed; affect is normal HEENT: Normocephalic and Atraumatic, Mucous membranes pink; PERRLA; EOM intact; Fundi:  Benign;  No scleral icterus, Nares: Patent, Oropharynx: Clear, Edentulous,    Neck:  FROM, No Cervical Lymphadenopathy nor Thyromegaly or Carotid Bruit; No JVD; Breasts:: Not examined CHEST WALL: No tenderness CHEST: Normal respiration, clear to auscultation bilaterally HEART: Regular rate and rhythm; no murmurs rubs or gallops BACK: No kyphosis or scoliosis; No CVA tenderness ABDOMEN: Positive Bowel Sounds, Soft Non-Tender, No Rebound or Guarding; No Masses, No Organomegaly. Rectal Exam: Not done EXTREMITIES: NoCyanosis, Clubbing, or Edema; No Ulcerations. Genitalia: not examined PULSES: 2+ and symmetric SKIN: + Ecchymosis of BLEs ( differing stages of healing)  CNS:   Alert and Oriented x 2, No Focal Deficits Vascular: pulses palpable throughout    Labs on Admission:  Basic Metabolic Panel:  Recent Labs Lab 03/08/15 2100  NA 135  K 4.2  CL 97*  CO2 31  GLUCOSE 91  BUN 23*  CREATININE 4.88*  CALCIUM 8.1*   Liver Function Tests: No results for input(s): AST, ALT, ALKPHOS, BILITOT, PROT, ALBUMIN in the last 168 hours. No results for input(s): LIPASE, AMYLASE in the last 168 hours. No results for input(s): AMMONIA in the last 168 hours. CBC:  Recent Labs Lab 03/08/15 2100  WBC 4.7  NEUTROABS 2.9  HGB  10.9*  HCT 33.2*  MCV 93.8  PLT 127*   Cardiac Enzymes:  Recent Labs Lab 03/08/15 2100  TROPONINI 0.03    BNP (last 3 results)  Recent Labs  12/10/14 0039  BNP 95.0    ProBNP (last 3 results) No results for input(s): PROBNP in the last 8760 hours.  CBG: No results for input(s): GLUCAP in the last 168 hours.  Radiological Exams on Admission: No results found.   EKG: Independently reviewed. Atrial  Fibrillation rate =83   Assessment/Plan:   81 y.o. female with  Principal Problem:   1.     Acute kidney injury   Gentle IVFs   Monitor BUN/Cr (baseline Cr = 1.4-1.6)   Hold Nephrotoxins   Active Problems:   2.   Recurrent UTI   Urine C+S sent   Empiric IV Rocephin   Empiric Fluconazole     3.   CAD (coronary artery disease)   Continue Plavix, and Atorvastatin Rx     4.   PAF (paroxysmal atrial fibrillation)   Not a Coumadin Candidate        5.   CKD (chronic kidney disease) stage 3, GFR 30-59 ml/min     6.   CHF (congestive heart failure)    Chronic    Strict I/Os   Lasix on Hold due to #1     7.   Thrombocytopenia- Saw Hematology 01/2015    Chronic Monitor Trend       8.   Anemia-  With Normocytic Indices, Saw Hematology 01/2015   Chronic Monitor Trend       9.   Hypothyroid-   Continue Levothyroxine Rx   10.   Dementia   Stable    11   DVT Prophylaxis   SCDs due to  Thrombocytopenia        Code Status:     FULL CODE     Family Communication:    No Family Present    Disposition Plan:    Inpatient  Status        Time spent:  [facility] Pager [phone]   If [place] Please Contact the Day Rounding Team MD for [facility]  If 7PM-7AM, Please Contact Night-Floor Coverage  [email or web address] Password [email or web address] 03/08/2015, 10:45 PM     ADDENDUM:   Patient was seen and examined on 03/08/2015

## 2015-03-08 NOTE — ED Notes (Signed)
Pt from [facility] with abnormal lab work and dehydration after they were unable to establish IV access.

## 2015-03-08 NOTE — ED Provider Notes (Signed)
CSN: [id number]     Arrival date & time 03/08/15  2032 History   First MD Initiated Contact with Patient 03/08/15 2037     Chief Complaint  Patient presents with  . Dehydration      HPI  Patient presents from [facility], [facility], with the reported history of "dehydration and abnormal labs". Patient has a history of recurrent urinary tract infections. Has a baseline creatinine apparently 1.2-1.6. Had not any drinking well. Headache creatinine obtained on the eighth which was 3.5. There unable to obtain IV access despite an EMS visit last night. They encouraged and attempted to push fluids orally. Unable to tolerate. No vomiting or nausea. No diarrhea. However her creatinine increased over for today and was transferred here.   Past Medical History  Diagnosis Date  . Stroke   . CAD (coronary artery disease)   . Hypertension   . Bite from cat 03/17/2011  . CHF (congestive heart failure)   . PAF (paroxysmal atrial fibrillation)   . Hypothyroidism   . Dementia   . CKD (chronic kidney disease) stage 3, GFR 30-59 ml/min 09/28/2014  . Thoracoabdominal aortic aneurysm 09/29/2014    6.4 x 6.0 cm; previously 4.2 cm.  . Traumatic hematoma of buttock 09/28/2014  . A-fib    Past Surgical History  Procedure Laterality Date  . Stents       kidneys and 2 in heart  . Abdominal hysterectomy     Family History  Problem Relation Age of Onset  . Cancer Mother   . Cancer Sister   . COPD Sister   . Heart failure Sister    Social History  Substance Use Topics  . Smoking status: Former Smoker -- 1.00 packs/day for 50 years    Types: Cigarettes    Quit date: 04/03/2001  . Smokeless tobacco: Never Used  . Alcohol Use: No   OB History    Gravida Para Term Preterm AB TAB SAB Ectopic Multiple Living   7 7 7       5      Review of Systems  Constitutional: Negative for fever, chills, diaphoresis, appetite change and fatigue.  HENT: Negative for mouth sores, sore throat and trouble swallowing.   Eyes:  Negative for visual disturbance.  Respiratory: Negative for cough, chest tightness, shortness of breath and wheezing.   Cardiovascular: Negative for chest pain.  Gastrointestinal: Negative for nausea, vomiting, abdominal pain, diarrhea and abdominal distention.  Endocrine: Negative for polydipsia, polyphagia and polyuria.  Genitourinary: Negative for dysuria, frequency and hematuria.  Musculoskeletal: Negative for gait problem.  Skin: Negative for color change, pallor and rash.  Neurological: Negative for dizziness, syncope, light-headedness and headaches.  Hematological: Does not bruise/bleed easily.      Allergies  Review of patient's allergies indicates no known allergies.  Home Medications   Prior to Admission medications   Medication Sig Start Date End Date Taking? Authorizing Provider  AMINO ACIDS-PROTEIN HYDROLYS PO Take 30 mLs by mouth daily.    Historical Provider, MD  atorvastatin (LIPITOR) 40 MG tablet Take 40 mg by mouth at bedtime.    Historical Provider, MD  clopidogrel (PLAVIX) 75 MG tablet Take 1 tablet (75 mg total) by mouth at bedtime. Resume in 1 week if hemoglobin stable 10/03/14   Kathie Dike, MD  diltiazem (CARDIZEM) 30 MG tablet Take 1 tablet (30 mg total) by mouth every 8 (eight) hours. 10/03/14   Kathie Dike, MD  ENSURE (ENSURE) Take 237 mLs by mouth 2 (two) times daily between meals.  Historical Provider, MD  fluconazole (DIFLUCAN) 100 MG tablet Take 1 tablet (100 mg total) by mouth daily. Patient not taking: Reported on 01/22/2015 12/29/14   Erline Hau, MD  Furosemide (LASIX PO) Take 10 mg by mouth daily.    Historical Provider, MD  ipratropium-albuterol (DUONEB) 0.5-2.5 (3) MG/3ML SOLN Take 3 mLs by nebulization every 6 (six) hours. 10/03/14   Kathie Dike, MD  potassium chloride (K-DUR) 10 MEQ tablet Take 10 mEq by mouth once a week. On Monday    Historical Provider, MD  senna-docusate (SENOKOT-S) 8.6-50 MG per tablet Take 1 tablet by mouth  daily.    Historical Provider, MD  tamsulosin (FLOMAX) 0.4 MG CAPS capsule Take 1 capsule (0.4 mg total) by mouth daily after supper. 12/16/14   Radene Gunning, NP  traZODone (DESYREL) 50 MG tablet Take 50 mg by mouth at bedtime.      Historical Provider, MD   BP 152/100 mmHg  Pulse 96  Temp(Src) 97.9 F (36.6 C) (Oral)  Resp 27  Ht 5\' 4"  (1.626 m)  Wt 146 lb (66.225 kg)  BMI 25.05 kg/m2  SpO2 96% Physical Exam  Constitutional: She appears well-developed and well-nourished. No distress.  HENT:  Head: Normocephalic.  Dry mucous membranes.  Eyes: Conjunctivae are normal. Pupils are equal, round, and reactive to light. No scleral icterus.  Neck: Normal range of motion. Neck supple. No thyromegaly present.  Cardiovascular: Normal rate and regular rhythm.  Exam reveals no gallop and no friction rub.   No murmur heard. Pulmonary/Chest: Effort normal and breath sounds normal. No respiratory distress. She has no wheezes. She has no rales.  Abdominal: Soft. Bowel sounds are normal. She exhibits no distension. There is no tenderness. There is no rebound.  Musculoskeletal: Normal range of motion.  Neurological: She is alert.  Oriented 2. Asks what time of day it is. Oriented to herself. No she is at the hospital.  Skin: Skin is warm and dry. No rash noted.  Psychiatric: She has a normal mood and affect. Her behavior is normal.    ED Course  Procedures (including critical care time) Labs Review Labs Reviewed  CBC WITH DIFFERENTIAL/PLATELET - Abnormal; Notable for the following:    RBC 3.54 (*)    Hemoglobin 10.9 (*)    HCT 33.2 (*)    RDW 17.7 (*)    Platelets 127 (*)    All other components within normal limits  BASIC METABOLIC PANEL  URINALYSIS, ROUTINE W REFLEX MICROSCOPIC (NOT AT [facility])  TROPONIN I  I-STAT CG4 LACTIC ACID, ED    Imaging Review No results found. I have personally reviewed and evaluated these images and lab results as part of my medical decision-making.   EKG  Interpretation None      MDM   Final diagnoses:  Acute kidney injury    Angiocath insertion Performed by: Lolita Patella  Consent: Verbal consent obtained. Risks and benefits: risks, benefits and alternatives were discussed Time out: Immediately prior to procedure a "time out" was called to verify the correct patient, procedure, equipment, support staff and site/side marked as required.  Preparation: Patient was prepped and draped in the usual sterile fashion.  Vein Location: right AC  +Ultrasound Guided  Gauge: 20  Normal blood return and flush without difficulty Patient tolerance: Patient tolerated the procedure well with no immediate complications.  Patient given IV fluid bolus and started on infusion. Repeating to confirm labs. Will require admission for IV fluid rehydration for her acute  kidney injury.     Tanna Furry, MD 03/08/15 2142

## 2015-03-09 LAB — TSH: TSH: 7.209 u[IU]/mL — AB (ref 0.350–4.500)

## 2015-03-09 LAB — CBC
HEMATOCRIT: 29.7 % — AB (ref 36.0–46.0)
Hemoglobin: 9.8 g/dL — ABNORMAL LOW (ref 12.0–15.0)
MCH: 31 pg (ref 26.0–34.0)
MCHC: 33 g/dL (ref 30.0–36.0)
MCV: 94 fL (ref 78.0–100.0)
PLATELETS: 108 10*3/uL — AB (ref 150–400)
RBC: 3.16 MIL/uL — ABNORMAL LOW (ref 3.87–5.11)
RDW: 17.6 % — AB (ref 11.5–15.5)
WBC: 4.4 10*3/uL (ref 4.0–10.5)

## 2015-03-09 LAB — BASIC METABOLIC PANEL
Anion gap: 6 (ref 5–15)
BUN: 21 mg/dL — AB (ref 6–20)
CALCIUM: 7.5 mg/dL — AB (ref 8.9–10.3)
CO2: 27 mmol/L (ref 22–32)
CREATININE: 4.24 mg/dL — AB (ref 0.44–1.00)
Chloride: 104 mmol/L (ref 101–111)
GFR calc Af Amer: 11 mL/min — ABNORMAL LOW (ref >60)
GFR, EST NON AFRICAN AMERICAN: 9 mL/min — AB (ref >60)
GLUCOSE: 83 mg/dL (ref 65–99)
POTASSIUM: 3.6 mmol/L (ref 3.5–5.1)
SODIUM: 137 mmol/L (ref 135–145)

## 2015-03-09 LAB — MRSA PCR SCREENING: MRSA by PCR: POSITIVE — AB

## 2015-03-09 MED ORDER — CHLORHEXIDINE GLUCONATE CLOTH 2 % EX PADS
6.0000 | MEDICATED_PAD | Freq: Every day | CUTANEOUS | Status: AC
  Administered 2015-03-09 – 2015-03-13 (×5): 6 via TOPICAL

## 2015-03-09 MED ORDER — CHLORHEXIDINE GLUCONATE 0.12 % MT SOLN
15.0000 mL | Freq: Two times a day (BID) | OROMUCOSAL | Status: DC
  Administered 2015-03-09 – 2015-03-15 (×13): 15 mL via OROMUCOSAL
  Filled 2015-03-09 (×11): qty 15

## 2015-03-09 MED ORDER — MUPIROCIN 2 % EX OINT
1.0000 "application " | TOPICAL_OINTMENT | Freq: Two times a day (BID) | CUTANEOUS | Status: AC
  Administered 2015-03-09 – 2015-03-13 (×10): 1 via NASAL
  Filled 2015-03-09 (×3): qty 22

## 2015-03-09 MED ORDER — IPRATROPIUM-ALBUTEROL 0.5-2.5 (3) MG/3ML IN SOLN
3.0000 mL | Freq: Three times a day (TID) | RESPIRATORY_TRACT | Status: DC
  Administered 2015-03-09 – 2015-03-14 (×14): 3 mL via RESPIRATORY_TRACT
  Filled 2015-03-09 (×15): qty 3

## 2015-03-09 MED ORDER — CETYLPYRIDINIUM CHLORIDE 0.05 % MT LIQD
7.0000 mL | Freq: Two times a day (BID) | OROMUCOSAL | Status: DC
  Administered 2015-03-09 – 2015-03-15 (×14): 7 mL via OROMUCOSAL

## 2015-03-09 MED ORDER — LEVOTHYROXINE SODIUM 100 MCG PO TABS
125.0000 ug | ORAL_TABLET | Freq: Every day | ORAL | Status: DC
  Filled 2015-03-09 (×2): qty 1

## 2015-03-09 NOTE — Progress Notes (Signed)
Patient pulled IV out.  Tip intact.  Site cleaned and dressed.  New IV inserted.

## 2015-03-09 NOTE — Progress Notes (Signed)
[facility] PROGRESS NOTE  Brandy Mcguire ZDG:[id number] DOB: [birth year] DOA: 03/08/2015 PCP: [name], MD  Assessment/Plan: Acute on CKD Stage III-IV -Baseline Cr 1.4-1.6. -Cr was 4.88 on admission, down to 4.24 today with IVF. -Continue to hold nephrotoxins. -Suspect due to dehydration and decreased intravascular volume.  Recurrent UTI -Agree with rocephin and fluconazole pending cx data.  CAD -Stable, no CP.  A Fib -Rate controlled.  Thrombocytopenia -Chronic, followed by oncology. -Monitor trend. -SCDs for DVT prophylaxis.  Hypothyroidism -Check TSH. -Continue home dose of synthroid 125 mcg.  Dementia -At baseline.  Chronic Diastolic CHF -diuretics on hold secondary to ARF. -Compensated.  Code Status: Full Code Family Communication: Patient only  Disposition Plan: Back to SNF once ready   Consultants:  None   Anti-infectives:  Rocephin  Fluconazole   Subjective: Has no complaints.  Objective: Filed Vitals:   03/09/15 0004 03/09/15 0141 03/09/15 0542 03/09/15 0803  BP: 150/92  144/89   Pulse: 75  84   Temp:   98.7 F (37.1 C)   TempSrc:   Oral   Resp:   20   Height:      Weight: 68.8 kg (151 lb 10.8 oz)     SpO2: 100% 98% 100% 100%    Intake/Output Summary (Last 24 hours) at 03/09/15 1530 Last data filed at 03/09/15 0800  Gross per 24 hour  Intake    120 ml  Output    600 ml  Net   -480 ml   Filed Weights   03/08/15 2034 03/08/15 2339 03/09/15 0004  Weight: 66.225 kg (146 lb) 68.8 kg (151 lb 10.8 oz) 68.8 kg (151 lb 10.8 oz)    Exam:   General:  Awake  Cardiovascular: RRR  Respiratory: CTA B  Abdomen: S/NT/ND/+BS  Extremities: no C/C/E   Neurologic:  Non-focal  Data Reviewed: Basic Metabolic Panel:  Recent Labs Lab 03/08/15 2100 03/09/15 0630  NA 135 137  K 4.2 3.6  CL 97* 104  CO2 31 27  GLUCOSE 91 83  BUN 23* 21*  CREATININE 4.88* 4.24*  CALCIUM 8.1* 7.5*   Liver Function  Tests: No results for input(s): AST, ALT, ALKPHOS, BILITOT, PROT, ALBUMIN in the last 168 hours. No results for input(s): LIPASE, AMYLASE in the last 168 hours. No results for input(s): AMMONIA in the last 168 hours. CBC:  Recent Labs Lab 03/08/15 2100 03/09/15 0630  WBC 4.7 4.4  NEUTROABS 2.9  --   HGB 10.9* 9.8*  HCT 33.2* 29.7*  MCV 93.8 94.0  PLT 127* 108*   Cardiac Enzymes:  Recent Labs Lab 03/08/15 2100  TROPONINI 0.03   BNP (last 3 results)  Recent Labs  12/10/14 0039  BNP 95.0    ProBNP (last 3 results) No results for input(s): PROBNP in the last 8760 hours.  CBG: No results for input(s): GLUCAP in the last 168 hours.  Recent Results (from the past 240 hour(s))  MRSA PCR Screening     Status: Abnormal   Collection Time: 03/09/15  1:46 AM  Result Value Ref Range Status   MRSA by PCR POSITIVE (A) NEGATIVE Final    Comment: RESULT CALLED TO, READ BACK BY AND VERIFIED WITH: STURDIVANT,D AT 0503 ON 03/09/15 BY ISLEY,B        The GeneXpert MRSA Assay (FDA approved for NASAL specimens only), is one component of a comprehensive MRSA colonization surveillance program. It is not intended to diagnose MRSA infection nor to guide or monitor treatment for  MRSA infections.      Studies: No results found.  Scheduled Meds: . antiseptic oral rinse  7 mL Mouth Rinse q12n4p  . atorvastatin  40 mg Oral QHS  . cefTRIAXone (ROCEPHIN)  IV  1 g Intravenous Q24H  . chlorhexidine  15 mL Mouth Rinse BID  . Chlorhexidine Gluconate Cloth  6 each Topical Q0600  . clopidogrel  75 mg Oral QHS  . feeding supplement (PRO-STAT SUGAR FREE 64)  30 mL Oral Daily  . fluconazole  200 mg Oral QHS  . ipratropium-albuterol  3 mL Nebulization TID  . mupirocin ointment  1 application Nasal BID  . senna-docusate  1 tablet Oral Daily  . tamsulosin  0.4 mg Oral QPC supper   Continuous Infusions: . sodium chloride 75 mL/hr at 03/09/15 0515    Principal Problem:   Acute kidney  injury Active Problems:   CAD (coronary artery disease)   Recurrent UTI   PAF (paroxysmal atrial fibrillation)   CKD (chronic kidney disease) stage 3, GFR 30-59 ml/min   Dementia   Thrombocytopenia   CHF (congestive heart failure)   Anemia   AKI (acute kidney injury)    Time spent: 25 minutes. Greater than 50% of this time was spent in direct contact with the patient coordinating care.    [name]  [facility] Pager [phone]  If 7PM-7AM, please contact night-coverage at [email or web address], password [email or web address] 03/09/2015, 3:30 PM  LOS: 1 day

## 2015-03-09 NOTE — Progress Notes (Signed)
MRSA positive - required contact precautions obtained.

## 2015-03-09 NOTE — Progress Notes (Signed)
Patient pulled IV out.  Catheter intake.  Site cleaned and dressed.  New IV inserted.

## 2015-03-09 NOTE — Progress Notes (Signed)
Hand mittens applied to patient due to patient being confused and continuing to pull out her IVs.  Patient verbalizes understanding.

## 2015-03-10 LAB — BASIC METABOLIC PANEL
ANION GAP: 6 (ref 5–15)
BUN: 24 mg/dL — AB (ref 6–20)
CALCIUM: 7.2 mg/dL — AB (ref 8.9–10.3)
CO2: 26 mmol/L (ref 22–32)
CREATININE: 3.58 mg/dL — AB (ref 0.44–1.00)
Chloride: 104 mmol/L (ref 101–111)
GFR calc Af Amer: 13 mL/min — ABNORMAL LOW (ref >60)
GFR calc non Af Amer: 11 mL/min — ABNORMAL LOW (ref >60)
GLUCOSE: 71 mg/dL (ref 65–99)
Potassium: 3.5 mmol/L (ref 3.5–5.1)
Sodium: 136 mmol/L (ref 135–145)

## 2015-03-10 LAB — CBC
HCT: 30.3 % — ABNORMAL LOW (ref 36.0–46.0)
HEMOGLOBIN: 9.5 g/dL — AB (ref 12.0–15.0)
MCH: 30.3 pg (ref 26.0–34.0)
MCHC: 31.4 g/dL (ref 30.0–36.0)
MCV: 96.5 fL (ref 78.0–100.0)
Platelets: 121 10*3/uL — ABNORMAL LOW (ref 150–400)
RBC: 3.14 MIL/uL — ABNORMAL LOW (ref 3.87–5.11)
RDW: 17.8 % — AB (ref 11.5–15.5)
WBC: 4 10*3/uL (ref 4.0–10.5)

## 2015-03-10 MED ORDER — LEVOTHYROXINE SODIUM 75 MCG PO TABS
150.0000 ug | ORAL_TABLET | Freq: Every day | ORAL | Status: DC
  Administered 2015-03-10 – 2015-03-15 (×6): 150 ug via ORAL
  Filled 2015-03-10 (×4): qty 2
  Filled 2015-03-10: qty 6

## 2015-03-10 NOTE — Progress Notes (Signed)
Late entry for this am:  Notified Dr Jerilee Hoh of the patient not having IV access.  Voiced to the MD that the patient had pulled out over 4 IV sites, even with the  Mittens in place.  MD stated due to her DX she will have to have acess to help her creatinine to get better.  She suggest a sitter if the patient continues to pull out her IV access.  I verbalized understanding.  I notified the charge nurse of the MD suggestions and she stated that we would continue to monitor, and assess the need for the sitter.  Lattie Haw charge nurse went in and placed a new IV in place.  22g LFA.  Fluids restarted.

## 2015-03-10 NOTE — Progress Notes (Signed)
[facility] PROGRESS NOTE  Brandy Mcguire BZJ:[id number] DOB: 11-21-1933 DOA: 03/08/2015 PCP: [name], MD  Assessment/Plan: Acute on CKD Stage III-IV -Baseline Cr 1.4-1.6. -Cr was 4.88 on admission, down to 3.25 today with IVF. -Continue to hold nephrotoxins. -Suspect due to dehydration and decreased intravascular volume.  Recurrent UTI -Agree with rocephin and fluconazole pending cx data.  CAD -Stable, no CP.  A Fib -Rate controlled.  Thrombocytopenia -Chronic, followed by oncology. -Monitor trend. -SCDs for DVT prophylaxis.  Hypothyroidism -TSH elevated at 7.209. -Increase synthroid to 150 mcg. -Will need repeat thyroid functions tests in 4-6 weeks.  Dementia -With delirium, picking and lines and pulling out IVs. -Since IVF are paramount to her treatment, will request bedside sitter as patient is already on her fifth IV line.  Chronic Diastolic CHF -diuretics on hold secondary to ARF. -Compensated.  Code Status: Full Code Family Communication: Patient only  Disposition Plan: Back to SNF once ready   Consultants:  None   Anti-infectives:  Rocephin  Fluconazole   Subjective: Has been agitated thruout the night and pulling at IV lines.  Objective: Filed Vitals:   03/09/15 1500 03/09/15 2051 03/09/15 2148 03/10/15 0534  BP: 138/91  148/107 127/96  Pulse: 73  100 94  Temp: 98.5 F (36.9 C)  98.2 F (36.8 C) 98.1 F (36.7 C)  TempSrc: Oral  Oral Oral  Resp: 18  18 18   Height:      Weight:      SpO2: 100% 94% 96% 95%    Intake/Output Summary (Last 24 hours) at 03/10/15 1223 Last data filed at 03/10/15 0800  Gross per 24 hour  Intake    120 ml  Output   1550 ml  Net  -1430 ml   Filed Weights   03/08/15 2034 03/08/15 2339 03/09/15 0004  Weight: 66.225 kg (146 lb) 68.8 kg (151 lb 10.8 oz) 68.8 kg (151 lb 10.8 oz)    Exam:   General:  Awake  Cardiovascular: RRR  Respiratory: CTA B  Abdomen:  S/NT/ND/+BS  Extremities: no C/C/E   Neurologic:  Non-focal  Data Reviewed: Basic Metabolic Panel:  Recent Labs Lab 03/08/15 2100 03/09/15 0630 03/10/15 0616  NA 135 137 136  K 4.2 3.6 3.5  CL 97* 104 104  CO2 31 27 26   GLUCOSE 91 83 71  BUN 23* 21* 24*  CREATININE 4.88* 4.24* 3.58*  CALCIUM 8.1* 7.5* 7.2*   Liver Function Tests: No results for input(s): AST, ALT, ALKPHOS, BILITOT, PROT, ALBUMIN in the last 168 hours. No results for input(s): LIPASE, AMYLASE in the last 168 hours. No results for input(s): AMMONIA in the last 168 hours. CBC:  Recent Labs Lab 03/08/15 2100 03/09/15 0630 03/10/15 0616  WBC 4.7 4.4 4.0  NEUTROABS 2.9  --   --   HGB 10.9* 9.8* 9.5*  HCT 33.2* 29.7* 30.3*  MCV 93.8 94.0 96.5  PLT 127* 108* 121*   Cardiac Enzymes:  Recent Labs Lab 03/08/15 2100  TROPONINI 0.03   BNP (last 3 results)  Recent Labs  12/10/14 0039  BNP 95.0    ProBNP (last 3 results) No results for input(s): PROBNP in the last 8760 hours.  CBG: No results for input(s): GLUCAP in the last 168 hours.  Recent Results (from the past 240 hour(s))  MRSA PCR Screening     Status: Abnormal   Collection Time: 03/09/15  1:46 AM  Result Value Ref Range Status   MRSA by PCR POSITIVE (A) NEGATIVE  Final    Comment: RESULT CALLED TO, READ BACK BY AND VERIFIED WITH: STURDIVANT,D AT 0503 ON 03/09/15 BY ISLEY,B        The GeneXpert MRSA Assay (FDA approved for NASAL specimens only), is one component of a comprehensive MRSA colonization surveillance program. It is not intended to diagnose MRSA infection nor to guide or monitor treatment for MRSA infections.      Studies: No results found.  Scheduled Meds: . antiseptic oral rinse  7 mL Mouth Rinse q12n4p  . atorvastatin  40 mg Oral QHS  . cefTRIAXone (ROCEPHIN)  IV  1 g Intravenous Q24H  . chlorhexidine  15 mL Mouth Rinse BID  . Chlorhexidine Gluconate Cloth  6 each Topical Q0600  . clopidogrel  75 mg Oral  QHS  . feeding supplement (PRO-STAT SUGAR FREE 64)  30 mL Oral Daily  . fluconazole  200 mg Oral QHS  . ipratropium-albuterol  3 mL Nebulization TID  . levothyroxine  125 mcg Oral QAC breakfast  . mupirocin ointment  1 application Nasal BID  . senna-docusate  1 tablet Oral Daily  . tamsulosin  0.4 mg Oral QPC supper   Continuous Infusions: . sodium chloride 75 mL/hr at 03/09/15 0515    Principal Problem:   Acute kidney injury Active Problems:   CAD (coronary artery disease)   Recurrent UTI   PAF (paroxysmal atrial fibrillation)   CKD (chronic kidney disease) stage 3, GFR 30-59 ml/min   Dementia   Thrombocytopenia   CHF (congestive heart failure)   Anemia   AKI (acute kidney injury)    Time spent: 25 minutes. Greater than 50% of this time was spent in direct contact with the patient coordinating care.    [name]  [facility] Pager [phone]  If 7PM-7AM, please contact night-coverage at [email or web address], password [facility] 03/10/2015, 12:23 PM  LOS: 2 days

## 2015-03-10 NOTE — Clinical Social Work Note (Signed)
Clinical Social Work Assessment  Patient Details  Name: Brandy Mcguire MRN: [id number] Date of Birth: [birth year]  Date of referral:  03/10/15               Reason for consult:  Facility Placement                Permission sought to share information with:    Permission granted to share information::     Name::        Agency::     Relationship::     Contact Information:     Housing/Transportation Living arrangements for the past 2 months:  [place] of Information:  Facility Patient Interpreter Needed:  None Criminal Activity/Legal Involvement Pertinent to Current Situation/Hospitalization:  No - Comment as needed Significant Relationships:  Adult Children Lives with:  Facility Resident Do you feel safe going back to the place where you live?    Need for family participation in patient care:  Yes (Comment)  Care giving concerns:  Pt is resident at [facility].    Social Worker assessment / plan:  Pt oriented to self only due to dementia per facility. CSW attempted to reach both sons, but no voicemail set up. Voicemail left for pt's daughter, Arbie Cookey requesting return call. Pt admitted to hospital due to acute on chronic kidney disease and recurrent UTI. She has been a resident at [facility] for about 2 months. Family is involved and supportive per [facility]. CSW discussed pt with Debbie at facility. Debbie reports pt was skilled level of care but will return nursing. At baseline, pt uses wheelchair. Okay for return. Will update progress note when family returns call.   Employment status:  Retired Forensic scientist:    PT Recommendations:  Not assessed at this time Information / Referral to community resources:  Other (Comment Required) (return to [facility])  Patient/Family's Response to care:  Awaiting return call from family.   Patient/Family's Understanding of and Emotional Response to Diagnosis, Current Treatment, and Prognosis: Will assess when family present.    Emotional Assessment Appearance:  Appears stated age Attitude/Demeanor/Rapport:  Unable to Assess Affect (typically observed):  Unable to Assess Orientation:    Alcohol / Substance use:  Not Applicable Psych involvement (Current and /or in the community):  No (Comment)  Discharge Needs  Concerns to be addressed:  Discharge Planning Concerns Readmission within the last 30 days:  No Current discharge risk:  Chronically ill Barriers to Discharge:  Continued Medical Work up   [employer], [employer], [place] 03/10/2015, 12:27 PM [phone]

## 2015-03-10 NOTE — Progress Notes (Signed)
Patient pulled IV out.  Tip intact.  No signs or symptoms of distress.  Patient cleaned up and IV site dressed.

## 2015-03-10 NOTE — Progress Notes (Signed)
Patient is confused to the place and time so patient pulled IV out for the 5th time in 24 hours.Site cleaned and dressed.While pulling at the IV, patient created a small skin tear 2 by 3 centimeters in her left ac area.  Area cleaned and foam dressing applied to the site.  Patient shows no signs or symptoms of distress and patient tolerated well.  Verbalizes understanding the importance of keeping her IV inserted.

## 2015-03-11 LAB — BASIC METABOLIC PANEL
ANION GAP: 7 (ref 5–15)
BUN: 21 mg/dL — ABNORMAL HIGH (ref 6–20)
CHLORIDE: 106 mmol/L (ref 101–111)
CO2: 25 mmol/L (ref 22–32)
Calcium: 7.6 mg/dL — ABNORMAL LOW (ref 8.9–10.3)
Creatinine, Ser: 3.25 mg/dL — ABNORMAL HIGH (ref 0.44–1.00)
GFR calc non Af Amer: 13 mL/min — ABNORMAL LOW (ref >60)
GFR, EST AFRICAN AMERICAN: 15 mL/min — AB (ref >60)
Glucose, Bld: 79 mg/dL (ref 65–99)
POTASSIUM: 3.4 mmol/L — AB (ref 3.5–5.1)
Sodium: 138 mmol/L (ref 135–145)

## 2015-03-11 MED ORDER — CIPROFLOXACIN HCL 250 MG PO TABS
250.0000 mg | ORAL_TABLET | Freq: Two times a day (BID) | ORAL | Status: DC
  Administered 2015-03-11 – 2015-03-15 (×9): 250 mg via ORAL
  Filled 2015-03-11 (×9): qty 1

## 2015-03-11 NOTE — Care Management Note (Addendum)
Case Management Note  Patient Details  Name: KYUNG MUTO MRN: [id number] Date of Birth: 05-14-1934   Expected Discharge Date:   03/13/2015               Expected Discharge Plan:  [place]  In-House Referral:  Clinical Social Work  Discharge planning Services  CM Consult  Post Acute Care Choice:  NA Choice offered to:  NA  DME Arranged:    DME Agency:     HH Arranged:    [place] Agency:     Status of Service:  Completed, signed off  Medicare Important Message Given:    Date Medicare IM Given:    Medicare IM give by:    Date Additional Medicare IM Given:    Additional Medicare Important Message give by:     If discussed at [place] of Stay Meetings, dates discussed:    Additional Comments: Pt from [facility]. Anticipate DC back to SNF when ready. CSW is aware of DC plan and will arrange for return to facility. No CM needs noted.  Sherald Barge, RN 03/11/2015, 1:24 PM

## 2015-03-11 NOTE — Clinical Social Work Note (Signed)
Late entry: CSW spoke with pt's daughter, Arbie Cookey yesterday afternoon. Arbie Cookey reports things are going well at [facility] and she definitely requests for her to return there at d/c. She plans to follow up with RN regarding updates on condition.  Benay Pike, [place]

## 2015-03-11 NOTE — Progress Notes (Signed)
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 -Cr was 4.88 on admission, down to 3.25 today with IVF. -Continue to hold nephrotoxins. -Suspect due to dehydration and decreased intravascular volume.  Recurrent UTI -Change rocephin to cipro for 5 days. -Will DC fluconazole.  CAD -Stable, no CP.  A Fib -Rate controlled.  Thrombocytopenia -Chronic, followed by oncology. -Monitor trend. -SCDs for DVT prophylaxis.  Hypothyroidism -TSH elevated at 7.209. -Increase synthroid to 150 mcg. -Will need repeat thyroid functions tests in 4-6 weeks.  Dementia with Delirium -With delirium, picking and lines and pulling out IVs. -Sitter if needed to protect IV site.  Chronic Diastolic CHF -diuretics on hold secondary to ARF. -Compensated.  Code Status: Full Code Family Communication: Patient only  Disposition Plan: Back to SNF once ready; anticipate 24-48 hours.   Consultants:  None   Anti-infectives:  Rocephin  Subjective: Has been agitated thruout the night and pulling at IV lines.  Objective: Filed Vitals:   03/10/15 2132 03/11/15 0425 03/11/15 0729 03/11/15 0934  BP: 151/94 159/106  138/79  Pulse: 100 116  92  Temp: 98.2 F (36.8 C) 98.6 F (37 C)  97.6 F (36.4 C)  TempSrc: Oral Oral  Oral  Resp: 20 18  20   Height:      Weight:      SpO2: 100% 100% 93% 100%    Intake/Output Summary (Last 24 hours) at 03/11/15 1010 Last data filed at 03/11/15 0906  Gross per 24 hour  Intake    120 ml  Output   1775 ml  Net  -1655 ml   Filed Weights   03/08/15 2034 03/08/15 2339 03/09/15 0004  Weight: 66.225 kg (146 lb) 68.8 kg (151 lb 10.8 oz) 68.8 kg (151 lb 10.8 oz)    Exam:   General:  Awake  Cardiovascular: RRR  Respiratory: CTA B  Abdomen: S/NT/ND/+BS  Extremities: no C/C/E   Neurologic:   Non-focal  Data Reviewed: Basic Metabolic Panel:  Recent Labs Lab 03/08/15 2100 03/09/15 0630 03/10/15 0616 03/11/15 0639  NA 135 137 136 138  K 4.2 3.6 3.5 3.4*  CL 97* 104 104 106  CO2 31 27 26 25   GLUCOSE 91 83 71 79  BUN 23* 21* 24* 21*  CREATININE 4.88* 4.24* 3.58* 3.25*  CALCIUM 8.1* 7.5* 7.2* 7.6*   Liver Function Tests: No results for input(s): AST, ALT, ALKPHOS, BILITOT, PROT, ALBUMIN in the last 168 hours. No results for input(s): LIPASE, AMYLASE in the last 168 hours. No results for input(s): AMMONIA in the last 168 hours. CBC:  Recent Labs Lab 03/08/15 2100 03/09/15 0630 03/10/15 0616  WBC 4.7 4.4 4.0  NEUTROABS 2.9  --   --   HGB 10.9* 9.8* 9.5*  HCT 33.2* 29.7* 30.3*  MCV 93.8 94.0 96.5  PLT 127* 108* 121*   Cardiac Enzymes:  Recent Labs Lab 03/08/15 2100  TROPONINI 0.03   BNP (last 3 results)  Recent Labs  12/10/14 0039  BNP 95.0    ProBNP (last 3 results) No results for input(s): PROBNP in the last 8760 hours.  CBG: No results for input(s): GLUCAP in the last 168 hours.  Recent Results (from the past 240 hour(s))  MRSA PCR Screening     Status: Abnormal   Collection Time: 03/09/15  1:46 AM  Result Value Ref Range Status   MRSA by PCR POSITIVE (A)  NEGATIVE Final    Comment: RESULT CALLED TO, READ BACK BY AND VERIFIED WITH: STURDIVANT,D AT 0503 ON 03/09/15 BY ISLEY,B        The GeneXpert MRSA Assay (FDA approved for NASAL specimens only), is one component of a comprehensive MRSA colonization surveillance program. It is not intended to diagnose MRSA infection nor to guide or monitor treatment for MRSA infections.      Studies: No results found.  Scheduled Meds: . antiseptic oral rinse  7 mL Mouth Rinse q12n4p  . atorvastatin  40 mg Oral QHS  . cefTRIAXone (ROCEPHIN)  IV  1 g Intravenous Q24H  . chlorhexidine  15 mL Mouth Rinse BID  . Chlorhexidine Gluconate Cloth  6 each Topical Q0600  . clopidogrel  75 mg Oral QHS    . feeding supplement (PRO-STAT SUGAR FREE 64)  30 mL Oral Daily  . fluconazole  200 mg Oral QHS  . ipratropium-albuterol  3 mL Nebulization TID  . levothyroxine  150 mcg Oral QAC breakfast  . mupirocin ointment  1 application Nasal BID  . senna-docusate  1 tablet Oral Daily  . tamsulosin  0.4 mg Oral QPC supper   Continuous Infusions: . sodium chloride 75 mL/hr at 03/10/15 1442    Principal Problem:   Acute kidney injury Active Problems:   CAD (coronary artery disease)   Recurrent UTI   PAF (paroxysmal atrial fibrillation)   CKD (chronic kidney disease) stage 3, GFR 30-59 ml/min   Dementia   Thrombocytopenia   CHF (congestive heart failure)   Anemia   AKI (acute kidney injury)    Time spent: 25 minutes. Greater than 50% of this time was spent in direct contact with the patient coordinating care.    [name]  [facility] Pager [phone]  If 7PM-7AM, please contact night-coverage at [email or web address], password [facility] 03/11/2015, 10:10 AM  LOS: 3 days

## 2015-03-12 LAB — CBC
HCT: 28.2 % — ABNORMAL LOW (ref 36.0–46.0)
Hemoglobin: 9.4 g/dL — ABNORMAL LOW (ref 12.0–15.0)
MCH: 30.9 pg (ref 26.0–34.0)
MCHC: 33.3 g/dL (ref 30.0–36.0)
MCV: 92.8 fL (ref 78.0–100.0)
Platelets: 136 10*3/uL — ABNORMAL LOW (ref 150–400)
RBC: 3.04 MIL/uL — ABNORMAL LOW (ref 3.87–5.11)
RDW: 17.7 % — ABNORMAL HIGH (ref 11.5–15.5)
WBC: 3.8 10*3/uL — ABNORMAL LOW (ref 4.0–10.5)

## 2015-03-12 LAB — BASIC METABOLIC PANEL WITH GFR
Anion gap: 6 (ref 5–15)
BUN: 22 mg/dL — ABNORMAL HIGH (ref 6–20)
CO2: 26 mmol/L (ref 22–32)
Calcium: 7.5 mg/dL — ABNORMAL LOW (ref 8.9–10.3)
Chloride: 107 mmol/L (ref 101–111)
Creatinine, Ser: 2.9 mg/dL — ABNORMAL HIGH (ref 0.44–1.00)
GFR calc Af Amer: 17 mL/min — ABNORMAL LOW
GFR calc non Af Amer: 14 mL/min — ABNORMAL LOW
Glucose, Bld: 76 mg/dL (ref 65–99)
Potassium: 3.4 mmol/L — ABNORMAL LOW (ref 3.5–5.1)
Sodium: 139 mmol/L (ref 135–145)

## 2015-03-12 NOTE — Progress Notes (Signed)
[facility] PICC/IV nurse Anderson Malta notified regarding patient needing IV access. Patient has scattered bruising on bilateral arms with limited venous access. MD aware.

## 2015-03-12 NOTE — Progress Notes (Signed)
1220 IV catheter to RIGHT wrist/forearm noted bent and mostly pulled out by patient. Area bleeding and leaking, IV fluids stopped and IV catheter removed completely. Area cleansed and wrapped in gauze for protection. Foam drsg replaced to skin tear next to IV insertion site, also noted bleeding. MD notified.

## 2015-03-12 NOTE — Progress Notes (Signed)
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 -Cr was 4.88 on admission, down to 2.90 today with IVF. -Continue to hold nephrotoxins. -Suspect due to dehydration and decreased intravascular volume.  Recurrent UTI -Continue cipro for 5 days.  CAD -Stable, no CP.  A Fib -Rate controlled.  Thrombocytopenia -Chronic, followed by oncology. -Monitor trend; stable. -SCDs for DVT prophylaxis.  Hypothyroidism -TSH elevated at 7.209. -Increase synthroid to 150 mcg. -Will need repeat thyroid functions tests in 4-6 weeks.  Dementia with Delirium -With delirium, picking and lines and pulling out IVs. -Sitter if needed to protect IV site.  Chronic Diastolic CHF -diuretics on hold secondary to ARF. -Compensated.  Code Status: Full Code Family Communication: Patient only  Disposition Plan: Back to SNF once ready; anticipate 24-48 hours.   Consultants:  None   Anti-infectives:  Rocephin  Subjective: No new events. Continues to have mild delirium.  Objective: Filed Vitals:   03/11/15 2028 03/11/15 2300 03/12/15 0618 03/12/15 0752  BP:  138/90 147/95   Pulse:  94 85   Temp:  98.3 F (36.8 C) 97.7 F (36.5 C)   TempSrc:  Oral Oral   Resp:  20 15   Height:      Weight:      SpO2: 96% 96% 96% 96%    Intake/Output Summary (Last 24 hours) at 03/12/15 1047 Last data filed at 03/12/15 0850  Gross per 24 hour  Intake 2118.75 ml  Output   2250 ml  Net -131.25 ml   Filed Weights   03/08/15 2034 03/08/15 2339 03/09/15 0004  Weight: 66.225 kg (146 lb) 68.8 kg (151 lb 10.8 oz) 68.8 kg (151 lb 10.8 oz)    Exam:   General:  Awake  Cardiovascular: RRR  Respiratory: CTA B  Abdomen: S/NT/ND/+BS  Extremities: no C/C/E   Neurologic:  Non-focal  Data Reviewed: Basic Metabolic Panel:  Recent Labs Lab  03/08/15 2100 03/09/15 0630 03/10/15 0616 03/11/15 0639 03/12/15 0637  NA 135 137 136 138 139  K 4.2 3.6 3.5 3.4* 3.4*  CL 97* 104 104 106 107  CO2 31 27 26 25 26   GLUCOSE 91 83 71 79 76  BUN 23* 21* 24* 21* 22*  CREATININE 4.88* 4.24* 3.58* 3.25* 2.90*  CALCIUM 8.1* 7.5* 7.2* 7.6* 7.5*   Liver Function Tests: No results for input(s): AST, ALT, ALKPHOS, BILITOT, PROT, ALBUMIN in the last 168 hours. No results for input(s): LIPASE, AMYLASE in the last 168 hours. No results for input(s): AMMONIA in the last 168 hours. CBC:  Recent Labs Lab 03/08/15 2100 03/09/15 0630 03/10/15 0616 03/12/15 0637  WBC 4.7 4.4 4.0 3.8*  NEUTROABS 2.9  --   --   --   HGB 10.9* 9.8* 9.5* 9.4*  HCT 33.2* 29.7* 30.3* 28.2*  MCV 93.8 94.0 96.5 92.8  PLT 127* 108* 121* 136*   Cardiac Enzymes:  Recent Labs Lab 03/08/15 2100  TROPONINI 0.03   BNP (last 3 results)  Recent Labs  12/10/14 0039  BNP 95.0    ProBNP (last 3 results) No results for input(s): PROBNP in the last 8760 hours.  CBG: No results for input(s): GLUCAP in the last 168 hours.  Recent Results (from the past 240 hour(s))  MRSA PCR Screening     Status: Abnormal   Collection Time: 03/09/15  1:46 AM  Result Value Ref Range Status  MRSA by PCR POSITIVE (A) NEGATIVE Final    Comment: RESULT CALLED TO, READ BACK BY AND VERIFIED WITH: STURDIVANT,D AT 0503 ON 03/09/15 BY ISLEY,B        The GeneXpert MRSA Assay (FDA approved for NASAL specimens only), is one component of a comprehensive MRSA colonization surveillance program. It is not intended to diagnose MRSA infection nor to guide or monitor treatment for MRSA infections.      Studies: No results found.  Scheduled Meds: . antiseptic oral rinse  7 mL Mouth Rinse q12n4p  . atorvastatin  40 mg Oral QHS  . chlorhexidine  15 mL Mouth Rinse BID  . Chlorhexidine Gluconate Cloth  6 each Topical Q0600  . ciprofloxacin  250 mg Oral BID  . clopidogrel  75 mg Oral  QHS  . feeding supplement (PRO-STAT SUGAR FREE 64)  30 mL Oral Daily  . ipratropium-albuterol  3 mL Nebulization TID  . levothyroxine  150 mcg Oral QAC breakfast  . mupirocin ointment  1 application Nasal BID  . senna-docusate  1 tablet Oral Daily  . tamsulosin  0.4 mg Oral QPC supper   Continuous Infusions: . sodium chloride 75 mL/hr at 03/11/15 1219    Principal Problem:   Acute kidney injury Active Problems:   CAD (coronary artery disease)   Recurrent UTI   PAF (paroxysmal atrial fibrillation)   CKD (chronic kidney disease) stage 3, GFR 30-59 ml/min   Dementia   Thrombocytopenia   CHF (congestive heart failure)   Anemia   AKI (acute kidney injury)    Time spent: 25 minutes. Greater than 50% of this time was spent in direct contact with the patient coordinating care.    Brandy Mcguire  [facility] Pager [phone]  If 7PM-7AM, please contact night-coverage at [email or web address], password [facility] 03/12/2015, 10:47 AM  LOS: 4 days

## 2015-03-13 LAB — BASIC METABOLIC PANEL
ANION GAP: 6 (ref 5–15)
BUN: 22 mg/dL — ABNORMAL HIGH (ref 6–20)
CALCIUM: 7.1 mg/dL — AB (ref 8.9–10.3)
CO2: 25 mmol/L (ref 22–32)
Chloride: 106 mmol/L (ref 101–111)
Creatinine, Ser: 2.41 mg/dL — ABNORMAL HIGH (ref 0.44–1.00)
GFR calc non Af Amer: 18 mL/min — ABNORMAL LOW (ref >60)
GFR, EST AFRICAN AMERICAN: 21 mL/min — AB (ref >60)
Glucose, Bld: 88 mg/dL (ref 65–99)
Potassium: 2.9 mmol/L — ABNORMAL LOW (ref 3.5–5.1)
Sodium: 137 mmol/L (ref 135–145)

## 2015-03-13 NOTE — Progress Notes (Signed)
[facility] PROGRESS NOTE  HONESTY MENTA RUE:[id number] DOB: 13-Jun-1934 DOA: 03/08/2015 PCP: [name], MD  Assessment/Plan: Acute on CKD Stage III-IV -Baseline Cr 1.4-1.6. -Cr was 4.88 on admission, down to 2.41 today with IVF. -Continue to hold nephrotoxins. -Suspect due to dehydration and decreased intravascular volume.  Recurrent UTI -Continue cipro for 5 days.  CAD -Stable, no CP.  A Fib -Rate controlled.  Thrombocytopenia -Chronic, followed by oncology. -Monitor trend; stable. -SCDs for DVT prophylaxis.  Hypothyroidism -TSH elevated at 7.209. -Increased synthroid to 150 mcg. -Will need repeat thyroid functions tests in 4-6 weeks.  Dementia with Delirium -With delirium, picking and lines and pulling out IVs. -Sitter if needed to protect IV site.  Chronic Diastolic CHF -diuretics on hold secondary to ARF. -Compensated.  Code Status: Full Code Family Communication: Patient only  Disposition Plan: Back to SNF once ready; anticipate 24-48 hours.   Consultants:  None   Anti-infectives:  Rocephin  Subjective: No new events. Continues to have mild delirium.  Objective: Filed Vitals:   03/13/15 0758 03/13/15 1105 03/13/15 1449 03/13/15 1511  BP:  145/104  150/99  Pulse:  94  108  Temp:  97.8 F (36.6 C)  97.9 F (36.6 C)  TempSrc:  Oral  Oral  Resp:  16  16  Height:      Weight:      SpO2: 90% 98% 94% 97%    Intake/Output Summary (Last 24 hours) at 03/13/15 1524 Last data filed at 03/13/15 0615  Gross per 24 hour  Intake      0 ml  Output   1250 ml  Net  -1250 ml   Filed Weights   03/08/15 2034 03/08/15 2339 03/09/15 0004  Weight: 66.225 kg (146 lb) 68.8 kg (151 lb 10.8 oz) 68.8 kg (151 lb 10.8 oz)    Exam:   General:  Awake  Cardiovascular: RRR  Respiratory: CTA B  Abdomen: S/NT/ND/+BS  Extremities: no C/C/E   Neurologic:  Non-focal  Data Reviewed: Basic Metabolic Panel:  Recent Labs Lab  03/09/15 0630 03/10/15 0616 03/11/15 0639 03/12/15 0637 03/13/15 0856  NA 137 136 138 139 137  K 3.6 3.5 3.4* 3.4* 2.9*  CL 104 104 106 107 106  CO2 27 26 25 26 25   GLUCOSE 83 71 79 76 88  BUN 21* 24* 21* 22* 22*  CREATININE 4.24* 3.58* 3.25* 2.90* 2.41*  CALCIUM 7.5* 7.2* 7.6* 7.5* 7.1*   Liver Function Tests: No results for input(s): AST, ALT, ALKPHOS, BILITOT, PROT, ALBUMIN in the last 168 hours. No results for input(s): LIPASE, AMYLASE in the last 168 hours. No results for input(s): AMMONIA in the last 168 hours. CBC:  Recent Labs Lab 03/08/15 2100 03/09/15 0630 03/10/15 0616 03/12/15 0637  WBC 4.7 4.4 4.0 3.8*  NEUTROABS 2.9  --   --   --   HGB 10.9* 9.8* 9.5* 9.4*  HCT 33.2* 29.7* 30.3* 28.2*  MCV 93.8 94.0 96.5 92.8  PLT 127* 108* 121* 136*   Cardiac Enzymes:  Recent Labs Lab 03/08/15 2100  TROPONINI 0.03   BNP (last 3 results)  Recent Labs  12/10/14 0039  BNP 95.0    ProBNP (last 3 results) No results for input(s): PROBNP in the last 8760 hours.  CBG: No results for input(s): GLUCAP in the last 168 hours.  Recent Results (from the past 240 hour(s))  MRSA PCR Screening     Status: Abnormal   Collection Time: 03/09/15  1:46 AM  Result Value Ref Range Status   MRSA by PCR POSITIVE (A) NEGATIVE Final    Comment: RESULT CALLED TO, READ BACK BY AND VERIFIED WITH: STURDIVANT,D AT 0503 ON 03/09/15 BY ISLEY,B        The GeneXpert MRSA Assay (FDA approved for NASAL specimens only), is one component of a comprehensive MRSA colonization surveillance program. It is not intended to diagnose MRSA infection nor to guide or monitor treatment for MRSA infections.      Studies: No results found.  Scheduled Meds: . antiseptic oral rinse  7 mL Mouth Rinse q12n4p  . atorvastatin  40 mg Oral QHS  . chlorhexidine  15 mL Mouth Rinse BID  . ciprofloxacin  250 mg Oral BID  . clopidogrel  75 mg Oral QHS  . feeding supplement (PRO-STAT SUGAR FREE 64)  30  mL Oral Daily  . ipratropium-albuterol  3 mL Nebulization TID  . levothyroxine  150 mcg Oral QAC breakfast  . mupirocin ointment  1 application Nasal BID  . senna-docusate  1 tablet Oral Daily  . tamsulosin  0.4 mg Oral QPC supper   Continuous Infusions: . sodium chloride 75 mL/hr at 03/12/15 1445    Principal Problem:   Acute kidney injury Active Problems:   CAD (coronary artery disease)   Recurrent UTI   PAF (paroxysmal atrial fibrillation)   CKD (chronic kidney disease) stage 3, GFR 30-59 ml/min   Dementia   Thrombocytopenia   CHF (congestive heart failure)   Anemia   AKI (acute kidney injury)    Time spent: 15 minutes. Greater than 50% of this time was spent in direct contact with the patient coordinating care.    [name]  [facility] Pager [phone]  If 7PM-7AM, please contact night-coverage at [email or web address], password [facility] 03/13/2015, 3:24 PM  LOS: 5 days

## 2015-03-13 NOTE — Clinical Social Work Note (Signed)
CSW updated [facility] on pt. Facility remains agreeable to return at d/c. Will continue to follow.   Benay Pike, [place]

## 2015-03-14 ENCOUNTER — Inpatient Hospital Stay (HOSPITAL_COMMUNITY): Payer: Medicare Other

## 2015-03-14 DIAGNOSIS — I480 Paroxysmal atrial fibrillation: Secondary | ICD-10-CM

## 2015-03-14 DIAGNOSIS — I10 Essential (primary) hypertension: Secondary | ICD-10-CM | POA: Diagnosis present

## 2015-03-14 DIAGNOSIS — I5032 Chronic diastolic (congestive) heart failure: Secondary | ICD-10-CM

## 2015-03-14 DIAGNOSIS — N390 Urinary tract infection, site not specified: Secondary | ICD-10-CM

## 2015-03-14 DIAGNOSIS — E876 Hypokalemia: Secondary | ICD-10-CM | POA: Diagnosis not present

## 2015-03-14 DIAGNOSIS — N179 Acute kidney failure, unspecified: Principal | ICD-10-CM

## 2015-03-14 LAB — BASIC METABOLIC PANEL
Anion gap: 7 (ref 5–15)
BUN: 20 mg/dL (ref 6–20)
CHLORIDE: 108 mmol/L (ref 101–111)
CO2: 24 mmol/L (ref 22–32)
CREATININE: 2.09 mg/dL — AB (ref 0.44–1.00)
Calcium: 7 mg/dL — ABNORMAL LOW (ref 8.9–10.3)
GFR calc Af Amer: 25 mL/min — ABNORMAL LOW (ref >60)
GFR calc non Af Amer: 21 mL/min — ABNORMAL LOW (ref >60)
Glucose, Bld: 74 mg/dL (ref 65–99)
POTASSIUM: 2.9 mmol/L — AB (ref 3.5–5.1)
Sodium: 139 mmol/L (ref 135–145)

## 2015-03-14 LAB — MAGNESIUM: Magnesium: 1.2 mg/dL — ABNORMAL LOW (ref 1.7–2.4)

## 2015-03-14 MED ORDER — DILTIAZEM HCL 30 MG PO TABS
30.0000 mg | ORAL_TABLET | Freq: Four times a day (QID) | ORAL | Status: DC
  Administered 2015-03-14 – 2015-03-15 (×6): 30 mg via ORAL
  Filled 2015-03-14 (×5): qty 1

## 2015-03-14 MED ORDER — DILTIAZEM HCL 25 MG/5ML IV SOLN
10.0000 mg | INTRAVENOUS | Status: AC
  Administered 2015-03-14: 10 mg via INTRAVENOUS
  Filled 2015-03-14: qty 5

## 2015-03-14 MED ORDER — POTASSIUM CHLORIDE IN NACL 20-0.9 MEQ/L-% IV SOLN
INTRAVENOUS | Status: DC
  Administered 2015-03-14 – 2015-03-15 (×2): via INTRAVENOUS

## 2015-03-14 MED ORDER — POTASSIUM CHLORIDE CRYS ER 20 MEQ PO TBCR
20.0000 meq | EXTENDED_RELEASE_TABLET | Freq: Three times a day (TID) | ORAL | Status: DC
  Administered 2015-03-14 – 2015-03-15 (×5): 20 meq via ORAL
  Filled 2015-03-14 (×5): qty 1

## 2015-03-14 MED ORDER — MAGNESIUM SULFATE 50 % IJ SOLN
1.0000 g | Freq: Once | INTRAVENOUS | Status: DC
  Filled 2015-03-14: qty 2

## 2015-03-14 MED ORDER — IPRATROPIUM-ALBUTEROL 0.5-2.5 (3) MG/3ML IN SOLN: 3.0000 mL | RESPIRATORY_TRACT | Status: DC | PRN

## 2015-03-14 MED ORDER — MAGNESIUM SULFATE IN D5W 10-5 MG/ML-% IV SOLN
1.0000 g | Freq: Once | INTRAVENOUS | Status: AC
  Administered 2015-03-14: 1 g via INTRAVENOUS
  Filled 2015-03-14: qty 100

## 2015-03-14 NOTE — Consult Note (Signed)
   [facility] CM Inpatient Consult   03/14/2015  QUENNA DOEPKE [birth year] [id number]   Patient evaluated for [facility] services based on number of admissions. Noted patient is a resident of [facility] and it appears discharge plan is return there. Will not engage for services at this time.   Marthenia Rolling, MSN-Ed, RN,BSN [facility] Liaison [phone]

## 2015-03-14 NOTE — Care Management Important Message (Signed)
Important Message  Patient Details  Name: Brandy Mcguire MRN: [id number] Date of Birth: 11-Jun-1934   Medicare Important Message Given:  Yes-second notification given    [name], RN 03/14/2015, 2:02 PM

## 2015-03-14 NOTE — Progress Notes (Signed)
[facility] PROGRESS NOTE  Brandy Mcguire ZMO:[id number] DOB: 1933/09/05 DOA: 03/08/2015 PCP: [name], MD    Code Status: Full code Family Communication: Family not available Disposition Plan: Discharge to SNF when clinically appropriate.   Consultants:  None  Procedures:  None  Antibiotics:  Rocephin>>> Cipro  HPI/Subjective: Patient is confused, but alert and says that she is not in pain. She denies shortness of breath or chest pain.  Objective: Filed Vitals:   03/14/15 1053  BP: 155/101  Pulse: 112  Temp:   Resp: 16   TEMPERATURE 97.6. OXYGEN SATURATION 93% ON ROOM AIR.  Intake/Output Summary (Last 24 hours) at 03/14/15 1131 Last data filed at 03/14/15 1055  Gross per 24 hour  Intake    360 ml  Output   1850 ml  Net  -1490 ml   Filed Weights   03/08/15 2034 03/08/15 2339 03/09/15 0004  Weight: 66.225 kg (146 lb) 68.8 kg (151 lb 10.8 oz) 68.8 kg (151 lb 10.8 oz)    Exam:   General:  Pleasantly confused 81 year old woman in no acute distress.  Cardiovascular: Irregular, irregular, with tachycardia.  Respiratory: Few bibasilar crackles; breathing slightly labored although the patient denies this.  Abdomen: Positive bowel sounds, soft, nontender, nondistended.  Musculoskeletal/extremities: Trace of pedal edema. No acute hot red joints.  Neurologic: She is alert and oriented to herself, but not to time place or year. She follows no commands. Will   Data Reviewed: Basic Metabolic Panel:  Recent Labs Lab 03/09/15 0630 03/10/15 0616 03/11/15 0639 03/12/15 0637 03/13/15 0856  NA 137 136 138 139 137  K 3.6 3.5 3.4* 3.4* 2.9*  CL 104 104 106 107 106  CO2 27 26 25 26 25   GLUCOSE 83 71 79 76 88  BUN 21* 24* 21* 22* 22*  CREATININE 4.24* 3.58* 3.25* 2.90* 2.41*  CALCIUM 7.5* 7.2* 7.6* 7.5* 7.1*   Liver Function Tests: No results for input(s): AST, ALT, ALKPHOS, BILITOT, PROT, ALBUMIN in the last 168 hours. No results for  input(s): LIPASE, AMYLASE in the last 168 hours. No results for input(s): AMMONIA in the last 168 hours. CBC:  Recent Labs Lab 03/08/15 2100 03/09/15 0630 03/10/15 0616 03/12/15 0637  WBC 4.7 4.4 4.0 3.8*  NEUTROABS 2.9  --   --   --   HGB 10.9* 9.8* 9.5* 9.4*  HCT 33.2* 29.7* 30.3* 28.2*  MCV 93.8 94.0 96.5 92.8  PLT 127* 108* 121* 136*   Cardiac Enzymes:  Recent Labs Lab 03/08/15 2100  TROPONINI 0.03   BNP (last 3 results)  Recent Labs  12/10/14 0039  BNP 95.0    ProBNP (last 3 results) No results for input(s): PROBNP in the last 8760 hours.  CBG: No results for input(s): GLUCAP in the last 168 hours.  Recent Results (from the past 240 hour(s))  MRSA PCR Screening     Status: Abnormal   Collection Time: 03/09/15  1:46 AM  Result Value Ref Range Status   MRSA by PCR POSITIVE (A) NEGATIVE Final    Comment: RESULT CALLED TO, READ BACK BY AND VERIFIED WITH: STURDIVANT,D AT 0503 ON 03/09/15 BY ISLEY,B        The GeneXpert MRSA Assay (FDA approved for NASAL specimens only), is one component of a comprehensive MRSA colonization surveillance program. It is not intended to diagnose MRSA infection nor to guide or monitor treatment for MRSA infections.      Studies: No results found.  Scheduled Meds: . antiseptic oral rinse  7 mL Mouth Rinse q12n4p  . atorvastatin  40 mg Oral QHS  . chlorhexidine  15 mL Mouth Rinse BID  . ciprofloxacin  250 mg Oral BID  . clopidogrel  75 mg Oral QHS  . feeding supplement (PRO-STAT SUGAR FREE 64)  30 mL Oral Daily  . levothyroxine  150 mcg Oral QAC breakfast  . potassium chloride  20 mEq Oral TID  . senna-docusate  1 tablet Oral Daily  . tamsulosin  0.4 mg Oral QPC supper   Continuous Infusions: . sodium chloride 75 mL/hr at 03/13/15 1743      Assessment and plan:   Principal Problem:   Acute kidney injury Active Problems:   Recurrent UTI   PAF (paroxysmal atrial fibrillation)   CKD (chronic kidney disease)  stage 3, GFR 30-59 ml/min   CAD (coronary artery disease)   Essential hypertension   Dementia   Thrombocytopenia   Anemia    1. Acute kidney injury in a patient with stage III to stage IV chronic kidney disease. -The patient's creatinine was 4.88 on admission. Her baseline creatinine per chart review is 1.4-1.8, but she has had creatinines in the past in the 2.5-2.8 range. -IV fluids were started on admission. Her creatinine has improved progressively.  -Current creatinine pending. -I am concerned that she may have developed some pulmonary edema given her chronic diastolic dysfunction. So, will decrease her IV fluids and order a stat chest x-ray. I/O's recorded as negative, but this is puzzling given she is on IV fluids.  Chronic diastolic dysfunction. Patient is treated chronically with Lasix. It has been held since admission due to AKI. In the setting of a few pulmonary crackles heard on lung exam and daily IV fluids, will order a chest x-ray for further evaluation. We'll restart Lasix if there is evidence of pulmonary edema.  -Patient has not had a 2-D echocardiogram since 2011. Will order another one.   Paroxysmal atrial fibrillation, with RVR on admission and telemetry. Patient had been treated with diltiazem in the past, but her admission medications do not indicate that it was continued. She is also treated with Plavix chronically; it was continued. -We'll give her 10 mg of IV Cardizem now and restart 3 times a day daily dosing.  Hypertension. As above, the patient had been treated with diltiazem in the past. Her blood pressure has trended up. We'll restart diltiazem.  Hypokalemia. Patient's serum potassium was 4.2 on admission. With IV fluids, it has fallen to 2.9. Potassium chloride supplementation was started and potassium was added to the IV fluids this morning. -We will continue repletion. Will order a magnesium level to rule out deficiency.  UTI. Patient's urinalysis  on admission was consistent with infection. Urine culture was ordered, but no evidence of results yet. She was started on Rocephin and then transition to oral Cipro. Currently stable.  Thrombocytopenia. Apparently, the patient has chronic thrombocytopenia and is followed by oncology. Her platelet count was 127 on admission. It fell to a nadir of 108, but has improved since then.  Hypothyroidism. Patient's TSH was elevated at 7.2. Synthroid was increased to 150 g per Dr. Jerilee Hoh. -Recommend follow-up TSH and free T4 in 4-6 weeks.  Chronic dementia. Patient had some delirium with picking at lines and pulling out IVs, but she appears to be stable currently.     Time spent: 35 minutes    [facility] Pager [phone]. If 7PM-7AM, please contact night-coverage at [email or web address], password [facility] 03/14/2015, 11:31 AM  LOS: 6  days

## 2015-03-14 NOTE — Care Management Note (Signed)
Case Management Note  Patient Details  Name: ZORANA BROCKWELL MRN: [id number] Date of Birth: [birth year]  Subjective/Objective:                    Action/Plan:   Expected Discharge Date:                  Expected Discharge Plan:  Skilled Nursing Facility  In-House Referral:  Clinical Social Work  Discharge planning Services  CM Consult  Post Acute Care Choice:  NA Choice offered to:  NA  DME Arranged:    DME Agency:     HH Arranged:    [place] Agency:     Status of Service:  Completed, signed off  Medicare Important Message Given:  Yes-second notification given Date Medicare IM Given:    Medicare IM give by:    Date Additional Medicare IM Given:    Additional Medicare Important Message give by:     If discussed at [place] of Stay Meetings, dates discussed:    Additional Comments: Anticipate discharge over the weekend, No CM needs noted. Christinia Gully [place], RN 03/14/2015, 2:24 PM

## 2015-03-15 ENCOUNTER — Encounter (HOSPITAL_COMMUNITY): Payer: Self-pay | Admitting: Internal Medicine

## 2015-03-15 ENCOUNTER — Inpatient Hospital Stay (HOSPITAL_COMMUNITY): Payer: Medicare Other

## 2015-03-15 DIAGNOSIS — I5032 Chronic diastolic (congestive) heart failure: Secondary | ICD-10-CM | POA: Diagnosis present

## 2015-03-15 DIAGNOSIS — I272 Pulmonary hypertension, unspecified: Secondary | ICD-10-CM

## 2015-03-15 DIAGNOSIS — I509 Heart failure, unspecified: Secondary | ICD-10-CM

## 2015-03-15 DIAGNOSIS — D696 Thrombocytopenia, unspecified: Secondary | ICD-10-CM

## 2015-03-15 DIAGNOSIS — N183 Chronic kidney disease, stage 3 (moderate): Secondary | ICD-10-CM

## 2015-03-15 HISTORY — DX: Chronic diastolic (congestive) heart failure: I50.32

## 2015-03-15 HISTORY — DX: Pulmonary hypertension, unspecified: I27.20

## 2015-03-15 LAB — CBC
HEMATOCRIT: 27.1 % — AB (ref 36.0–46.0)
HEMOGLOBIN: 8.9 g/dL — AB (ref 12.0–15.0)
MCH: 30.9 pg (ref 26.0–34.0)
MCHC: 32.8 g/dL (ref 30.0–36.0)
MCV: 94.1 fL (ref 78.0–100.0)
Platelets: 140 10*3/uL — ABNORMAL LOW (ref 150–400)
RBC: 2.88 MIL/uL — AB (ref 3.87–5.11)
RDW: 17.7 % — ABNORMAL HIGH (ref 11.5–15.5)
WBC: 3.2 10*3/uL — ABNORMAL LOW (ref 4.0–10.5)

## 2015-03-15 LAB — BASIC METABOLIC PANEL
ANION GAP: 4 — AB (ref 5–15)
BUN: 17 mg/dL (ref 6–20)
CHLORIDE: 111 mmol/L (ref 101–111)
CO2: 24 mmol/L (ref 22–32)
Calcium: 7.2 mg/dL — ABNORMAL LOW (ref 8.9–10.3)
Creatinine, Ser: 1.94 mg/dL — ABNORMAL HIGH (ref 0.44–1.00)
GFR calc non Af Amer: 23 mL/min — ABNORMAL LOW (ref >60)
GFR, EST AFRICAN AMERICAN: 27 mL/min — AB (ref >60)
Glucose, Bld: 75 mg/dL (ref 65–99)
Potassium: 3.9 mmol/L (ref 3.5–5.1)
Sodium: 139 mmol/L (ref 135–145)

## 2015-03-15 MED ORDER — POTASSIUM CHLORIDE CRYS ER 20 MEQ PO TBCR: 20.0000 meq | EXTENDED_RELEASE_TABLET | Freq: Every day | ORAL | Status: DC

## 2015-03-15 MED ORDER — LEVOTHYROXINE SODIUM 150 MCG PO TABS: 150.0000 ug | ORAL_TABLET | Freq: Every day | ORAL | Status: AC

## 2015-03-15 MED ORDER — DILTIAZEM HCL 30 MG PO TABS: 30.0000 mg | ORAL_TABLET | Freq: Three times a day (TID) | ORAL | Status: AC

## 2015-03-15 MED ORDER — MAGNESIUM OXIDE -MG SUPPLEMENT 400 (240 MG) MG PO TABS: 1.0000 | ORAL_TABLET | Freq: Every day | ORAL | Status: AC

## 2015-03-15 MED ORDER — CIPROFLOXACIN HCL 500 MG PO TABS
500.0000 mg | ORAL_TABLET | Freq: Two times a day (BID) | ORAL | Status: DC
Start: 2015-03-15 — End: 2015-03-15

## 2015-03-15 MED ORDER — CIPROFLOXACIN HCL 250 MG PO TABS: 250.0000 mg | ORAL_TABLET | Freq: Two times a day (BID) | ORAL | Status: DC

## 2015-03-15 NOTE — Discharge Summary (Signed)
Physician Discharge Summary  Brandy Mcguire EQA:[id number] DOB: [birth year] DOA: 03/08/2015  PCP: [name], MD  Admit date: 03/08/2015 Discharge date: 03/15/2015  Time spent: Greater than 30  minutes  Recommendations for Outpatient Follow-up:  1. Recommend offering patient water or other liquids between meals to avoid dehydration.  2. Recommend follow-up of patient's thyroid function tests in 4 weeks; dose of Synthroid was increased. 3. Recommend follow-up of patient's renal function test in 3-5 days. Recommend follow-up of her serum potassium and magnesium level in 3-5 days.  Discharge Diagnoses:  1. Recurrent urinary tract infections;  possibly associated with indwelling Foley catheter. 2. Neurogenic bladder with urinary retention necessitating chronic Foley catheter. 3. Paroxysmal atrial fibrillation with mild RVR. 4. Chronic diastolic heart failure; Pulmonary hypertension per 2-D echocardiogram (43 mmHg). Ejection fraction 60-65%. 5. Coronary artery disease. 6. Stage III chronic kidney disease with acute kidney injury, secondary to prerenal azotemia. 7. Hypertension. 8. Chronic thrombocytopenia. 9. Chronic anemia. 10. Hypokalemia. 11. Hypomagnesemia. 12. Chronic dementia. 13. History of thoracoabdominal aortic aneurysm measuring 6.4 x 6.0 cm in April 2016.    Discharge Condition: Improved.  Diet recommendation: Dysphagia 2 with thin liquids. Encourage between meal beverages to avoid dehydration.  Filed Weights   03/08/15 2034 03/08/15 2339 03/09/15 0004  Weight: 66.225 kg (146 lb) 68.8 kg (151 lb 10.8 oz) 68.8 kg (151 lb 10.8 oz)    History of present illness:  The patient is a 81 year old woman with a history of dementia, paroxysmal atrial fibrillation, CAD, chronic kidney disease, hypothyroidism, and neurogenic bladder with chronic indwelling Foley catheter. She presented to the ED from [facility] skilled nursing facility due to worsening outpatient labs which  revealed acute renal failure superimposed on stage III chronic kidney disease. Apparently, she was started on IV fluids at the skilled nursing facility. She was also recently treated for Proteus urinary tract infection with Cipro. During the evaluation in the ED, she was found to have a BUN/creatinine of 23/4.88 (baseline creatinine was 1.4-1.8). Her urinalysis was positive for bacteria and leukocytes. She was admitted for further evaluation and management.  Hospital Course:   1. Acute kidney injury in a patient with stage III to stage IV chronic kidney disease. -The patient's creatinine was 4.88 on admission. Her baseline creatinine per chart review is 1.4-1.8, but she has had creatinines in the past in the 2.5-2.8 range. -IV fluids were started on admission. Lasix was discontinued. Her creatinine  improved progressively.  -At the time of discharge, her creatinine was 1.94. Would recommend holding Lasix further. Would recommend encouraging oral intake of fluids as tolerated.  Chronic diastolic dysfunction. Patient is treated chronically with Lasix. It was discontinued secondary to recurrent AK from dehydration. She did have a few pulmonary crackles/wheezes on exam, but this is thought to be secondary to chronic COPD and not having her bronchodilators reordered. Nevertheless, a chest x-ray was ordered and there was no evidence of pulmonary edema. Her 2-D echocardiogram revealed pulmonary hypertension and high left ventricular filling pressures consistent with diastolic dysfunction. Her EF was 60-65%. Would recommend holding Lasix following discharge given her propensity for recurrent dehydration. Her diastolic dysfunction remained compensated.  Paroxysmal atrial fibrillation, with RVR on admission and telemetry. Patient had been treated with diltiazem in the past, but her admission medications did not included. She is also treated with Plavix chronically; it was continued. Patient developed mild  rapid ventricular rate. She was given 10 mg of IV Cardizem 1 and then restarted on oral diltiazem. Her rate  improved. Would recommend continuing diltiazem.  Hypertension. As above, the patient had been treated with diltiazem in the past. Her blood pressure had trended up. Diltiazem was started with improvement in her blood pressure.  Hypokalemia. Patient's serum potassium was 4.2 on admission. With IV fluids, it had fallen to 2.9. Potassium chloride supplementation was started and potassium was added to the IV fluids. Her magnesium level result became available prior to discharge and was low at 1.2. She will be discharged on magnesium oxide and her home dose of potassium will be increased to 20 mEq daily.  UTI, associated with indwelling Foley catheter. Patient's urinalysis on admission was consistent with infection. Urine culture was ordered, but there were no results reported. It was decided that a follow-up urine culture would not yield adequate results given that it would be sterile. She was started on Rocephin and then transition to oral Cipro. Recommend 2 more days of Cipro.  Thrombocytopenia. Apparently, the patient has chronic thrombocytopenia and is followed by oncology. Her platelet count was 127 on admission. It fell to a nadir of 108, but it improved to 140 prior to discharge.  Hypothyroidism. Patient's TSH was elevated at 7.2. Synthroid was increased to 150 g per Dr. Jerilee Hoh. -Recommend follow-up TSH and free T4 in 4-6 weeks.  Chronic dementia. Patient remained stable with exception of mild confusion on occasion.     Procedures:  2-D echocardiogram 03/15/15:Study Conclusions - Left ventricle: The cavity size was normal. There was moderate concentric hypertrophy. Systolic function was normal. The estimated ejection fraction was in the range of 60% to 65%. Wall motion was normal; there were no regional wall motion abnormalities. Doppler parameters are  consistent with high ventricular filling pressure. - Aortic valve: There was mild regurgitation. - Mitral valve: Calcified annulus. There was mild regurgitation. - Right atrium: The atrium was mildly dilated. - Pulmonic valve: Poorly visualized. - Pulmonary arteries: PA peak pressure: 43 mm Hg (S). Impressions: - The right ventricular systolic pressure was increased consistent with moderate pulmonary hypertension.  Consultations:  None  Discharge Exam: Filed Vitals:   03/15/15 1441  BP: 109/73  Pulse: 62  Temp: 97.9 F (36.6 C)  Resp: 18    General: Pleasantly confused 81 year old woman in no acute distress.  Cardiovascular: Irregular, irregular.  Respiratory: Coarse breath sounds, but no audible wheezes or crackles. Breathing nonlabored..  Abdomen: Positive bowel sounds, soft, nontender, nondistended.  Musculoskeletal/extremities: No pedal edema. No acute hot red joints.  Neurologic: She is alert and oriented to herself, but not to time place or year. She follows no commands.   Discharge Instructions   Discharge Instructions    Diet - low sodium heart healthy    Complete by:  As directed      Increase activity slowly    Complete by:  As directed           Current Discharge Medication List    START taking these medications   Details  diltiazem (CARDIZEM) 30 MG tablet Take 1 tablet (30 mg total) by mouth 3 (three) times daily.    Magnesium Oxide 400 (240 MG) MG TABS Take 1 tablet by mouth daily.    potassium chloride SA (K-DUR,KLOR-CON) 20 MEQ tablet Take 1 tablet (20 mEq total) by mouth daily.      CONTINUE these medications which have CHANGED   Details  ciprofloxacin (CIPRO) 250 MG tablet Take 1 tablet (250 mg total) by mouth 2 (two) times daily. Continue until 03/17/15.    levothyroxine (SYNTHROID, LEVOTHROID)  150 MCG tablet Take 1 tablet (150 mcg total) by mouth daily before breakfast.      CONTINUE these medications which have NOT CHANGED    Details  Amino Acids-Protein Hydrolys (FEEDING SUPPLEMENT, PRO-STAT SUGAR FREE 64,) LIQD Take 30 mLs by mouth daily.    atorvastatin (LIPITOR) 40 MG tablet Take 40 mg by mouth at bedtime.    clopidogrel (PLAVIX) 75 MG tablet Take 1 tablet (75 mg total) by mouth at bedtime. Resume in 1 week if hemoglobin stable    ipratropium-albuterol (DUONEB) 0.5-2.5 (3) MG/3ML SOLN Take 3 mLs by nebulization every 6 (six) hours. Qty: 360 mL    LORazepam (ATIVAN) 0.5 MG tablet Take 0.25 mg by mouth every 6 (six) hours as needed for anxiety.    senna-docusate (SENOKOT-S) 8.6-50 MG per tablet Take 1 tablet by mouth daily.    tamsulosin (FLOMAX) 0.4 MG CAPS capsule Take 1 capsule (0.4 mg total) by mouth daily after supper. Qty: 30 capsule, Refills: 1    traZODone (DESYREL) 50 MG tablet Take 50 mg by mouth at bedtime as needed.       STOP taking these medications     furosemide (LASIX) 20 MG tablet      fluconazole (DIFLUCAN) 100 MG tablet      potassium chloride (K-DUR) 10 MEQ tablet        No Known Allergies    The results of significant diagnostics from this hospitalization (including imaging, microbiology, ancillary and laboratory) are listed below for reference.    Significant Diagnostic Studies: Dg Chest Port 1 View  03/14/2015   CLINICAL DATA:  Shortness of breath disoriented  EXAM: PORTABLE CHEST - 1 VIEW  COMPARISON:  12/24/2014  FINDINGS: Aortic tortuosity and calcifications stable. Vascular pattern otherwise normal. Left lung is clear. New 1 cm pulmonary nodule right upper lobe.  IMPRESSION: Recommend CT thorax for right upper lobe 1 cm pulmonary nodule. Otherwise no acute findings.   Electronically Signed   By: Skipper Cliche M.D.   On: 03/14/2015 12:23    Microbiology: Recent Results (from the past 240 hour(s))  MRSA PCR Screening     Status: Abnormal   Collection Time: 03/09/15  1:46 AM  Result Value Ref Range Status   MRSA by PCR POSITIVE (A) NEGATIVE Final    Comment:  RESULT CALLED TO, READ BACK BY AND VERIFIED WITH: STURDIVANT,D AT 0503 ON 03/09/15 BY ISLEY,B        The GeneXpert MRSA Assay (FDA approved for NASAL specimens only), is one component of a comprehensive MRSA colonization surveillance program. It is not intended to diagnose MRSA infection nor to guide or monitor treatment for MRSA infections.      Labs: Basic Metabolic Panel:  Recent Labs Lab 03/11/15 0639 03/12/15 0637 03/13/15 0856 03/14/15 1101 03/15/15 1342  NA 138 139 137 139 139  K 3.4* 3.4* 2.9* 2.9* 3.9  CL 106 107 106 108 111  CO2 25 26 25 24 24   GLUCOSE 79 76 88 74 75  BUN 21* 22* 22* 20 17  CREATININE 3.25* 2.90* 2.41* 2.09* 1.94*  CALCIUM 7.6* 7.5* 7.1* 7.0* 7.2*  MG  --   --   --  1.2*  --    Liver Function Tests: No results for input(s): AST, ALT, ALKPHOS, BILITOT, PROT, ALBUMIN in the last 168 hours. No results for input(s): LIPASE, AMYLASE in the last 168 hours. No results for input(s): AMMONIA in the last 168 hours. CBC:  Recent Labs Lab 03/08/15 2100 03/09/15 0630  03/10/15 0616 03/12/15 0637 03/15/15 1342  WBC 4.7 4.4 4.0 3.8* 3.2*  NEUTROABS 2.9  --   --   --   --   HGB 10.9* 9.8* 9.5* 9.4* 8.9*  HCT 33.2* 29.7* 30.3* 28.2* 27.1*  MCV 93.8 94.0 96.5 92.8 94.1  PLT 127* 108* 121* 136* 140*   Cardiac Enzymes:  Recent Labs Lab 03/08/15 2100  TROPONINI 0.03   BNP: BNP (last 3 results)  Recent Labs  12/10/14 0039  BNP 95.0    ProBNP (last 3 results) No results for input(s): PROBNP in the last 8760 hours.  CBG: No results for input(s): GLUCAP in the last 168 hours.     Signed:  Joyce Leckey  [facility] 03/15/2015, 3:17 PM

## 2015-03-15 NOTE — Progress Notes (Signed)
Packet ready, EMS to room, moved to stretcher, left via stretcher via EMS for transport to [facility].

## 2015-03-15 NOTE — Plan of Care (Signed)
Problem: Phase I Progression Outcomes Goal: Voiding-avoid urinary catheter unless indicated Outcome: Not Met (add Reason) Chronic Foley use

## 2015-03-15 NOTE — Progress Notes (Signed)
Daughter, Junie Bame, called and notified that her mother is returning to [place] today.  Daughter appreciative of phone call.  Report called to [facility].  Medical necessity completed.  EMS called for transport by unit secretary.  Patient unable to sign discharge instructions due to dementia.

## 2015-04-16 ENCOUNTER — Other Ambulatory Visit (HOSPITAL_COMMUNITY): Payer: Self-pay

## 2015-04-16 DIAGNOSIS — D696 Thrombocytopenia, unspecified: Secondary | ICD-10-CM

## 2015-04-21 ENCOUNTER — Encounter (HOSPITAL_COMMUNITY): Payer: Self-pay | Admitting: Hematology & Oncology

## 2015-04-21 ENCOUNTER — Encounter (HOSPITAL_COMMUNITY): Payer: Medicare Other

## 2015-04-21 ENCOUNTER — Encounter (HOSPITAL_COMMUNITY): Payer: Medicare Other | Attending: Hematology & Oncology | Admitting: Hematology & Oncology

## 2015-04-21 VITALS — BP 74/54 | HR 97 | Resp 16 | Wt 145.8 lb

## 2015-04-21 DIAGNOSIS — D649 Anemia, unspecified: Secondary | ICD-10-CM

## 2015-04-21 DIAGNOSIS — I4891 Unspecified atrial fibrillation: Secondary | ICD-10-CM

## 2015-04-21 DIAGNOSIS — Z87891 Personal history of nicotine dependence: Secondary | ICD-10-CM

## 2015-04-21 DIAGNOSIS — N183 Chronic kidney disease, stage 3 unspecified: Secondary | ICD-10-CM

## 2015-04-21 DIAGNOSIS — D696 Thrombocytopenia, unspecified: Secondary | ICD-10-CM

## 2015-04-21 DIAGNOSIS — D62 Acute posthemorrhagic anemia: Secondary | ICD-10-CM

## 2015-04-21 DIAGNOSIS — Z809 Family history of malignant neoplasm, unspecified: Secondary | ICD-10-CM | POA: Diagnosis not present

## 2015-04-21 DIAGNOSIS — F0390 Unspecified dementia without behavioral disturbance: Secondary | ICD-10-CM

## 2015-04-21 LAB — CBC WITH DIFFERENTIAL/PLATELET
Basophils Absolute: 0 10*3/uL (ref 0.0–0.1)
Basophils Relative: 0 %
EOS PCT: 1 %
Eosinophils Absolute: 0.1 10*3/uL (ref 0.0–0.7)
HCT: 36.6 % (ref 36.0–46.0)
HEMOGLOBIN: 11.4 g/dL — AB (ref 12.0–15.0)
LYMPHS ABS: 1.8 10*3/uL (ref 0.7–4.0)
LYMPHS PCT: 35 %
MCH: 29.8 pg (ref 26.0–34.0)
MCHC: 31.1 g/dL (ref 30.0–36.0)
MCV: 95.6 fL (ref 78.0–100.0)
Monocytes Absolute: 0.5 10*3/uL (ref 0.1–1.0)
Monocytes Relative: 9 %
NEUTROS PCT: 55 %
Neutro Abs: 2.8 10*3/uL (ref 1.7–7.7)
PLATELETS: 235 10*3/uL (ref 150–400)
RBC: 3.83 MIL/uL — AB (ref 3.87–5.11)
RDW: 15.6 % — ABNORMAL HIGH (ref 11.5–15.5)
WBC: 5.2 10*3/uL (ref 4.0–10.5)

## 2015-04-21 NOTE — Patient Instructions (Signed)
[place] at [facility] Discharge Instructions  RECOMMENDATIONS MADE BY THE CONSULTANT AND ANY TEST RESULTS WILL BE SENT TO YOUR REFERRING PHYSICIAN.  Exam completed by Dr Whitney Muse today Return in 2 weeks for lab work Return in 1 month to see the doctor Please call the clinic if you have any questions or concerns  Thank you for choosing [place] at [facility] to provide your oncology and hematology care.  To afford each patient quality time with our provider, please arrive at least 15 minutes before your scheduled appointment time.    You need to re-schedule your appointment should you arrive 10 or more minutes late.  We strive to give you quality time with our providers, and arriving late affects you and other patients whose appointments are after yours.  Also, if you no show three or more times for appointments you may be dismissed from the clinic at the providers discretion.     Again, thank you for choosing [facility].  Our hope is that these requests will decrease the amount of time that you wait before being seen by our physicians.       _____________________________________________________________  Should you have questions after your visit to [facility], please contact our office at [phone] between the hours of 8:30 a.m. and 4:30 p.m.  Voicemails left after 4:30 p.m. will not be returned until the following business day.  For prescription refill requests, have your pharmacy contact our office.   '

## 2015-04-21 NOTE — Progress Notes (Signed)
[facility] Hematology/Oncology Progress Note  Name: Brandy Mcguire      MRN: [id number]        REFERRING PHYSICIAN:  Dr. Maudie Mercury  REASON FOR CONSULT:  Thrombocytopenia   DIAGNOSIS:  Thrombocytopenia, anemia  HISTORY OF PRESENT ILLNESS:   Brandy Mcguire is a 81 year old white American woman with a past history significant for recurrent UTI, AAA, H/O sepsis, A-fib, hypothyroidism, failure to thrive, dementia, CKD stage 3, CAD, and anemia who is referred to [facility] for thrombocytopenia.  She is noted to have two episodes of thrombocytopenia, one in April and another in July when she was admitted to the hospital on both occassions with Sepsis.  She was given Lovenox and Heparin during her June/July hospitalization (on my review of her chart).  Outside of these two hospitalizations within [facility], platelet count has been WNL and stable.  I do not see episodes of significant thrombocytopenia. I have reviewed a dictation from Dr. Maudie Mercury, dated 01/15/2015 reported resolution of thrombocytopenia, leukopenia, and improved anemia with a Hgb of 11.3 g/dL. She has documented Stage III chronic kidney disease.  The patient was hospitalized in September secondary to acute kidney injury and diastolic dysfunction. We see her for thrombocytopenia. Note that she has never had a severe anemia but has developed anemia over the last month. Two months ago she had a normal CBC. She is on plavix 75 mg.  Brandy Mcguire is here today with her caregiver. She presents in a wheelchair and states she is not feeling too good. She has not been eating much. Her caregiver notes that she has had low blood pressure lately. The caregiver brought a list of Brandy Mcguire's medications. Her primary care physician is Dr. Arnoldo Morale.    PAST MEDICAL HISTORY:   Past Medical History  Diagnosis Date  . Stroke ([place])   . CAD (coronary artery disease)   . Hypertension   . Bite from cat 03/17/2011  . CHF (congestive heart failure) ([place])    . PAF (paroxysmal atrial fibrillation) ([place])   . Hypothyroidism   . Dementia   . CKD (chronic kidney disease) stage 3, GFR 30-59 ml/min 09/28/2014  . Thoracoabdominal aortic aneurysm ([place]) 09/29/2014    6.4 x 6.0 cm; previously 4.2 cm.  . Traumatic hematoma of buttock 09/28/2014  . A-fib ([place])   . Renal artery stenosis ([facility])     Status post left renal stent.  . Multiple bilateral plumonary nodules  06/07/2011  . Sepsis due to enterococcus ([place]) 12/10/2014  . Chronic diastolic CHF (congestive heart failure) ([place]) 03/15/2015    EF 60-65%  . Pulmonary hypertension ([place]) 03/15/2015    43 mm Hg    ALLERGIES: No Known Allergies    MEDICATIONS: I have reviewed the patient's current medications.    Current Outpatient Prescriptions on File Prior to Visit  Medication Sig Dispense Refill  . Amino Acids-Protein Hydrolys (FEEDING SUPPLEMENT, PRO-STAT SUGAR FREE 64,) LIQD Take 30 mLs by mouth daily.    Marland Kitchen atorvastatin (LIPITOR) 40 MG tablet Take 40 mg by mouth at bedtime.    . clopidogrel (PLAVIX) 75 MG tablet Take 1 tablet (75 mg total) by mouth at bedtime. Resume in 1 week if hemoglobin stable (Patient taking differently: Take 75 mg by mouth at bedtime. )    . diltiazem (CARDIZEM) 30 MG tablet Take 1 tablet (30 mg total) by mouth 3 (three) times daily.    Marland Kitchen ipratropium-albuterol (DUONEB) 0.5-2.5 (3) MG/3ML SOLN Take  3 mLs by nebulization every 6 (six) hours. 360 mL   . levothyroxine (SYNTHROID, LEVOTHROID) 150 MCG tablet Take 1 tablet (150 mcg total) by mouth daily before breakfast.    . LORazepam (ATIVAN) 0.5 MG tablet Take 0.25 mg by mouth every 6 (six) hours as needed for anxiety.    . Magnesium Oxide 400 (240 MG) MG TABS Take 1 tablet by mouth daily.    . potassium chloride SA (K-DUR,KLOR-CON) 20 MEQ tablet Take 1 tablet (20 mEq total) by mouth daily.    Marland Kitchen senna-docusate (SENOKOT-S) 8.6-50 MG per tablet Take 1 tablet by mouth daily.    . tamsulosin (FLOMAX) 0.4 MG CAPS capsule Take 1 capsule (0.4  mg total) by mouth daily after supper. 30 capsule 1  . traZODone (DESYREL) 50 MG tablet Take 50 mg by mouth at bedtime as needed.     . ciprofloxacin (CIPRO) 250 MG tablet Take 1 tablet (250 mg total) by mouth 2 (two) times daily. Continue until 03/17/15. (Patient not taking: Reported on 04/21/2015)     No current facility-administered medications on file prior to visit.     PAST SURGICAL HISTORY Past Surgical History  Procedure Laterality Date  . Stents       kidneys and 2 in heart  . Abdominal hysterectomy      FAMILY HISTORY: Family History  Problem Relation Age of Onset  . Cancer Mother   . Cancer Sister   . COPD Sister   . Heart failure Sister     SOCIAL HISTORY:  reports that she quit smoking about 14 years ago. Her smoking use included Cigarettes. She has a 50 pack-year smoking history. She has never used smokeless tobacco. She reports that she does not drink alcohol or use illicit drugs.  PERFORMANCE STATUS: The patient's performance status is 3 - Symptomatic, >50% confined to bed  PHYSICAL EXAM: Most Recent Vital Signs: Blood pressure 74/54, pulse 97, resp. rate 16, weight 145 lb 12.8 oz (66.134 kg), SpO2 97 %. General appearance: alert, cooperative, appears stated age, no distress and in wheelchair, accompanied by aid from nursing facility. Head: Normocephalic, without obvious abnormality, atraumatic Eyes: negative findings: lids and lashes normal, conjunctivae and sclerae normal and corneas clear Neck: no adenopathy and supple, symmetrical, trachea midline Lungs: clear to auscultation bilaterally Heart: irregularly irregular rate and rhythm, S1, S2 normal, no murmur, click, rub or gallop Abdomen: soft, non-tender; bowel sounds normal; no masses,  no organomegaly     Foley in place. Extremities: extremities normal, atraumatic, no cyanosis or edema, bilateral lower extremity varicose veins L>R. Skin: Skin color, texture, turgor normal. No rashes or lesions Lymph  nodes: Cervical, supraclavicular, and axillary nodes normal. Neurologic: Grossly normal  LABORATORY DATA:  I have reviewed the results below Results for CHINIQUA, KILCREASE (MRN [id number])   Ref. Range 03/15/2015 13:42 04/21/2015 11:10  Sodium Latest Ref Range: 135-145 mmol/L 139   Potassium Latest Ref Range: 3.5-5.1 mmol/L 3.9   Chloride Latest Ref Range: 101-111 mmol/L 111   CO2 Latest Ref Range: 22-32 mmol/L 24   BUN Latest Ref Range: 6-20 mg/dL 17   Creatinine Latest Ref Range: 0.44-1.00 mg/dL 1.94 (H)   Calcium Latest Ref Range: 8.9-10.3 mg/dL 7.2 (L)   EGFR (Non-African Amer.) Latest Ref Range: >60 mL/min 23 (L)   EGFR (African American) Latest Ref Range: >60 mL/min 27 (L)   Glucose Latest Ref Range: 65-99 mg/dL 75   Anion gap Latest Ref Range: 5-15  4 (L)   WBC Latest  Ref Range: 4.0-10.5 K/uL 3.2 (L) 5.2  RBC Latest Ref Range: 3.87-5.11 MIL/uL 2.88 (L) 3.83 (L)  Hemoglobin Latest Ref Range: 12.0-15.0 g/dL 8.9 (L) 11.4 (L)  HCT Latest Ref Range: 36.0-46.0 % 27.1 (L) 36.6  MCV Latest Ref Range: 78.0-100.0 fL 94.1 95.6  MCH Latest Ref Range: 26.0-34.0 pg 30.9 29.8  MCHC Latest Ref Range: 30.0-36.0 g/dL 32.8 31.1  RDW Latest Ref Range: 11.5-15.5 % 17.7 (H) 15.6 (H)  Platelets Latest Ref Range: 150-400 K/uL 140 (L) 235  Neutrophils Latest Units: %  55  Lymphocytes Latest Units: %  35  Monocytes Relative Latest Units: %  9  Eosinophil Latest Units: %  1  Basophil Latest Units: %  0  NEUT# Latest Ref Range: 1.7-7.7 K/uL  2.8  Lymphocyte # Latest Ref Range: 0.7-4.0 K/uL  1.8  Monocyte # Latest Ref Range: 0.1-1.0 K/uL  0.5  Eosinophils Absolute Latest Ref Range: 0.0-0.7 K/uL  0.1  Basophils Absolute Latest Ref Range: 0.0-0.1 K/uL  0.0     ASSESSMENT:  1. Thrombocytopenia, resolved  2. Normocytic, normochromic, anemia, improved  3. Stage III Chronic Renal Disease 4. Dementia 5. Recurrent UTIs  6. AAA 7. H/O sepsis with hospitalizations in April 2016 and June/July 2016, at  which time thrombocytopenia was documented. 8. A-fib 9. Hypothyroidism, 10. Failure to thrive 11. CAD   PLAN:  CBC was normal on repeat here. She has a mildly abnormal kappa lambda light chain ratio which is most likely secondary from her chronic kidney disease. The remainder of her laboratory studies were unremarkable except for an elevation of CRP. This is currently of uncertain significance. Iron studies, B12 and folic acid were all within normal limits. Currently I just recommended ongoing observation.   She has had a recent admission with a hgb of 8.9 documented during that stay. Hgb today is more c/w baseline.  I have recommended one additional check in 4 weeks to confirm stability  All questions were answered. The patient knows to call the clinic with any problems, questions or concerns. We can certainly see the patient much sooner if necessary.   This document serves as a record of services personally performed by Shannon Penland, MD. It was created on her behalf by Elizabeth Ashley, a trained medical scribe. The creation of this record is based on the scribe's personal observations and the provider's statements to them. This document has been checked and approved by the attending provider.  I have reviewed the above documentation for accuracy and completeness, and I agree with the above.  This note is electronically signed.  Shannon K. Penland, MD 

## 2015-04-25 ENCOUNTER — Ambulatory Visit (HOSPITAL_COMMUNITY)
Admission: RE | Admit: 2015-04-25 | Discharge: 2015-04-25 | Disposition: A | Discharge status: Home / Self Care | Payer: Medicare Other | Source: Ambulatory Visit | Attending: Internal Medicine | Admitting: Internal Medicine

## 2015-04-25 DIAGNOSIS — N390 Urinary tract infection, site not specified: Secondary | ICD-10-CM | POA: Diagnosis present

## 2015-04-25 MED ORDER — SODIUM CHLORIDE 0.9 % IJ SOLN: 10.0000 mL | Freq: Two times a day (BID) | INTRAMUSCULAR | Status: DC

## 2015-04-25 MED ORDER — SODIUM CHLORIDE 0.9 % IJ SOLN: 10.0000 mL | INTRAMUSCULAR | Status: DC | PRN

## 2015-04-25 NOTE — Discharge Instructions (Signed)
PICC Home Guide A peripherally inserted central catheter (PICC) is a long, thin, flexible tube that is inserted into a vein in the upper arm. It is a form of intravenous (IV) access. It is considered to be a "central" line because the tip of the PICC ends in a large vein in your chest. This large vein is called the superior vena cava (SVC). The PICC tip ends in the SVC because there is a lot of blood flow in the SVC. This allows medicines and IV fluids to be quickly distributed throughout the body. The PICC is inserted using a sterile technique by a specially trained nurse or physician. After the PICC is inserted, a chest X-ray exam is done to be sure it is in the correct place.  A PICC may be placed for different reasons, such as:  To give medicines and liquid nutrition that can only be given through a central line. Examples are:  Certain antibiotic treatments.  Chemotherapy.  Total parenteral nutrition (TPN).  To take frequent blood samples.  To give IV fluids and blood products.  If there is difficulty placing a peripheral intravenous (PIV) catheter. If taken care of properly, a PICC can remain in place for several months. A PICC can also allow a person to go home from the hospital early. Medicine and PICC care can be managed at home by a family member or home health care team. WHAT PROBLEMS CAN HAPPEN WHEN I HAVE A PICC? Problems with a PICC can occasionally occur. These may include the following:  A blood clot (thrombus) forming in or at the tip of the PICC. This can cause the PICC to become clogged. A clot-dissolving medicine called tissue plasminogen activator (tPA) can be given through the PICC to help break up the clot.  Inflammation of the vein (phlebitis) in which the PICC is placed. Signs of inflammation may include redness, pain at the insertion site, red streaks, or being able to feel a "cord" in the vein where the PICC is located.  Infection in the PICC or at the insertion  site. Signs of infection may include fever, chills, redness, swelling, or pus drainage from the PICC insertion site.  PICC movement (malposition). The PICC tip may move from its original position due to excessive physical activity, forceful coughing, sneezing, or vomiting.  A break or cut in the PICC. It is important to not use scissors near the PICC.  Nerve or tendon irritation or injury during PICC insertion. WHAT SHOULD I KEEP IN MIND ABOUT ACTIVITIES WHEN I HAVE A PICC?  You may bend your arm and move it freely. If your PICC is near or at the bend of your elbow, avoid activity with repeated motion at the elbow.  Rest at home for the remainder of the day following PICC line insertion.  Avoid lifting heavy objects as instructed by your health care provider.  Avoid using a crutch with the arm on the same side as your PICC. You may need to use a walker. WHAT SHOULD I KNOW ABOUT MY PICC DRESSING?  Keep your PICC bandage (dressing) clean and dry to prevent infection.  Ask your health care provider when you may shower. Ask your health care provider to teach you how to wrap the PICC when you do take a shower.  Change the PICC dressing as instructed by your health care provider.  Change your PICC dressing if it becomes loose or wet. WHAT SHOULD I KNOW ABOUT PICC CARE?  Check the PICC insertion site   daily for leakage, redness, swelling, or pain.  Do not take a bath, swim, or use hot tubs when you have a PICC. Cover PICC line with clear plastic wrap and tape to keep it dry while showering.  Flush the PICC as directed by your health care provider. Let your health care provider know right away if the PICC is difficult to flush or does not flush. Do not use force to flush the PICC.  Do not use a syringe that is less than 10 mL to flush the PICC.  Never pull or tug on the PICC.  Avoid blood pressure checks on the arm with the PICC.  Keep your PICC identification card with you at all  times.  Do not take the PICC out yourself. Only a trained clinical professional should remove the PICC. SEEK IMMEDIATE MEDICAL CARE IF:  Your PICC is accidentally pulled all the way out. If this happens, cover the insertion site with a bandage or gauze dressing. Do not throw the PICC away. Your health care provider will need to inspect it.  Your PICC was tugged or pulled and has partially come out. Do not  push the PICC back in.  There is any type of drainage, redness, or swelling where the PICC enters the skin.  You cannot flush the PICC, it is difficult to flush, or the PICC leaks around the insertion site when it is flushed.  You hear a "flushing" sound when the PICC is flushed.  You have pain, discomfort, or numbness in your arm, shoulder, or jaw on the same side as the PICC.  You feel your heart "racing" or skipping beats.  You notice a hole or tear in the PICC.  You develop chills or a fever. MAKE SURE YOU:   Understand these instructions.  Will watch your condition.  Will get help right away if you are not doing well or get worse.   This information is not intended to replace advice given to you by your health care provider. Make sure you discuss any questions you have with your health care provider.   Document Released: 12/19/2002 Document Revised: 07/05/2014 Document Reviewed: 02/19/2013 Elsevier Interactive Patient Education 2016 [employer] Inc[employer]  

## 2015-04-25 NOTE — Progress Notes (Signed)
Peripherally Inserted Central Catheter/Midline Placement  The IV Nurse has discussed with the patient and/or persons authorized to consent for the patient, the purpose of this procedure and the potential benefits and risks involved with this procedure.  The benefits include less needle sticks, lab draws from the catheter and patient may be discharged home with the catheter.  Risks include, but not limited to, infection, bleeding, blood clot (thrombus formation), and puncture of an artery; nerve damage and irregular heat beat.  Alternatives to this procedure were also discussed.  PICC/Midline Placement Documentation  PICC / Midline Single Lumen 83/29/19 PICC Right Basilic 43 cm 0 cm (Active)  Indication for Insertion or Continuance of Line Vasoactive infusions;Limited venous access - need for IV therapy >5 days (PICC only);Poor Vasculature-patient has had multiple peripheral attempts or PIVs lasting less than 24 hours 04/25/2015 10:20 AM  Exposed Catheter (cm) 0 cm 04/25/2015 10:20 AM  Site Assessment Clean;Dry;Intact 04/25/2015 10:20 AM  Line Status Flushed;Saline locked;Blood return noted 04/25/2015 10:20 AM  Dressing Type Transparent 04/25/2015 10:20 AM  Dressing Status Clean;Dry;Intact 04/25/2015 10:20 AM       Corion Sherrod, Anderson [place] 04/25/2015, 10:37 AM picc line ok to use. If patient pulls out need to send her to interventional radiology so picc line can be sutured in.   Roselind Messier RN PICC nurse

## 2015-04-29 ENCOUNTER — Other Ambulatory Visit (HOSPITAL_COMMUNITY): Payer: Self-pay | Admitting: Internal Medicine

## 2015-04-29 ENCOUNTER — Ambulatory Visit (HOSPITAL_COMMUNITY): Admission: RE | Admit: 2015-04-29 | Payer: Medicare Other | Source: Ambulatory Visit

## 2015-04-29 ENCOUNTER — Ambulatory Visit (HOSPITAL_COMMUNITY)
Admission: RE | Admit: 2015-04-29 | Discharge: 2015-04-29 | Disposition: A | Discharge status: Home / Self Care | Payer: Medicare Other | Source: Ambulatory Visit | Attending: Internal Medicine | Admitting: Internal Medicine

## 2015-04-29 DIAGNOSIS — N390 Urinary tract infection, site not specified: Secondary | ICD-10-CM

## 2015-04-29 DIAGNOSIS — B999 Unspecified infectious disease: Secondary | ICD-10-CM

## 2015-04-29 DIAGNOSIS — F0390 Unspecified dementia without behavioral disturbance: Secondary | ICD-10-CM | POA: Insufficient documentation

## 2015-04-29 MED ORDER — LIDOCAINE HCL 1 % IJ SOLN
INTRAMUSCULAR | Status: AC
  Filled 2015-04-29: qty 20

## 2015-04-29 MED ORDER — HEPARIN SOD (PORK) LOCK FLUSH 100 UNIT/ML IV SOLN
INTRAVENOUS | Status: DC
Start: 2015-04-29 — End: 2015-04-30
  Filled 2015-04-29: qty 5

## 2015-04-29 NOTE — Procedures (Signed)
RUE PICC 40 cm SVC RA No comp/EBL 

## 2015-05-01 ENCOUNTER — Other Ambulatory Visit (HOSPITAL_COMMUNITY): Payer: Self-pay

## 2015-05-05 ENCOUNTER — Encounter (HOSPITAL_COMMUNITY): Payer: Medicare Other | Attending: Oncology

## 2015-05-05 DIAGNOSIS — N183 Chronic kidney disease, stage 3 unspecified: Secondary | ICD-10-CM

## 2015-05-05 DIAGNOSIS — D696 Thrombocytopenia, unspecified: Secondary | ICD-10-CM | POA: Insufficient documentation

## 2015-05-05 DIAGNOSIS — D62 Acute posthemorrhagic anemia: Secondary | ICD-10-CM

## 2015-05-05 DIAGNOSIS — D649 Anemia, unspecified: Secondary | ICD-10-CM | POA: Insufficient documentation

## 2015-05-05 LAB — RETICULOCYTES
RBC.: 3.72 MIL/uL — AB (ref 3.87–5.11)
RETIC COUNT ABSOLUTE: 44.6 10*3/uL (ref 19.0–186.0)
RETIC CT PCT: 1.2 % (ref 0.4–3.1)

## 2015-05-05 LAB — CBC WITH DIFFERENTIAL/PLATELET
BASOS ABS: 0 10*3/uL (ref 0.0–0.1)
BASOS PCT: 0 %
EOS ABS: 0.1 10*3/uL (ref 0.0–0.7)
Eosinophils Relative: 3 %
HEMATOCRIT: 34.1 % — AB (ref 36.0–46.0)
HEMOGLOBIN: 11 g/dL — AB (ref 12.0–15.0)
Lymphocytes Relative: 39 %
Lymphs Abs: 1.8 10*3/uL (ref 0.7–4.0)
MCH: 29.6 pg (ref 26.0–34.0)
MCHC: 32.3 g/dL (ref 30.0–36.0)
MCV: 91.7 fL (ref 78.0–100.0)
Monocytes Absolute: 0.6 10*3/uL (ref 0.1–1.0)
Monocytes Relative: 13 %
NEUTROS ABS: 2 10*3/uL (ref 1.7–7.7)
NEUTROS PCT: 45 %
Platelets: 192 10*3/uL (ref 150–400)
RBC: 3.72 MIL/uL — AB (ref 3.87–5.11)
RDW: 14.7 % (ref 11.5–15.5)
WBC: 4.5 10*3/uL (ref 4.0–10.5)

## 2015-05-06 LAB — ERYTHROPOIETIN: ERYTHROPOIETIN: 18.6 m[IU]/mL — AB (ref 2.6–18.5)

## 2015-05-21 ENCOUNTER — Encounter (HOSPITAL_BASED_OUTPATIENT_CLINIC_OR_DEPARTMENT_OTHER): Payer: Medicare Other | Admitting: Oncology

## 2015-05-21 ENCOUNTER — Other Ambulatory Visit (HOSPITAL_COMMUNITY): Payer: Medicare Other

## 2015-05-21 ENCOUNTER — Encounter (HOSPITAL_COMMUNITY): Payer: Self-pay | Admitting: Oncology

## 2015-05-21 ENCOUNTER — Encounter (HOSPITAL_BASED_OUTPATIENT_CLINIC_OR_DEPARTMENT_OTHER): Payer: Medicare Other

## 2015-05-21 VITALS — BP 67/48 | HR 405 | Resp 20

## 2015-05-21 DIAGNOSIS — N183 Chronic kidney disease, stage 3 (moderate): Secondary | ICD-10-CM

## 2015-05-21 DIAGNOSIS — D696 Thrombocytopenia, unspecified: Secondary | ICD-10-CM

## 2015-05-21 DIAGNOSIS — D631 Anemia in chronic kidney disease: Secondary | ICD-10-CM

## 2015-05-21 LAB — CBC WITH DIFFERENTIAL/PLATELET
BASOS ABS: 0 10*3/uL (ref 0.0–0.1)
BASOS PCT: 0 %
EOS ABS: 0.2 10*3/uL (ref 0.0–0.7)
EOS PCT: 3 %
HCT: 36.2 % (ref 36.0–46.0)
Hemoglobin: 11.4 g/dL — ABNORMAL LOW (ref 12.0–15.0)
Lymphocytes Relative: 53 %
Lymphs Abs: 3.2 10*3/uL (ref 0.7–4.0)
MCH: 28.5 pg (ref 26.0–34.0)
MCHC: 31.5 g/dL (ref 30.0–36.0)
MCV: 90.5 fL (ref 78.0–100.0)
MONO ABS: 0.5 10*3/uL (ref 0.1–1.0)
Monocytes Relative: 8 %
Neutro Abs: 2.2 10*3/uL (ref 1.7–7.7)
Neutrophils Relative %: 36 %
PLATELETS: 224 10*3/uL (ref 150–400)
RBC: 4 MIL/uL (ref 3.87–5.11)
RDW: 14.9 % (ref 11.5–15.5)
WBC: 6.1 10*3/uL (ref 4.0–10.5)

## 2015-05-21 LAB — COMPREHENSIVE METABOLIC PANEL
ALBUMIN: 2.9 g/dL — AB (ref 3.5–5.0)
ALT: 12 U/L — ABNORMAL LOW (ref 14–54)
ANION GAP: 8 (ref 5–15)
AST: 29 U/L (ref 15–41)
Alkaline Phosphatase: 63 U/L (ref 38–126)
BUN: 16 mg/dL (ref 6–20)
CO2: 26 mmol/L (ref 22–32)
Calcium: 8.5 mg/dL — ABNORMAL LOW (ref 8.9–10.3)
Chloride: 102 mmol/L (ref 101–111)
Creatinine, Ser: 1.4 mg/dL — ABNORMAL HIGH (ref 0.44–1.00)
GFR calc Af Amer: 40 mL/min — ABNORMAL LOW (ref >60)
GFR calc non Af Amer: 35 mL/min — ABNORMAL LOW (ref >60)
GLUCOSE: 118 mg/dL — AB (ref 65–99)
POTASSIUM: 3.7 mmol/L (ref 3.5–5.1)
SODIUM: 136 mmol/L (ref 135–145)
TOTAL PROTEIN: 6.6 g/dL (ref 6.5–8.1)
Total Bilirubin: 0.4 mg/dL (ref 0.3–1.2)

## 2015-05-21 LAB — C-REACTIVE PROTEIN: CRP: 2.1 mg/dL — ABNORMAL HIGH (ref <1.0)

## 2015-05-21 NOTE — Progress Notes (Signed)
LABS DRAWN

## 2015-05-21 NOTE — Patient Instructions (Signed)
[place] at [facility] Discharge Instructions  RECOMMENDATIONS MADE BY THE CONSULTANT AND ANY TEST RESULTS WILL BE SENT TO YOUR REFERRING PHYSICIAN.  Exam and discussion by Robynn Pane, PA-C Will check some labs today and if any concerns we will contact your care provider.  Labs and office visit in 3 months.  Thank you for choosing [place] at [facility] to provide your oncology and hematology care.  To afford each patient quality time with our provider, please arrive at least 15 minutes before your scheduled appointment time.    You need to re-schedule your appointment should you arrive 10 or more minutes late.  We strive to give you quality time with our providers, and arriving late affects you and other patients whose appointments are after yours.  Also, if you no show three or more times for appointments you may be dismissed from the clinic at the providers discretion.     Again, thank you for choosing [facility].  Our hope is that these requests will decrease the amount of time that you wait before being seen by our physicians.       _____________________________________________________________  Should you have questions after your visit to [facility], please contact our office at [phone] between the hours of 8:30 a.m. and 4:30 p.m.  Voicemails left after 4:30 p.m. will not be returned until the following business day.  For prescription refill requests, have your pharmacy contact our office.

## 2015-05-21 NOTE — Assessment & Plan Note (Addendum)
Thrombocytopenia, since April 2016.  Peripheral work-up has been negative.  Intermittent normochromic, normocytic anemia in the setting of Stage III renal disease.  Negative peripheral work-up.  Improving since recent hospitalization.  Labs today: CBC diff, CMET, CRP  Today is the first time meeting the patient.  She appears very pale to me clinically and therefore, I will ask nursing to draw a pink top in the event that she is severely anemic.  She is otherwise asymptomatic, but due to her dementia, I am not sure she would be able to tell me if she were symptomatic.    Labs in 3 months: CBC diff  Return in 3 months for follow-up.

## 2015-05-21 NOTE — Progress Notes (Signed)
Brandy Kilts, MD [place] [id number]  Thrombocytopenia [facility]) - Plan: Multiple Vitamin (MULTIVITAMIN) tablet, CBC with Differential, Comprehensive metabolic panel, C-reactive protein, CBC with Differential, CBC with Differential, Comprehensive metabolic panel, C-reactive protein  CURRENT THERAPY: Observation  INTERVAL HISTORY: Brandy Mcguire 81 y.o. female returns for followup of thrombocytopenia with recent normocytic, normochromic anemia with improvement and stability.   I personally reviewed and went over laboratory results with the patient.  The results are noted within this dictation.  We will update labs today.  She denies any blood in stool, black tarry stool, hematuria, vaginal bleeding, hemoptysis, gingival bleeding, epistaxis.  She denies any complaints.    Past Medical History  Diagnosis Date  . Stroke ([place])   . CAD (coronary artery disease)   . Hypertension   . Bite from cat 03/17/2011  . CHF (congestive heart failure) ([place])   . PAF (paroxysmal atrial fibrillation) ([place])   . Hypothyroidism   . Dementia   . CKD (chronic kidney disease) stage 3, GFR 30-59 ml/min 09/28/2014  . Thoracoabdominal aortic aneurysm ([place]) 09/29/2014    6.4 x 6.0 cm; previously 4.2 cm.  . Traumatic hematoma of buttock 09/28/2014  . A-fib ([place])   . Renal artery stenosis ([facility])     Status post left renal stent.  . Multiple bilateral plumonary nodules  06/07/2011  . Sepsis due to enterococcus ([place]) 12/10/2014  . Chronic diastolic CHF (congestive heart failure) ([place]) 03/15/2015    EF 60-65%  . Pulmonary hypertension ([place]) 03/15/2015    43 mm Hg    has Bite from cat; Ascending and descending thoracic aortic aneurysms ; Multiple bilateral plumonary nodules ; Atrial fibrillation with rapid ventricular response ([place]); CAD (coronary artery disease); HTN (hypertension); History of CVA (cerebrovascular accident); Renal artery stenosis, left; Cognitive decline; Sepsis  associated hypotension ([place]); Volume depletion; Sepsis ([place]); Recurrent UTI; Lactic acidosis; Dementia with behavioral disturbance; PAF (paroxysmal atrial fibrillation) ([place]); Hypothyroidism; Acute kidney injury ([place]); CKD (chronic kidney disease) stage 3, GFR 30-59 ml/min; Traumatic hematoma of buttock; Thoracoabdominal aortic aneurysm ([place]); Acute blood loss anemia; Urinary retention; Sepsis due to enterococcus ([place]); Encephalopathy acute; Dementia; Altered mental status; UTI (urinary tract infection) due to Enterococcus; UTI (lower urinary tract infection); Chronic respiratory failure ([place]); Failure to thrive in adult; UTI (urinary tract infection); Dehydration; Thrombocytopenia ([place]); Constipation; Leukopenia; Anemia; Essential hypertension; Hypokalemia; Chronic diastolic CHF (congestive heart failure) ([place]); and Pulmonary hypertension ([place]) on her problem list.     has No Known Allergies.  Current Outpatient Prescriptions on File Prior to Visit  Medication Sig Dispense Refill  . Amino Acids-Protein Hydrolys (FEEDING SUPPLEMENT, PRO-STAT SUGAR FREE 64,) LIQD Take 30 mLs by mouth daily.    Marland Kitchen atorvastatin (LIPITOR) 40 MG tablet Take 40 mg by mouth at bedtime.    . Cholecalciferol (VITAMIN D3) 1000 UNITS CAPS Take by mouth.    . clopidogrel (PLAVIX) 75 MG tablet Take 1 tablet (75 mg total) by mouth at bedtime. Resume in 1 week if hemoglobin stable (Patient taking differently: Take 75 mg by mouth at bedtime. )    . diltiazem (CARDIZEM) 30 MG tablet Take 1 tablet (30 mg total) by mouth 3 (three) times daily.    Marland Kitchen ipratropium-albuterol (DUONEB) 0.5-2.5 (3) MG/3ML SOLN Take 3 mLs by nebulization every 6 (six) hours. 360 mL   . levothyroxine (SYNTHROID, LEVOTHROID) 150 MCG tablet Take 1 tablet (150 mcg total) by mouth daily before breakfast.    . LORazepam (  ATIVAN) 0.5 MG tablet Take 0.25 mg by mouth every 6 (six) hours as needed for anxiety.    . Magnesium Oxide 400 (240 MG) MG TABS Take 1 tablet by mouth  daily.    . potassium chloride SA (K-DUR,KLOR-CON) 20 MEQ tablet Take 1 tablet (20 mEq total) by mouth daily.    Marland Kitchen senna-docusate (SENOKOT-S) 8.6-50 MG per tablet Take 1 tablet by mouth daily.    . tamsulosin (FLOMAX) 0.4 MG CAPS capsule Take 1 capsule (0.4 mg total) by mouth daily after supper. 30 capsule 1  . traZODone (DESYREL) 50 MG tablet Take 50 mg by mouth at bedtime as needed.     . ciprofloxacin (CIPRO) 250 MG tablet Take 1 tablet (250 mg total) by mouth 2 (two) times daily. Continue until 03/17/15. (Patient not taking: Reported on 04/21/2015)     No current facility-administered medications on file prior to visit.    Past Surgical History  Procedure Laterality Date  . Stents       kidneys and 2 in heart  . Abdominal hysterectomy      Denies any headaches, dizziness, double vision, fevers, chills, night sweats, nausea, vomiting, diarrhea, constipation, chest pain, heart palpitations, shortness of breath, blood in stool, black tarry stool, urinary pain, urinary burning, urinary frequency, hematuria.   PHYSICAL EXAMINATION  ECOG PERFORMANCE STATUS: 3 - Symptomatic, >50% confined to bed  Filed Vitals:   05/21/15 1045  BP: 67/48  Pulse: 405  Resp: 20    GENERAL:alert, comfortable, cooperative, smiling and unaccompanied during office visit, pale complexion. SKIN: skin color, texture, turgor are normal, no rashes or significant lesions, multiple ecchymoses HEAD: Normocephalic, No masses, lesions, tenderness or abnormalities EYES: normal, PERRLA, EOMI, Conjunctiva are pink and non-injected EARS: External ears normal OROPHARYNX:lips, buccal mucosa, and tongue normal and mucous membranes are moist  NECK: supple, trachea midline LYMPH:  no palpable lymphadenopathy BREAST:not examined LUNGS: clear to auscultation  HEART: regular rate & rhythm ABDOMEN:abdomen soft and normal bowel sounds BACK: Back symmetric, no curvature. EXTREMITIES:less then 2 second capillary refill, no  joint deformities, effusion, or inflammation, no skin discoloration, no cyanosis  NEURO: positive findings: demented and pleasantly confused   LABORATORY DATA: CBC    Component Value Date/Time   WBC 4.5 05/05/2015 1128   RBC 3.72* 05/05/2015 1128   RBC 3.72* 05/05/2015 1128   HGB 11.0* 05/05/2015 1128   HCT 34.1* 05/05/2015 1128   PLT 192 05/05/2015 1128   MCV 91.7 05/05/2015 1128   MCH 29.6 05/05/2015 1128   MCHC 32.3 05/05/2015 1128   RDW 14.7 05/05/2015 1128   LYMPHSABS 1.8 05/05/2015 1128   MONOABS 0.6 05/05/2015 1128   EOSABS 0.1 05/05/2015 1128   BASOSABS 0.0 05/05/2015 1128      Chemistry      Component Value Date/Time   NA 139 03/15/2015 1342   K 3.9 03/15/2015 1342   CL 111 03/15/2015 1342   CO2 24 03/15/2015 1342   BUN 17 03/15/2015 1342   CREATININE 1.94* 03/15/2015 1342      Component Value Date/Time   CALCIUM 7.2* 03/15/2015 1342   ALKPHOS 44 12/24/2014 1246   AST 29 12/24/2014 1246   ALT 11* 12/24/2014 1246   BILITOT 0.8 12/24/2014 1246        PENDING LABS:   RADIOGRAPHIC STUDIES:  Ir US Guide Vasc Access Right  04/29/2015  CLINICAL DATA:  Dementia EXAM: RIGHT UPPER EXTREMITY PICC LINE PLACEMENT WITH ULTRASOUND AND FLUOROSCOPIC GUIDANCE FLUOROSCOPY TIME:  18 seconds PROCEDURE:  The patient was advised of the possible risks andcomplications and agreed to undergo the procedure. The patient was then brought to the angiographic suite for the procedure. The right arm was prepped with chlorhexidine, drapedin the usual sterile fashion using maximum barrier technique (cap and mask, sterile gown, sterile gloves, large sterile sheet, hand hygiene and cutaneous antisepsis) and infiltrated locally with 1% Lidocaine. Ultrasound demonstrated patency of the right basilic vein, and this was documented with an image. Under real-time ultrasound guidance, this vein was accessed with a 21 gauge micropuncture needle and image documentation was performed. A 0.018 wire was  introduced in to the vein. Over this, a 5 [place] single lumen power PICC was advanced to the lower SVC/right atrial junction. Fluoroscopy during the procedure and fluoro spot radiograph confirms appropriate catheter position. The catheter was flushed and covered with asterile dressing. Catheter length: 40 cm COMPLICATIONS: None IMPRESSION: Successful right arm power PICC line placement with ultrasound and fluoroscopic guidance. The catheter is ready for use. Electronically Signed   By: Marybelle Killings M.D.   On: 04/29/2015 17:27   Dg Chest Port 1 View  04/25/2015  CLINICAL DATA:  PICC line placement. EXAM: PORTABLE CHEST 1 VIEW COMPARISON:  03/14/2015 and prior exams FINDINGS: Cardiomegaly and thoracic aortic aneurysm again identified. A right-sided PICC line is present with tip overlying the superior cavoatrial junction. There is no evidence of focal airspace disease, pulmonary edema, suspicious pulmonary nodule/mass, pleural effusion, or pneumothorax. No acute bony abnormalities are identified. IMPRESSION: Right PICC line with tip overlying the superior cavoatrial junction. Cardiomegaly and thoracic aortic aneurysm again identified. Electronically Signed   By: Margarette Canada M.D.   On: 04/25/2015 10:52   Ir Fluoro Guide Cv Midline Picc Right  04/29/2015  CLINICAL DATA:  Dementia EXAM: RIGHT UPPER EXTREMITY PICC LINE PLACEMENT WITH ULTRASOUND AND FLUOROSCOPIC GUIDANCE FLUOROSCOPY TIME:  18 seconds PROCEDURE: The patient was advised of the possible risks andcomplications and agreed to undergo the procedure. The patient was then brought to the angiographic suite for the procedure. The right arm was prepped with chlorhexidine, drapedin the usual sterile fashion using maximum barrier technique (cap and mask, sterile gown, sterile gloves, large sterile sheet, hand hygiene and cutaneous antisepsis) and infiltrated locally with 1% Lidocaine. Ultrasound demonstrated patency of the right basilic vein, and this was  documented with an image. Under real-time ultrasound guidance, this vein was accessed with a 21 gauge micropuncture needle and image documentation was performed. A 0.018 wire was introduced in to the vein. Over this, a 5 [place] single lumen power PICC was advanced to the lower SVC/right atrial junction. Fluoroscopy during the procedure and fluoro spot radiograph confirms appropriate catheter position. The catheter was flushed and covered with asterile dressing. Catheter length: 40 cm COMPLICATIONS: None IMPRESSION: Successful right arm power PICC line placement with ultrasound and fluoroscopic guidance. The catheter is ready for use. Electronically Signed   By: Marybelle Killings M.D.   On: 04/29/2015 17:27     PATHOLOGY:    ASSESSMENT AND PLAN:  Thrombocytopenia Thrombocytopenia, since April 2016.  Peripheral work-up has been negative.  Intermittent normochromic, normocytic anemia in the setting of Stage III renal disease.  Negative peripheral work-up.  Improving since recent hospitalization.  Labs today: CBC diff, CMET, CRP  Today is the first time meeting the patient.  She appears very pale to me clinically and therefore, I will ask nursing to draw a pink top in the event that she is severely anemic.  She is otherwise asymptomatic,  but due to her dementia, I am not sure she would be able to tell me if she were symptomatic.    Labs in 3 months: CBC diff  Return in 3 months for follow-up.    THERAPY PLAN:  Continued observation  All questions were answered. The patient knows to call the clinic with any problems, questions or concerns. We can certainly see the patient much sooner if necessary.  Patient and plan discussed with Dr. Ancil Linsey and she is in agreement with the aforementioned.   This note is electronically signed by: Doy Mince 05/21/2015 11:47 AM

## 2015-05-23 LAB — SAMPLE TO BLOOD BANK

## 2015-06-27 ENCOUNTER — Emergency Department (HOSPITAL_COMMUNITY): Payer: Medicare Other

## 2015-06-27 ENCOUNTER — Observation Stay (HOSPITAL_COMMUNITY)
Admission: EM | Admit: 2015-06-27 | Discharge: 2015-06-27 | Disposition: A | Discharge status: Home / Self Care | Payer: Medicare Other | Attending: Internal Medicine | Admitting: Internal Medicine

## 2015-06-27 ENCOUNTER — Encounter (HOSPITAL_COMMUNITY): Payer: Self-pay

## 2015-06-27 DIAGNOSIS — I129 Hypertensive chronic kidney disease with stage 1 through stage 4 chronic kidney disease, or unspecified chronic kidney disease: Secondary | ICD-10-CM | POA: Diagnosis not present

## 2015-06-27 DIAGNOSIS — F0390 Unspecified dementia without behavioral disturbance: Secondary | ICD-10-CM | POA: Diagnosis not present

## 2015-06-27 DIAGNOSIS — R4182 Altered mental status, unspecified: Secondary | ICD-10-CM | POA: Diagnosis not present

## 2015-06-27 DIAGNOSIS — I959 Hypotension, unspecified: Secondary | ICD-10-CM

## 2015-06-27 DIAGNOSIS — Z79899 Other long term (current) drug therapy: Secondary | ICD-10-CM | POA: Diagnosis not present

## 2015-06-27 DIAGNOSIS — R7989 Other specified abnormal findings of blood chemistry: Secondary | ICD-10-CM | POA: Insufficient documentation

## 2015-06-27 DIAGNOSIS — I2510 Atherosclerotic heart disease of native coronary artery without angina pectoris: Secondary | ICD-10-CM | POA: Diagnosis not present

## 2015-06-27 DIAGNOSIS — N189 Chronic kidney disease, unspecified: Secondary | ICD-10-CM | POA: Insufficient documentation

## 2015-06-27 DIAGNOSIS — I9589 Other hypotension: Secondary | ICD-10-CM | POA: Diagnosis not present

## 2015-06-27 DIAGNOSIS — R000 Tachycardia, unspecified: Secondary | ICD-10-CM | POA: Diagnosis not present

## 2015-06-27 DIAGNOSIS — Z87891 Personal history of nicotine dependence: Secondary | ICD-10-CM | POA: Diagnosis not present

## 2015-06-27 DIAGNOSIS — I5032 Chronic diastolic (congestive) heart failure: Secondary | ICD-10-CM | POA: Diagnosis not present

## 2015-06-27 DIAGNOSIS — Z8619 Personal history of other infectious and parasitic diseases: Secondary | ICD-10-CM | POA: Diagnosis not present

## 2015-06-27 DIAGNOSIS — E039 Hypothyroidism, unspecified: Secondary | ICD-10-CM | POA: Insufficient documentation

## 2015-06-27 DIAGNOSIS — I509 Heart failure, unspecified: Secondary | ICD-10-CM | POA: Diagnosis not present

## 2015-06-27 DIAGNOSIS — Z8673 Personal history of transient ischemic attack (TIA), and cerebral infarction without residual deficits: Secondary | ICD-10-CM | POA: Diagnosis not present

## 2015-06-27 DIAGNOSIS — I716 Thoracoabdominal aortic aneurysm, without rupture: Secondary | ICD-10-CM | POA: Diagnosis not present

## 2015-06-27 DIAGNOSIS — N179 Acute kidney failure, unspecified: Secondary | ICD-10-CM

## 2015-06-27 DIAGNOSIS — R778 Other specified abnormalities of plasma proteins: Secondary | ICD-10-CM

## 2015-06-27 LAB — I-STAT CHEM 8, ED
BUN: 96 mg/dL — ABNORMAL HIGH (ref 6–20)
CALCIUM ION: 0.96 mmol/L — AB (ref 1.13–1.30)
Chloride: 125 mmol/L — ABNORMAL HIGH (ref 101–111)
Creatinine, Ser: 5.1 mg/dL — ABNORMAL HIGH (ref 0.44–1.00)
GLUCOSE: 81 mg/dL (ref 65–99)
HCT: 40 % (ref 36.0–46.0)
HEMOGLOBIN: 13.6 g/dL (ref 12.0–15.0)
POTASSIUM: 6 mmol/L — AB (ref 3.5–5.1)
SODIUM: 156 mmol/L — AB (ref 135–145)
TCO2: 19 mmol/L (ref 0–100)

## 2015-06-27 LAB — I-STAT TROPONIN, ED: TROPONIN I, POC: 0.46 ng/mL — AB (ref 0.00–0.08)

## 2015-06-27 MED ORDER — LORAZEPAM 2 MG/ML IJ SOLN
0.5000 mg | Freq: Once | INTRAMUSCULAR | Status: AC
  Administered 2015-06-27: 0.5 mg via INTRAVENOUS
  Filled 2015-06-27: qty 1

## 2015-06-27 MED ORDER — ACETAMINOPHEN 650 MG RE SUPP: 650.0000 mg | Freq: Four times a day (QID) | RECTAL | Status: DC | PRN

## 2015-06-27 MED ORDER — SCOPOLAMINE 1 MG/3DAYS TD PT72
MEDICATED_PATCH | TRANSDERMAL | Status: AC
  Filled 2015-06-27: qty 1

## 2015-06-27 MED ORDER — MORPHINE SULFATE (PF) 2 MG/ML IV SOLN
2.0000 mg | Freq: Once | INTRAVENOUS | Status: AC
  Administered 2015-06-27: 2 mg via INTRAVENOUS
  Filled 2015-06-27: qty 1

## 2015-06-27 MED ORDER — ACETAMINOPHEN 325 MG PO TABS
650.0000 mg | ORAL_TABLET | Freq: Four times a day (QID) | ORAL | Status: DC | PRN
Start: 2015-06-27 — End: 2015-06-28

## 2015-06-27 MED ORDER — SODIUM CHLORIDE 0.9 % IV BOLUS (SEPSIS)
1000.0000 mL | Freq: Once | INTRAVENOUS | Status: AC
  Administered 2015-06-27: 1000 mL via INTRAVENOUS

## 2015-06-27 MED ORDER — LORAZEPAM 2 MG/ML IJ SOLN: 1.0000 mg | INTRAMUSCULAR | Status: DC | PRN

## 2015-06-27 MED ORDER — SCOPOLAMINE 1 MG/3DAYS TD PT72
1.0000 | MEDICATED_PATCH | TRANSDERMAL | Status: DC
  Administered 2015-06-27: 1.5 mg via TRANSDERMAL
  Filled 2015-06-27: qty 1

## 2015-06-27 MED ORDER — SODIUM CHLORIDE 0.9 % IV SOLN
1.0000 mg/h | INTRAVENOUS | Status: DC
  Administered 2015-06-27: 1 mg/h via INTRAVENOUS
  Filled 2015-06-27: qty 10

## 2015-06-28 NOTE — Progress Notes (Signed)
Patient expired at 2250.  Death verified by Julious Oka and Ronne Binning.  MD notified.  AC notified. And [employer] notified.  All post mortem care completed.  Patient transported to the morgue.

## 2015-06-29 NOTE — ED Notes (Addendum)
Pt unresponsive, makes sounds with painful stimuli  Color pale. Skin cool to touch. Feet mottled. Resp shallow. Dr Lacinda Axon notified

## 2015-06-29 NOTE — Discharge Summary (Addendum)
DEATH SUMMARY  Patient was a 82 year old woman initially admitted on the hospital on 07/12/2015. Her past medical history was significant for hypertension, hypothyroidism, coronary artery disease, dementia and failure to thrive. She was brought in from her skilled nursing facility after she was found to be unresponsive. Further history from the family relates that over the past 6 months she has rapidly declined has lost greater than 100 pounds and simply stopped eating. Upon arrival blood pressure was 40/30, she was tachycardic to the 140s she was cyanotic to her extremities, pale and tachypneic. She had a creatinine greater than 5 and a troponin of 0.16. Death appeared imminent. Appriopriately so, the emergency department physician discussed comfort care with family members and they agreed. Patient was admitted to provide comfort measures. Because of her tachypnea and restlessness which were interpreted as signs of discomfort, she was started on a morphine drip. She subsequently expired on 07/12/15 at 10:50 AM.  Causes of death -Probable acute myocardial infarction -Cardiogenic shock -Acute renal failure -Alzheimer's dementia -Adult failure to thrive  Domingo Mend, MD [facility] Pager: [phone]

## 2015-06-29 NOTE — ED Notes (Signed)
RN accompanied pt to CT  

## 2015-06-29 NOTE — H&P (Signed)
[facility]          History and Physical    PCP:   [name], MD   EDP: Nat Christen, MD  Chief Complaint:  Unresponsive   HPI: Patient is a 82 y/o woman with h/o CAD, HTN, hypothyroidism, dementia, FTT. Brought in from [facility] due to unresponsiveness. Upon arrival, BP is 40/30, tachycardic to 140s, cianotic, pale, tachypneic. Cr is >5, trop is 0.16. Death appears imminent. Appropriately so, EDP has discussed comfort care with family members and they agree. Plan to admit to provide comfort measures.  Allergies:  No Known Allergies    Past Medical History  Diagnosis Date  . Stroke ([place])   . CAD (coronary artery disease)   . Hypertension   . Bite from cat 03/17/2011  . CHF (congestive heart failure) ([place])   . PAF (paroxysmal atrial fibrillation) ([place])   . Hypothyroidism   . Dementia   . CKD (chronic kidney disease) stage 3, GFR 30-59 ml/min 09/28/2014  . Thoracoabdominal aortic aneurysm ([place]) 09/29/2014    6.4 x 6.0 cm; previously 4.2 cm.  . Traumatic hematoma of buttock 09/28/2014  . A-fib ([place])   . Renal artery stenosis ([facility])     Status post left renal stent.  . Multiple bilateral plumonary nodules  06/07/2011  . Sepsis due to enterococcus ([place]) 12/10/2014  . Chronic diastolic CHF (congestive heart failure) ([place]) 03/15/2015    EF 60-65%  . Pulmonary hypertension ([place]) 03/15/2015    43 mm Hg    Past Surgical History  Procedure Laterality Date  . Stents       kidneys and 2 in heart  . Abdominal hysterectomy      Prior to Admission medications   Medication Sig Start Date End Date Taking? Authorizing Provider  atorvastatin (LIPITOR) 40 MG tablet Take 40 mg by mouth at bedtime.   Yes Historical Provider, MD  Cholecalciferol (VITAMIN D3) 1000 UNITS CAPS Take 1 capsule by mouth daily.    Yes Historical Provider, MD  clopidogrel (PLAVIX) 75 MG tablet Take 1 tablet (75 mg total) by mouth at bedtime. Resume in 1 week if hemoglobin  stable Patient taking differently: Take 75 mg by mouth at bedtime.  10/03/14  Yes Kathie Dike, MD  diltiazem (CARDIZEM) 30 MG tablet Take 1 tablet (30 mg total) by mouth 3 (three) times daily. 03/15/15  Yes Rexene Alberts, MD  ipratropium-albuterol (DUONEB) 0.5-2.5 (3) MG/3ML SOLN Take 3 mLs by nebulization every 6 (six) hours. 10/03/14  Yes Kathie Dike, MD  levothyroxine (SYNTHROID, LEVOTHROID) 150 MCG tablet Take 1 tablet (150 mcg total) by mouth daily before breakfast. Patient taking differently: Take 125 mcg by mouth daily before breakfast.  03/15/15  Yes Rexene Alberts, MD  LORazepam (ATIVAN) 0.5 MG tablet Take 0.25 mg by mouth every 6 (six) hours as needed for anxiety.   Yes Historical Provider, MD  Magnesium Oxide 400 (240 MG) MG TABS Take 1 tablet by mouth daily. 03/15/15  Yes Rexene Alberts, MD  mirtazapine (REMERON) 7.5 MG tablet Take 7.5 mg by mouth at bedtime.   Yes Historical Provider, MD  Multiple Vitamin (MULTIVITAMIN) tablet Take 1 tablet by mouth daily.   Yes Historical Provider, MD  senna-docusate (SENOKOT-S) 8.6-50 MG per tablet Take 1 tablet by mouth daily.   Yes Historical Provider, MD  tamsulosin (FLOMAX) 0.4 MG CAPS capsule Take 1 capsule (0.4 mg total) by mouth daily after supper.  12/16/14  Yes Lezlie Octave Black, NP  traZODone (DESYREL) 50 MG tablet Take 50 mg by mouth at bedtime as needed.    Yes Historical Provider, MD    Social History:  reports that she quit smoking about 14 years ago. Her smoking use included Cigarettes. She has a 50 pack-year smoking history. She has never used smokeless tobacco. She reports that she does not drink alcohol or use illicit drugs.  Family History  Problem Relation Age of Onset  . Cancer Mother   . Cancer Sister   . COPD Sister   . Heart failure Sister     Review of Systems:  Unable to obtain given current mental state.  Physical Exam: Blood pressure 78/43, pulse 145, temperature 100.2 F (37.9 C), temperature source Rectal, resp. rate  22, SpO2 96 %. GEN: pale, cyanotic, restless, cachectic, emaciated with cheek wasting HEENT: Standard/AT, dry mucous membranes Neck: supple, no JVD, no LAD CV: tachy, regular Lungs: coarse Abd: S/ND/+BS Ext: no edema  Labs on Admission:  Results for orders placed or performed during the hospital encounter of 07-16-15 (from the past 48 hour(s))  I-stat Chem 8, ED     Status: Abnormal   Collection Time: 2015/07/16 11:47 AM  Result Value Ref Range   Sodium 156 (H) 135 - 145 mmol/L   Potassium 6.0 (H) 3.5 - 5.1 mmol/L   Chloride 125 (H) 101 - 111 mmol/L   BUN 96 (H) 6 - 20 mg/dL   Creatinine, Ser 5.10 (H) 0.44 - 1.00 mg/dL   Glucose, Bld 81 65 - 99 mg/dL   Calcium, Ion 0.96 (L) 1.13 - 1.30 mmol/L   TCO2 19 0 - 100 mmol/L   Hemoglobin 13.6 12.0 - 15.0 g/dL   HCT 40.0 36.0 - 46.0 %  I-stat troponin, ED     Status: Abnormal   Collection Time: 2015-07-16 11:57 AM  Result Value Ref Range   Troponin i, poc 0.46 (HH) 0.00 - 0.08 ng/mL   Comment NOTIFIED PHYSICIAN    Comment 3            Comment: Due to the release kinetics of cTnI, a negative result within the first hours of the onset of symptoms does not rule out myocardial infarction with certainty. If myocardial infarction is still suspected, repeat the test at appropriate intervals.     Radiological Exams on Admission: Ct Head Wo Contrast  07/16/2015  CLINICAL DATA:  Unresponsive. EXAM: CT HEAD WITHOUT CONTRAST TECHNIQUE: Contiguous axial images were obtained from the base of the skull through the vertex without intravenous contrast. COMPARISON:  CT scan of January 27, 2015. FINDINGS: Bony calvarium appears intact. Bilateral maxillary sinusitis is noted. Mild diffuse cortical atrophy is noted. Old left occipital infarction is noted No mass effect or midline shift is noted. Ventricular size is within normal limits. There is no evidence of mass lesion, hemorrhage or acute infarction. IMPRESSION: Bilateral maxillary sinusitis. Mild diffuse  cortical atrophy. Old left occipital infarction. No acute intracranial abnormality seen. Electronically Signed   By: Marijo Conception, M.D.   On: Jul 16, 2015 13:00   Dg Chest Port 1 View  07/16/2015  CLINICAL DATA:  Dyspnea. EXAM: PORTABLE CHEST 1 VIEW COMPARISON:  April 25, 2015. FINDINGS: Stable cardiomediastinal silhouette. Tortuosity enlargement of descending thoracic aorta is noted consistent with aneurysm. No pneumothorax or pleural effusion is noted. No acute pulmonary disease is noted. Bony thorax is intact. IMPRESSION: No acute cardiopulmonary abnormality seen. Electronically Signed   By: Marijo Conception,  M.D.   On: 2015-07-22 12:50    Assessment/Plan Active Problems:   Hypotension   ARF (acute renal failure) ([place])    Hypotension/Tachycardia/ARF -Appears to have massive dehydration, severe protein-caloric malnutrition and FTT. -Death is imminent. -Will admit to provide comfort measures. -Morphine drip, Ativan PRN, scopolamine  Time Spent on Admission: 45 minutes  [facility] Pager: [phone] 22-Jul-2015, 3:20 PM

## 2015-06-29 NOTE — ED Notes (Signed)
Dr Lacinda Axon at bedside. Spoke with son. Son en route to hospital.

## 2015-06-29 NOTE — ED Notes (Signed)
Pt present to ED non-verbal, unable to follow commands, responding only to pain. Bilateral feet mottling. Severe dyspnea. Unable to auscultate or palpate manual bp. EDP spoke with pt son, Pryor Montes intubation at this time, no cpr. IV access maintained and fluid bolus given.

## 2015-06-29 NOTE — ED Provider Notes (Signed)
History  By signing my name below, I, Marlowe Kays, attest that this documentation has been prepared under the direction and in the presence of Nat Christen, MD. Electronically Signed: Marlowe Kays, ED Scribe. 2015/07/22. 11:48 AM.  Chief Complaint  Patient presents with  . Altered Mental Status   LEVEL 5 CAVEAT- Full history could not be obtained due to patient unresponsive.  No language interpreter was used.    HPI Comments:  Brandy Mcguire is a 82 y.o. female brought in by EMS from [place] who presents to the Emergency Department unresponsive. EMS reports pt was found unresponsive PTA for an unknown amount of time only aroused by painful stimuli. Pt does not follow commands. She has h/o dementia and is normally only oriented to self. EMS states she has recently been treated for UTI. PMHx of CVA, HTN, CHF, atrial fibrillation, CKD and dementia.  Past Medical History  Diagnosis Date  . Stroke ([place])   . CAD (coronary artery disease)   . Hypertension   . Bite from cat 03/17/2011  . CHF (congestive heart failure) ([place])   . PAF (paroxysmal atrial fibrillation) ([place])   . Hypothyroidism   . Dementia   . CKD (chronic kidney disease) stage 3, GFR 30-59 ml/min 09/28/2014  . Thoracoabdominal aortic aneurysm ([place]) 09/29/2014    6.4 x 6.0 cm; previously 4.2 cm.  . Traumatic hematoma of buttock 09/28/2014  . A-fib ([place])   . Renal artery stenosis ([facility])     Status post left renal stent.  . Multiple bilateral plumonary nodules  06/07/2011  . Sepsis due to enterococcus ([place]) 12/10/2014  . Chronic diastolic CHF (congestive heart failure) ([place]) 03/15/2015    EF 60-65%  . Pulmonary hypertension ([place]) 03/15/2015    43 mm Hg   Past Surgical History  Procedure Laterality Date  . Stents       kidneys and 2 in heart  . Abdominal hysterectomy     Family History  Problem Relation Age of Onset  . Cancer Mother   . Cancer Sister   . COPD Sister   . Heart failure Sister    Social History   Substance Use Topics  . Smoking status: Former Smoker -- 1.00 packs/day for 50 years    Types: Cigarettes    Quit date: 04/03/2001  . Smokeless tobacco: Never Used  . Alcohol Use: No   OB History    Gravida Para Term Preterm AB TAB SAB Ectopic Multiple Living   7 7 7       5      Review of Systems  Unable to perform ROS: Patient unresponsive    LEVEL 5 CAVEAT- Full history could not be obtained due to patient unresponsive.   Allergies  Review of patient's allergies indicates no known allergies.  Home Medications   Prior to Admission medications   Medication Sig Start Date End Date Taking? Authorizing Provider  atorvastatin (LIPITOR) 40 MG tablet Take 40 mg by mouth at bedtime.   Yes Historical Provider, MD  Cholecalciferol (VITAMIN D3) 1000 UNITS CAPS Take 1 capsule by mouth daily.    Yes Historical Provider, MD  clopidogrel (PLAVIX) 75 MG tablet Take 1 tablet (75 mg total) by mouth at bedtime. Resume in 1 week if hemoglobin stable Patient taking differently: Take 75 mg by mouth at bedtime.  10/03/14  Yes Kathie Dike, MD  diltiazem (CARDIZEM) 30 MG tablet Take 1 tablet (30 mg total) by mouth 3 (three) times daily. 03/15/15  Yes Rexene Alberts, MD  ipratropium-albuterol (DUONEB) 0.5-2.5 (3) MG/3ML SOLN Take 3 mLs by nebulization every 6 (six) hours. 10/03/14  Yes Kathie Dike, MD  levothyroxine (SYNTHROID, LEVOTHROID) 150 MCG tablet Take 1 tablet (150 mcg total) by mouth daily before breakfast. Patient taking differently: Take 125 mcg by mouth daily before breakfast.  03/15/15  Yes Rexene Alberts, MD  LORazepam (ATIVAN) 0.5 MG tablet Take 0.25 mg by mouth every 6 (six) hours as needed for anxiety.   Yes Historical Provider, MD  Magnesium Oxide 400 (240 MG) MG TABS Take 1 tablet by mouth daily. 03/15/15  Yes Rexene Alberts, MD  mirtazapine (REMERON) 7.5 MG tablet Take 7.5 mg by mouth at bedtime.   Yes Historical Provider, MD  Multiple Vitamin (MULTIVITAMIN) tablet Take 1 tablet by mouth  daily.   Yes Historical Provider, MD  senna-docusate (SENOKOT-S) 8.6-50 MG per tablet Take 1 tablet by mouth daily.   Yes Historical Provider, MD  tamsulosin (FLOMAX) 0.4 MG CAPS capsule Take 1 capsule (0.4 mg total) by mouth daily after supper. 12/16/14  Yes Lezlie Octave Black, NP  traZODone (DESYREL) 50 MG tablet Take 50 mg by mouth at bedtime as needed.    Yes Historical Provider, MD   BP 80/60 mmHg  Pulse 145  Temp(Src) 100.2 F (37.9 C) (Rectal)  Resp 31  SpO2 98% Physical Exam  Constitutional: She appears well-developed and well-nourished. Face mask in place.  Chronically ill  HENT:  Head: Normocephalic and atraumatic.  Eyes: Conjunctivae are normal.  Neck: Neck supple.  Cardiovascular: Normal rate and regular rhythm.   Pulmonary/Chest: Effort normal and breath sounds normal.  Abdominal: Soft. Bowel sounds are normal.  Neurological: She is unresponsive.  Skin: Skin is warm and dry.  Nursing note and vitals reviewed.   ED Course  Procedures (including critical care time) DIAGNOSTIC STUDIES: Oxygen Saturation is 93% on face mask, low by my interpretation.   COORDINATION OF CARE: 11:44 AM- Will contact family. Will order IV fluids and labs.  Medications  sodium chloride 0.9 % bolus 1,000 mL (0 mLs Intravenous Stopped 07-10-2015 1244)  morphine 2 MG/ML injection 2 mg (2 mg Intravenous Given 10-Jul-2015 1329)    Labs Review Labs Reviewed  I-STAT CHEM 8, ED - Abnormal; Notable for the following:    Sodium 156 (*)    Potassium 6.0 (*)    Chloride 125 (*)    BUN 96 (*)    Creatinine, Ser 5.10 (*)    Calcium, Ion 0.96 (*)    All other components within normal limits  I-STAT TROPOININ, ED - Abnormal; Notable for the following:    Troponin i, poc 0.46 (*)    All other components within normal limits  URINALYSIS, ROUTINE W REFLEX MICROSCOPIC (NOT AT [facility])    Imaging Review Ct Head Wo Contrast  07-10-15  CLINICAL DATA:  Unresponsive. EXAM: CT HEAD WITHOUT CONTRAST TECHNIQUE:  Contiguous axial images were obtained from the base of the skull through the vertex without intravenous contrast. COMPARISON:  CT scan of January 27, 2015. FINDINGS: Bony calvarium appears intact. Bilateral maxillary sinusitis is noted. Mild diffuse cortical atrophy is noted. Old left occipital infarction is noted No mass effect or midline shift is noted. Ventricular size is within normal limits. There is no evidence of mass lesion, hemorrhage or acute infarction. IMPRESSION: Bilateral maxillary sinusitis. Mild diffuse cortical atrophy. Old left occipital infarction. No acute intracranial abnormality seen. Electronically Signed   By: Marijo Conception, M.D.   On: 2015-07-10 13:00   Dg [facility]  1 View  07-16-2015  CLINICAL DATA:  Dyspnea. EXAM: PORTABLE CHEST 1 VIEW COMPARISON:  April 25, 2015. FINDINGS: Stable cardiomediastinal silhouette. Tortuosity enlargement of descending thoracic aorta is noted consistent with aneurysm. No pneumothorax or pleural effusion is noted. No acute pulmonary disease is noted. Bony thorax is intact. IMPRESSION: No acute cardiopulmonary abnormality seen. Electronically Signed   By: Marijo Conception, M.D.   On: Jul 16, 2015 12:50   I have personally reviewed and evaluated these images and lab results as part of my medical decision-making.   EKG Interpretation None     CRITICAL CARE Performed by: Nat Christen Total critical care time: 40 minutes Critical care time was exclusive of separately billable procedures and treating other patients. Critical care was necessary to treat or prevent imminent or life-threatening deterioration. Critical care was time spent personally by me on the following activities: development of treatment plan with patient and/or surrogate as well as nursing, discussions with consultants, evaluation of patient's response to treatment, examination of patient, obtaining history from patient or surrogate, ordering and performing treatments and interventions,  ordering and review of laboratory studies, ordering and review of radiographic studies, pulse oximetry and re-evaluation of patient's condition. MDM   Final diagnoses:  Altered mental status, unspecified altered mental status type  Elevated troponin  Tachycardia   Discussed with son, Waldron Session, phone # [phone], and he did not want any heroic measures to be taken. Patient is in critical condition. She is unresponsive, tachycardic, hypotensive.   Troponin is elevated at 0.46. Chest x-ray shows no acute pathology. CT head reveals no bleeding. I discussed with the son and the daughter and the nephew the critical nature of her illness. I recommended that no heroics be initiated.   They understand and agree with comfort measures only. I personally performed the services described in this documentation, which was scribed in my presence. The recorded information has been reviewed and is accurate.      Nat Christen, MD 07/16/15 [phone]

## 2015-06-29 NOTE — ED Notes (Addendum)
Pt brought in by EMS from [place]. Found unresponsive except to painful stimuli prior to transport. No report given to first shift staff that pt was unresponsive. Unknown amt of time pt has been unresponsive

## 2015-06-29 DEATH — deceased

## 2015-08-21 ENCOUNTER — Other Ambulatory Visit (HOSPITAL_COMMUNITY): Payer: Medicare Other

## 2015-08-21 ENCOUNTER — Ambulatory Visit (HOSPITAL_COMMUNITY): Payer: Medicare Other | Admitting: Oncology

## 2016-03-02 IMAGING — CT CT HEAD W/O CM
1 series · 16 of 30 positions shown, 20 images · non-contrast
Comparison: 09/28/2014

CLINICAL DATA: Subsequent evaluation of confusion dementia after
fall, unsteady gait since 09/28/2014

EXAM:
CT HEAD WITHOUT CONTRAST
TECHNIQUE: Contiguous axial images were obtained from the base of the skull
through the vertex without intravenous contrast.

[Series 2: headseq 4.8 h37s · axial · 0.43mm/px · z∈[+99,+254]mm · 16 of 36 slices shown, 20 images]
[im 2/36  brain]
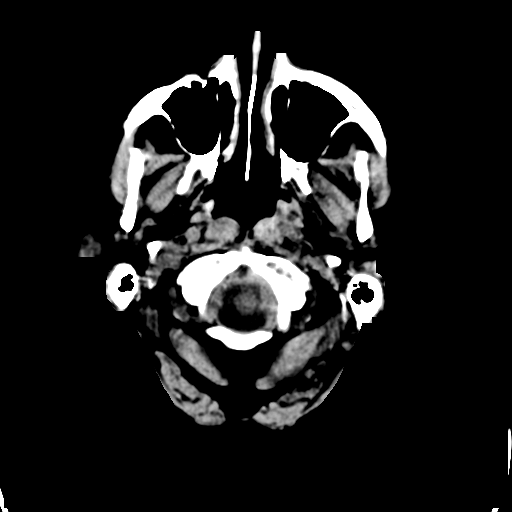
[im 2/36  bone]
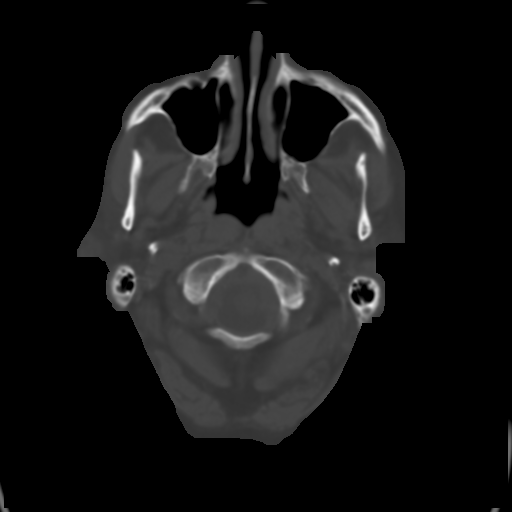
[im 4/36  brain]
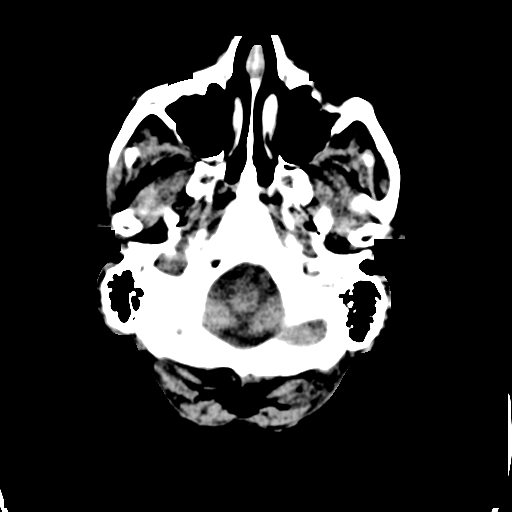
[im 7/36  brain]
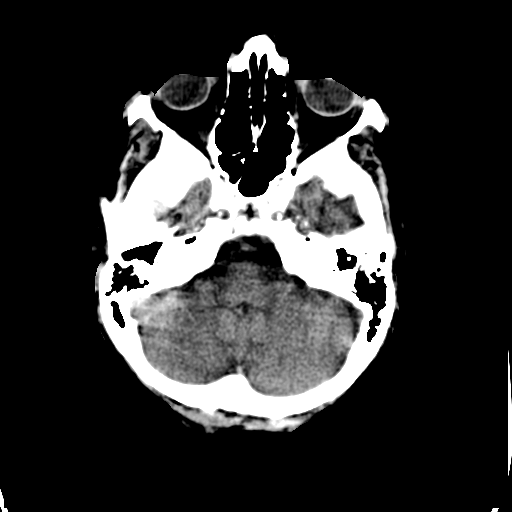
[im 9/36  brain]
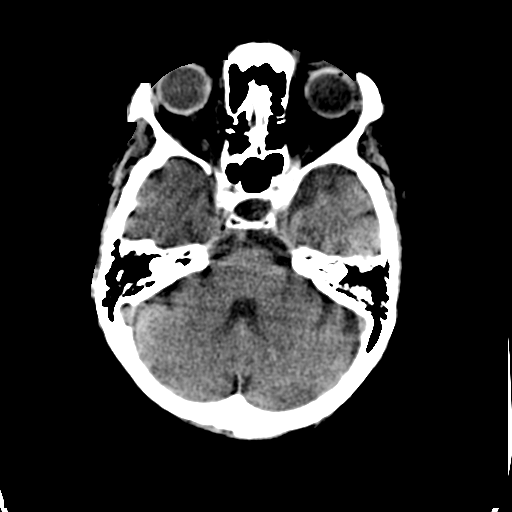
[im 10/36  brain]
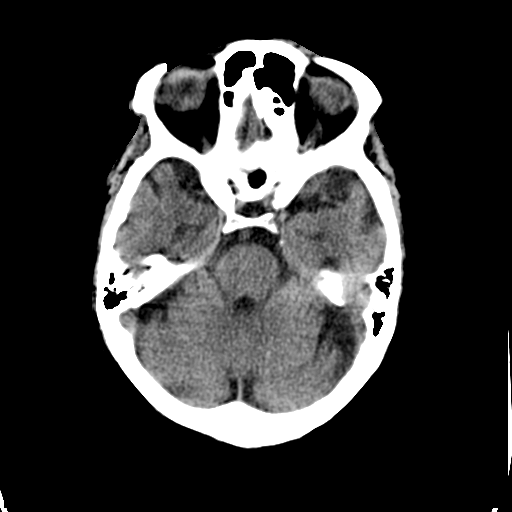
[im 10/36  bone]
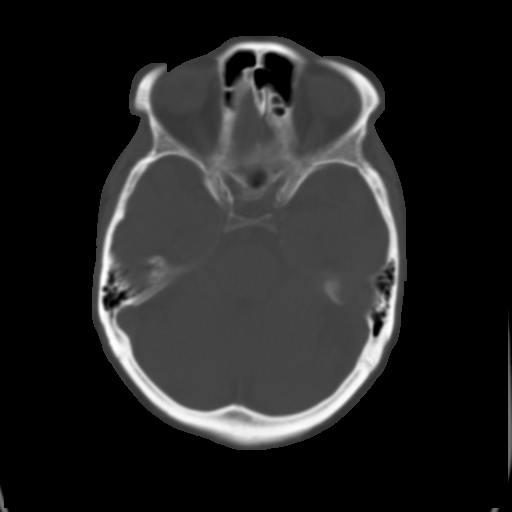
[im 13/36  brain]
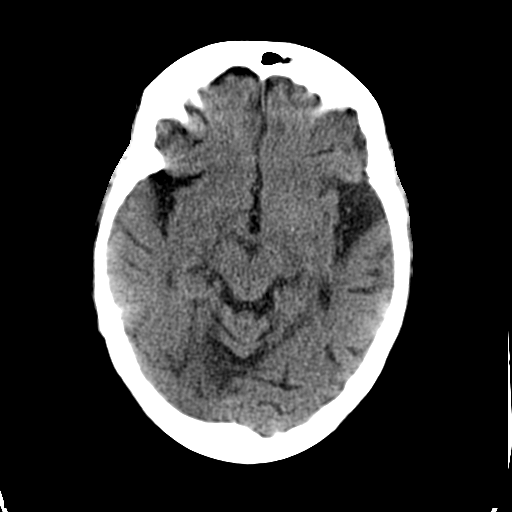
[im 15/36  brain]
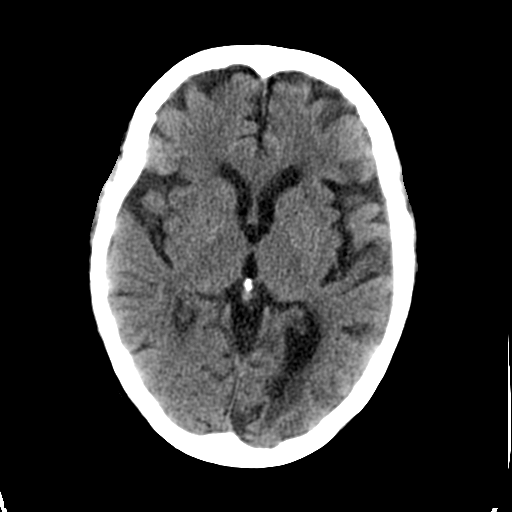
[im 17/36  brain]
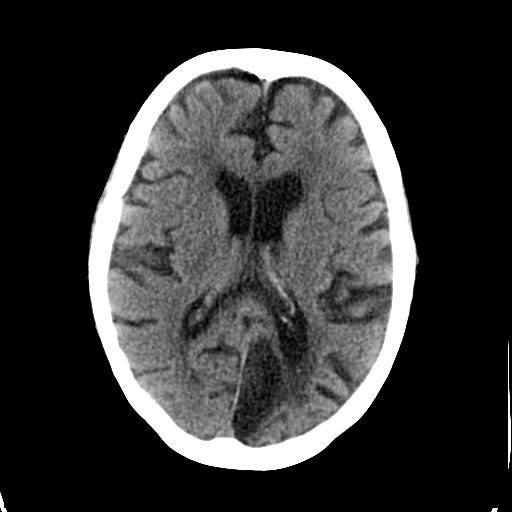
[im 19/36  brain]
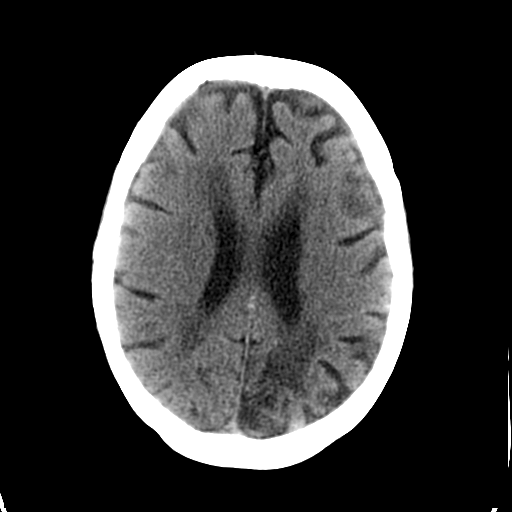
[im 19/36  bone]
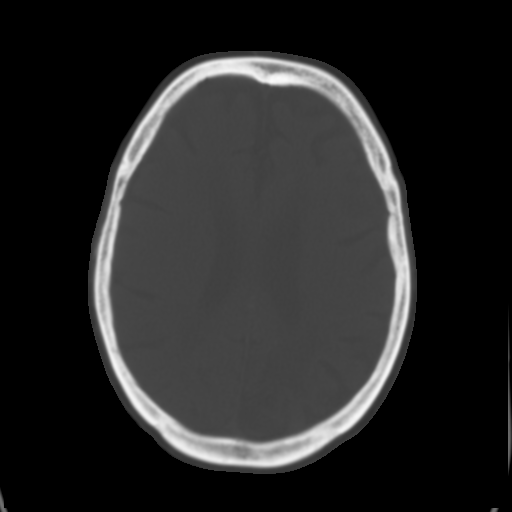
[im 21/36  brain]
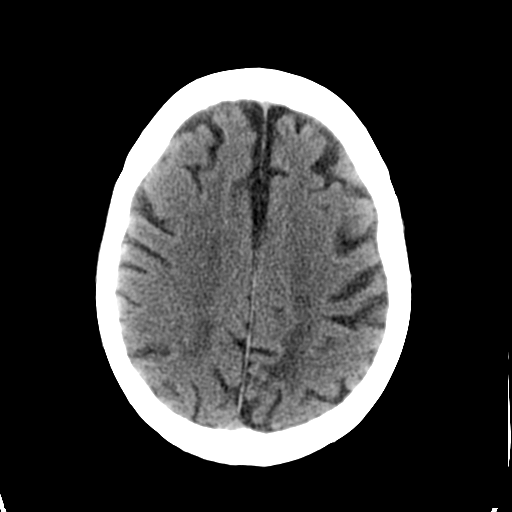
[im 23/36  brain]
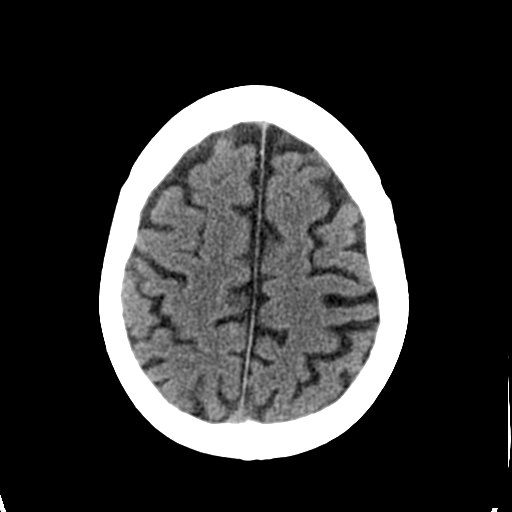
[im 26/36  brain]
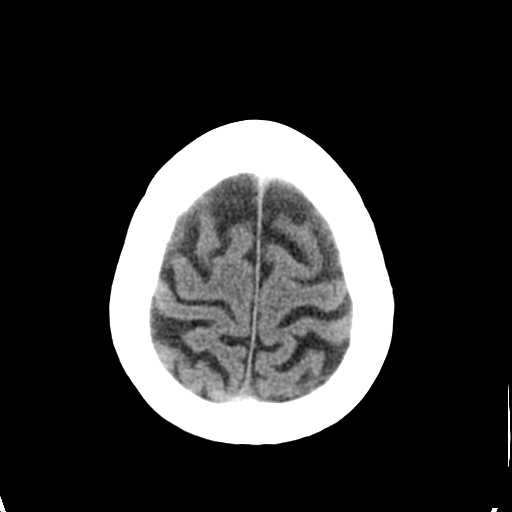
[im 27/36  brain]
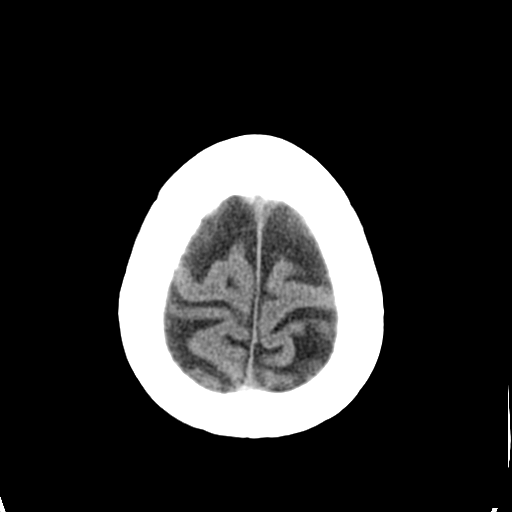
[im 27/36  bone]
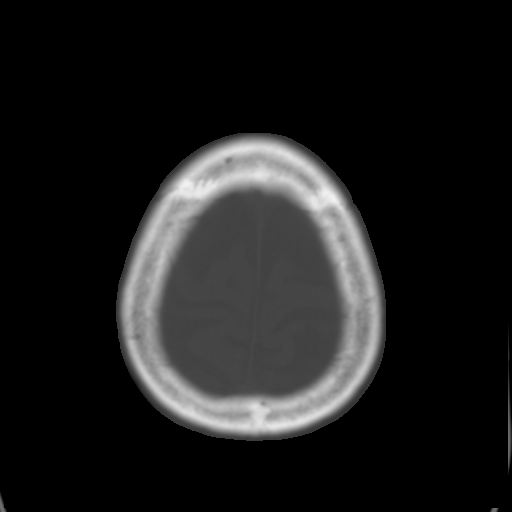
[im 29/36  brain]
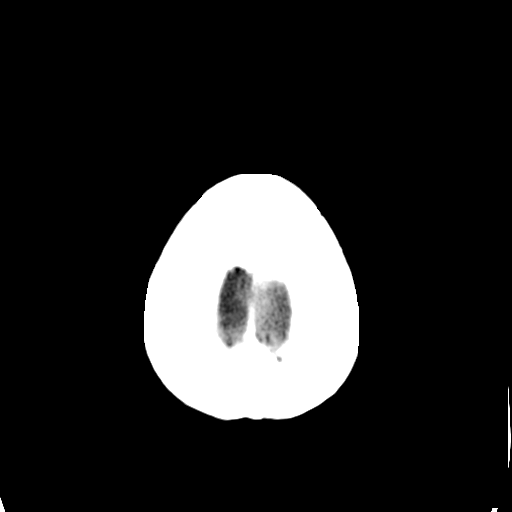
[im 32/36  brain]
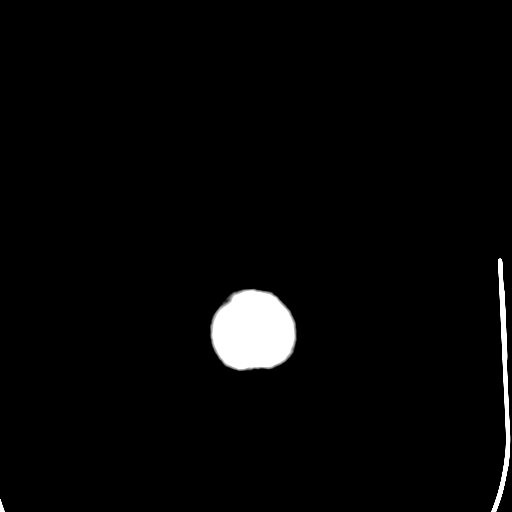
[im 34/36  brain]
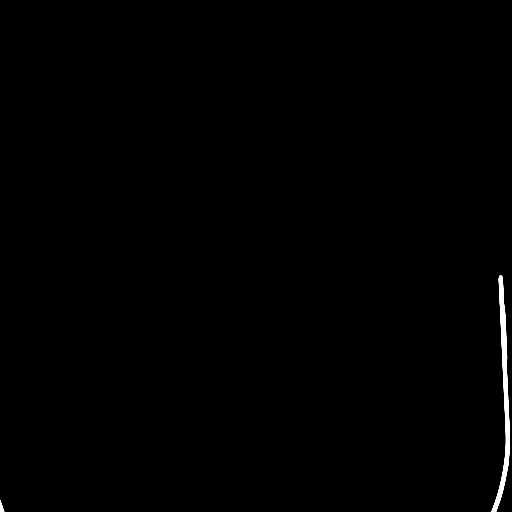

[16 of 30 positions shown; findings below may reference images not displayed]

FINDINGS: No skull fracture.  Paranasal sinuses well aerated.

No intracranial hemorrhage or extra-axial fluid. Stable atrophy and
white matter disease consistent with chronic small vessel ischemia.
Encephalomalacia left occipital lobe consistent with prior infarct,
stable.
IMPRESSION: Chronic abnormalities with no change since prior study. No acute
findings.

## 2016-03-02 IMAGING — DX DG ABDOMEN 1V
1 series · 1 of 1 positions shown · non-contrast
Comparison: None.

CLINICAL DATA: Urinary retention

EXAM:
ABDOMEN - 1 VIEW

[abdomen supine]
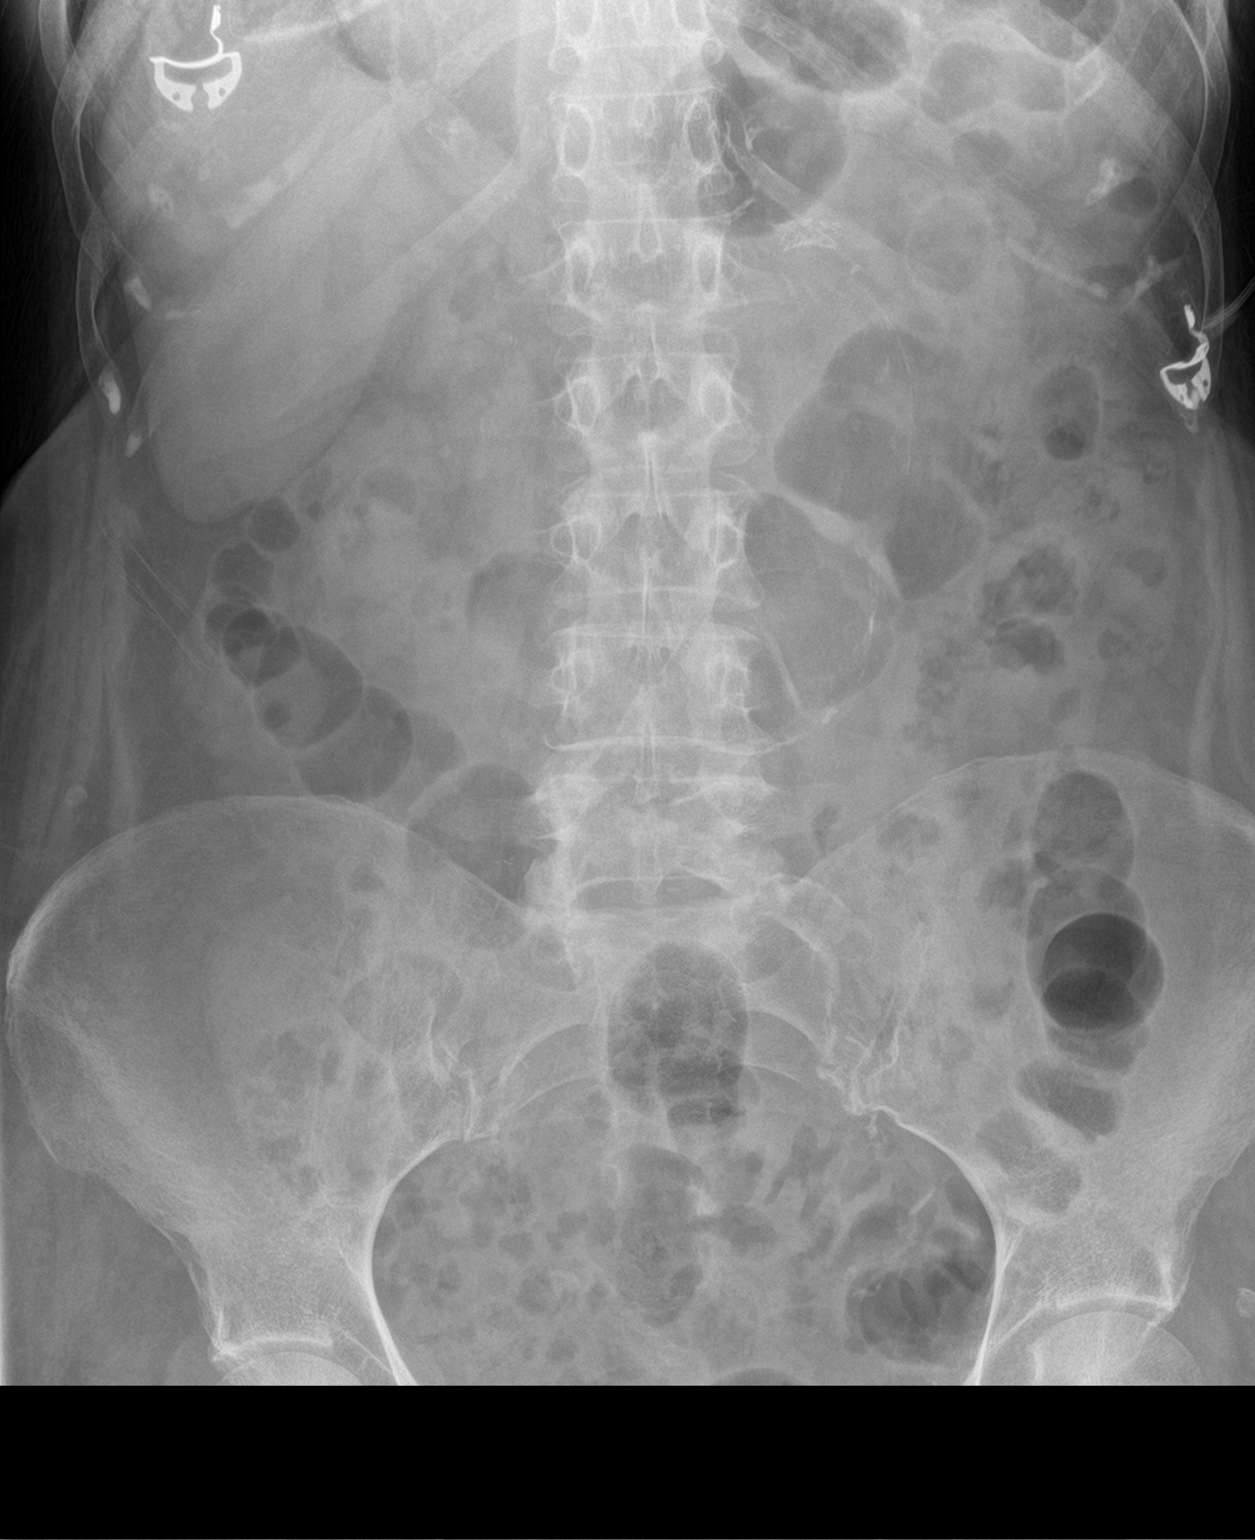

[1 of 1 positions shown; findings below may reference images not displayed]

FINDINGS: Scattered large and small bowel gas is noted. Tortuosity and
aneurysmal dilatation of the abdominal aorta is again noted.
Scattered diverticula are noted. No free air is noted. No
significant soft tissue density in the pelvis is noted to suggest an
over distended bladder. No bony abnormality is seen.
IMPRESSION: No acute abnormality noted.

## 2016-03-03 IMAGING — CR DG CHEST 1V PORT
1 series · 1 of 1 positions shown · non-contrast
Comparison: 10/01/2014

CLINICAL DATA: Weakness and shortness of Breath

EXAM:
PORTABLE CHEST - 1 VIEW

[portable]
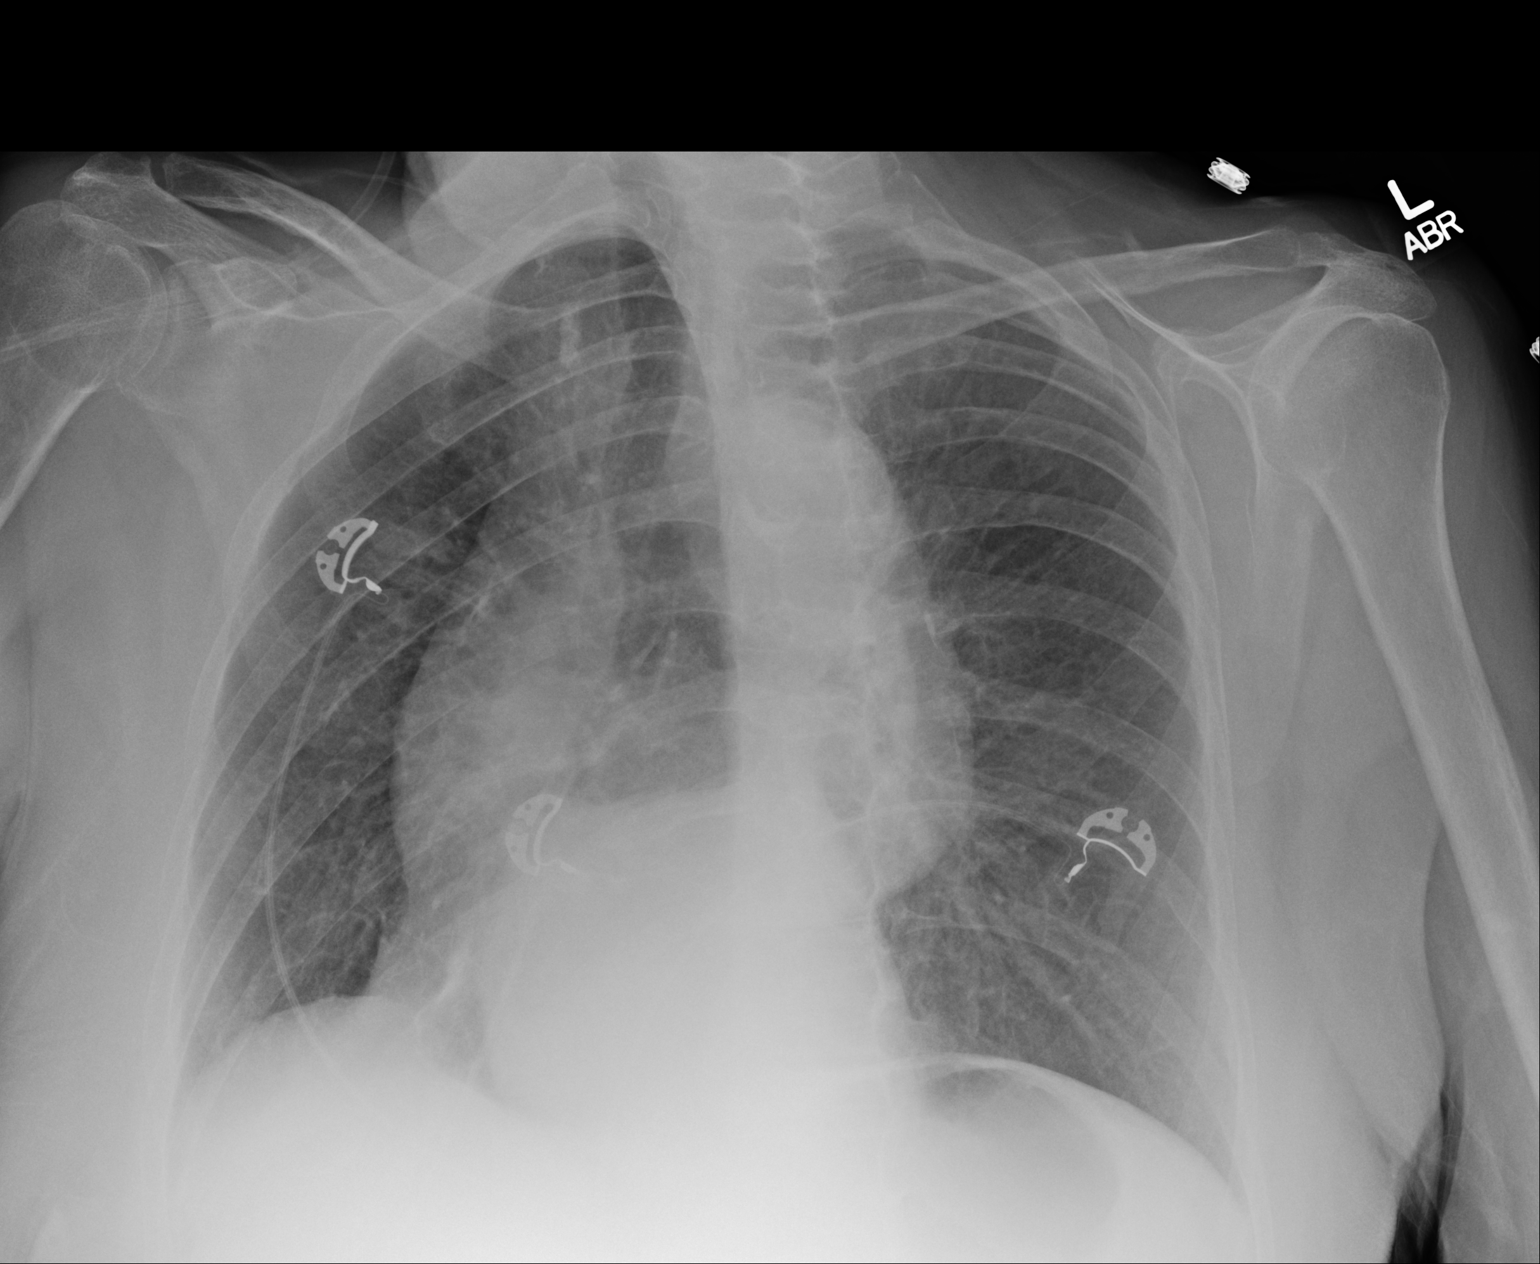

[1 of 1 positions shown; findings below may reference images not displayed]

FINDINGS: There is rotational artifact. Aortic tortuosity is identified as
well as calcified atherosclerotic change. Heart size is normal.
There is no pleural effusion or edema. No airspace consolidation
noted.
IMPRESSION: 1. No acute cardiopulmonary abnormalities.
2. Atherosclerotic disease and aortic tortuosity.
